# Patient Record
Sex: Female | Born: 1960 | Race: Black or African American | Hispanic: No | Marital: Married | Smoking: Never smoker
Health system: Southern US, Community
[De-identification: ages and names within clinical notes are randomized; demographics above are authoritative.]

## PROBLEM LIST (undated history)

## (undated) DIAGNOSIS — M1990 Unspecified osteoarthritis, unspecified site: Secondary | ICD-10-CM

## (undated) DIAGNOSIS — I2699 Other pulmonary embolism without acute cor pulmonale: Secondary | ICD-10-CM

## (undated) DIAGNOSIS — M25569 Pain in unspecified knee: Secondary | ICD-10-CM

## (undated) DIAGNOSIS — E669 Obesity, unspecified: Secondary | ICD-10-CM

## (undated) DIAGNOSIS — J9691 Respiratory failure, unspecified with hypoxia: Secondary | ICD-10-CM

## (undated) DIAGNOSIS — J45909 Unspecified asthma, uncomplicated: Secondary | ICD-10-CM

## (undated) DIAGNOSIS — I10 Essential (primary) hypertension: Secondary | ICD-10-CM

## (undated) DIAGNOSIS — D649 Anemia, unspecified: Secondary | ICD-10-CM

## (undated) DIAGNOSIS — E662 Morbid (severe) obesity with alveolar hypoventilation: Secondary | ICD-10-CM

## (undated) HISTORY — DX: Pain in unspecified knee: M25.569

## (undated) HISTORY — PX: TONSILLECTOMY: SUR1361

## (undated) HISTORY — DX: Obesity, unspecified: E66.9

---

## 2009-11-20 ENCOUNTER — Emergency Department (HOSPITAL_COMMUNITY): Admission: EM | Admit: 2009-11-20 | Discharge: 2009-11-20 | Payer: Self-pay | Admitting: Emergency Medicine

## 2011-04-05 ENCOUNTER — Emergency Department (HOSPITAL_COMMUNITY)
Admission: EM | Admit: 2011-04-05 | Discharge: 2011-04-05 | Disposition: A | Discharge status: Home / Self Care | Payer: Self-pay | Attending: Emergency Medicine | Admitting: Emergency Medicine

## 2011-04-05 DIAGNOSIS — H109 Unspecified conjunctivitis: Secondary | ICD-10-CM | POA: Insufficient documentation

## 2011-04-05 DIAGNOSIS — I10 Essential (primary) hypertension: Secondary | ICD-10-CM | POA: Insufficient documentation

## 2011-04-05 HISTORY — DX: Essential (primary) hypertension: I10

## 2011-04-05 MED ORDER — GENTAMICIN SULFATE 0.3 % OP SOLN: 2.0000 [drp] | Freq: Four times a day (QID) | OPHTHALMIC | Status: AC

## 2011-04-05 NOTE — ED Provider Notes (Signed)
History   This chart was scribed for Emma Lyons, MD by Clarita Crane. The patient was seen in room APFT21/APFT21 and the patient's care was started at 9:49PM.   CSN: [id number] Arrival date & time: 04/05/2011  9:36 PM   First MD Initiated Contact with Patient 04/05/11 2147      Chief Complaint  Patient presents with  . Eye Pain    (Consider location/radiation/quality/duration/timing/severity/associated sxs/prior treatment) HPI Pt comes in with right eye pain with swelling, itching and blurred vision that started one day ago. Pt has watery eye drainage. Pt wears glasses and has past history of conjunctivitis that was treated with penicillin. Denies HA, fever, chills, n/v/d. Denies h/o diabetes. Reports HTN.  Past Medical History  Diagnosis Date  . Hypertension     History reviewed. No pertinent past surgical history.  No family history on file.  History  Substance Use Topics  . Smoking status: Never Smoker   . Smokeless tobacco: Not on file  . Alcohol Use: No    OB History    Grav Para Term Preterm Abortions TAB SAB Ect Mult Living                  Review of Systems 10 Systems reviewed and are negative for acute change except as noted in the HPI.  Allergies  Review of patient's allergies indicates no known allergies.  Home Medications  No current outpatient prescriptions on file.  BP 181/107  Pulse 67  Temp(Src) 97.6 F (36.4 C) (Oral)  Resp 16  Ht 5\' 2"  (1.575 m)  Wt 231 lb 1.6 oz (104.826 kg)  BMI 42.27 kg/m2  SpO2 100%  LMP 03/05/2011  Physical Exam  Nursing note and vitals reviewed. Constitutional: She is oriented to person, place, and time. She appears well-developed and well-nourished. No distress.  HENT:  Head: Normocephalic and atraumatic.  Eyes: EOM are normal. Pupils are equal, round, and reactive to light. Left eye exhibits discharge.       Right eye conjunctiva injected. Cornea clear. Mild watery drainage noted. Anterior chamber clear.     Neck: Neck supple. No tracheal deviation present.  Cardiovascular: Normal rate.   Pulmonary/Chest: Effort normal. No respiratory distress.  Abdominal: She exhibits no distension.  Musculoskeletal: Normal range of motion. She exhibits no edema.  Neurological: She is alert and oriented to person, place, and time. No sensory deficit.  Skin: Skin is warm and dry.  Psychiatric: She has a normal mood and affect. Her behavior is normal.    ED Course  Procedures (including critical care time)  DIAGNOSTIC STUDIES: Oxygen Saturation is 100% on room air, normal by my interpretation.    COORDINATION OF CARE:    Labs Reviewed - No data to display No results found.   No diagnosis found.    MDM  Looks like mild cellulitis.  Will give drops, return prn.      I personally performed the services described in this documentation, which was scribed in my presence. The recorded information has been reviewed and considered.      Emma Lyons, MD 04/05/11 2200

## 2011-04-05 NOTE — ED Notes (Signed)
Pt presents with right eye pain and minimal swelling. No drainage noted at this time. Pt states "I can hardly see out of it".

## 2011-04-05 NOTE — ED Notes (Signed)
Right eye with blurry vision, pt states feels like something in it, mild swelling noted to right eye, + watery, denies any yellow or green drainage

## 2011-04-05 NOTE — ED Notes (Signed)
Left eye 20/50 and right eye 20/200

## 2011-04-05 NOTE — ED Notes (Signed)
MD at bedside. 

## 2012-05-26 ENCOUNTER — Emergency Department (HOSPITAL_COMMUNITY): Payer: Worker's Compensation

## 2012-05-26 ENCOUNTER — Emergency Department (HOSPITAL_COMMUNITY)
Admission: EM | Admit: 2012-05-26 | Discharge: 2012-05-26 | Disposition: A | Discharge status: Home / Self Care | Payer: Worker's Compensation | Attending: Emergency Medicine | Admitting: Emergency Medicine

## 2012-05-26 ENCOUNTER — Encounter (HOSPITAL_COMMUNITY): Payer: Self-pay | Admitting: *Deleted

## 2012-05-26 DIAGNOSIS — I10 Essential (primary) hypertension: Secondary | ICD-10-CM | POA: Insufficient documentation

## 2012-05-26 DIAGNOSIS — Y9389 Activity, other specified: Secondary | ICD-10-CM | POA: Insufficient documentation

## 2012-05-26 DIAGNOSIS — S6990XA Unspecified injury of unspecified wrist, hand and finger(s), initial encounter: Secondary | ICD-10-CM

## 2012-05-26 DIAGNOSIS — W319XXA Contact with unspecified machinery, initial encounter: Secondary | ICD-10-CM | POA: Insufficient documentation

## 2012-05-26 DIAGNOSIS — Y9289 Other specified places as the place of occurrence of the external cause: Secondary | ICD-10-CM | POA: Insufficient documentation

## 2012-05-26 DIAGNOSIS — Y990 Civilian activity done for income or pay: Secondary | ICD-10-CM | POA: Insufficient documentation

## 2012-05-26 DIAGNOSIS — S6710XA Crushing injury of unspecified finger(s), initial encounter: Secondary | ICD-10-CM | POA: Insufficient documentation

## 2012-05-26 DIAGNOSIS — S61209A Unspecified open wound of unspecified finger without damage to nail, initial encounter: Secondary | ICD-10-CM | POA: Insufficient documentation

## 2012-05-26 MED ORDER — IBUPROFEN 800 MG PO TABS: 800.0000 mg | ORAL_TABLET | Freq: Three times a day (TID) | ORAL | Status: DC

## 2012-05-26 MED ORDER — HYDROCODONE-ACETAMINOPHEN 5-325 MG PO TABS
1.0000 | ORAL_TABLET | Freq: Once | ORAL | Status: AC
  Administered 2012-05-26: 1 via ORAL
  Filled 2012-05-26: qty 1

## 2012-05-26 MED ORDER — IBUPROFEN 800 MG PO TABS
800.0000 mg | ORAL_TABLET | Freq: Once | ORAL | Status: AC
  Administered 2012-05-26: 800 mg via ORAL
  Filled 2012-05-26: qty 1

## 2012-05-26 NOTE — ED Notes (Signed)
Pt reports getting finger caught in machine belt at work approximately 1 hour ago. Laceration noted to ring finger of left hand.  Bleeding controlled at this time.  Pt reports last tetanus shot in last 2 years.

## 2012-05-26 NOTE — ED Provider Notes (Signed)
History     CSN: [id number]  Arrival date & time 05/26/12  0054   First MD Initiated Contact with Patient 05/26/12 0236      Chief Complaint  Patient presents with  . Finger Injury    (Consider location/radiation/quality/duration/timing/severity/associated sxs/prior treatment) HPI LISVET Emma Morris is a 52 y.o. female who presents to the Emergency Department complaining of right ring  finger injury while at work.She caught the finger in a press crushing the end of the right 4th finger. Circumferential shallow laceration to the tip of the finger, suturing  not appropriate.Tetanus up to date 2 years ago. Has taken no medicines.  Past Medical History  Diagnosis Date  . Hypertension     History reviewed. No pertinent past surgical history.  History reviewed. No pertinent family history.  History  Substance Use Topics  . Smoking status: Never Smoker   . Smokeless tobacco: Not on file  . Alcohol Use: No    OB History    Grav Para Term Preterm Abortions TAB SAB Ect Mult Living                  Review of Systems  Constitutional: Negative for fever.       10 Systems reviewed and are negative for acute change except as noted in the HPI.  HENT: Negative for congestion.   Eyes: Negative for discharge and redness.  Respiratory: Negative for cough and shortness of breath.   Cardiovascular: Negative for chest pain.  Gastrointestinal: Negative for vomiting and abdominal pain.  Musculoskeletal: Negative for back pain.       Right 4th finger injury  Skin: Negative for rash.  Neurological: Negative for syncope, numbness and headaches.  Psychiatric/Behavioral:       No behavior change.    Allergies  Review of patient's allergies indicates no known allergies.  Home Medications   Current Outpatient Rx  Name  Route  Sig  Dispense  Refill  . CLONIDINE HCL 0.2 MG PO TABS   Oral   Take 0.2 mg by mouth daily.             BP 183/103  Pulse 86  Temp 98.1 F (36.7 C)  (Oral)  Resp 18  Ht 5\' 2"  (1.575 m)  Wt 189 lb (85.73 kg)  BMI 34.57 kg/m2  SpO2 99%  LMP 05/01/2012  Physical Exam  Nursing note and vitals reviewed. Constitutional: She appears well-developed and well-nourished.       Awake, alert, nontoxic appearance.  HENT:  Head: Atraumatic.  Eyes: Right eye exhibits no discharge. Left eye exhibits no discharge.  Neck: Neck supple.  Cardiovascular: Normal heart sounds.   Pulmonary/Chest: Effort normal and breath sounds normal. She exhibits no tenderness.  Abdominal: Soft. There is no tenderness. There is no rebound.  Musculoskeletal: She exhibits no tenderness.       Hands:      Baseline ROM, no obvious new focal weakness.Right 4th finger with a circular laceration to the tip end of the finger associated with a crush injury.   Neurological:       Mental status and motor strength appears baseline for patient and situation.  Skin: No rash noted.  Psychiatric: She has a normal mood and affect.    ED Course  Procedures (including critical care time)  Dg Finger Ring Right  05/26/2012  *RADIOLOGY REPORT*  Clinical Data: Injury, pain.  RIGHT RING FINGER 2+V  Comparison: None.  Findings: Imaged bones, joints and soft tissues appear normal.  IMPRESSION: Normal study.   Original Report Authenticated By: Holley Dexter, M.D.        MDM  Patient presents with crush inury to right 4th finger with a circular laceration to the tip end of the finger that does not require repair. Bulky wrap applied. [employer] paperwork completed. Pt stable in ED with no significant deterioration in condition.The patient appears reasonably screened and/or stabilized for discharge and I doubt any other medical condition or other [facility] requiring further screening, evaluation, or treatment in the ED at this time prior to discharge.  MDM Reviewed: nursing note and vitals Interpretation: x-ray           Nicoletta Dress. Colon Branch, MD 05/26/12 1610

## 2012-05-26 NOTE — ED Notes (Signed)
Finger cleaned w/ shur clens. Bulky dressing applied as per EDP.

## 2013-05-02 ENCOUNTER — Emergency Department (HOSPITAL_COMMUNITY): Payer: BC Managed Care – PPO

## 2013-05-02 ENCOUNTER — Encounter (HOSPITAL_COMMUNITY): Payer: Self-pay | Admitting: Emergency Medicine

## 2013-05-02 ENCOUNTER — Inpatient Hospital Stay (HOSPITAL_COMMUNITY)
Admission: EM | Admit: 2013-05-02 | Discharge: 2013-05-04 | DRG: 743 | Disposition: A | Discharge status: Home / Self Care | Payer: BC Managed Care – PPO | Attending: Obstetrics & Gynecology | Admitting: Obstetrics & Gynecology

## 2013-05-02 ENCOUNTER — Observation Stay (HOSPITAL_COMMUNITY): Payer: BC Managed Care – PPO

## 2013-05-02 DIAGNOSIS — N946 Dysmenorrhea, unspecified: Secondary | ICD-10-CM | POA: Diagnosis present

## 2013-05-02 DIAGNOSIS — D500 Iron deficiency anemia secondary to blood loss (chronic): Secondary | ICD-10-CM | POA: Diagnosis present

## 2013-05-02 DIAGNOSIS — N939 Abnormal uterine and vaginal bleeding, unspecified: Secondary | ICD-10-CM | POA: Diagnosis present

## 2013-05-02 DIAGNOSIS — N921 Excessive and frequent menstruation with irregular cycle: Secondary | ICD-10-CM | POA: Diagnosis present

## 2013-05-02 DIAGNOSIS — Z9889 Other specified postprocedural states: Secondary | ICD-10-CM

## 2013-05-02 DIAGNOSIS — I10 Essential (primary) hypertension: Secondary | ICD-10-CM | POA: Diagnosis present

## 2013-05-02 DIAGNOSIS — R109 Unspecified abdominal pain: Secondary | ICD-10-CM

## 2013-05-02 DIAGNOSIS — N920 Excessive and frequent menstruation with regular cycle: Principal | ICD-10-CM | POA: Diagnosis present

## 2013-05-02 DIAGNOSIS — K8020 Calculus of gallbladder without cholecystitis without obstruction: Secondary | ICD-10-CM

## 2013-05-02 DIAGNOSIS — D649 Anemia, unspecified: Secondary | ICD-10-CM

## 2013-05-02 LAB — ABO/RH: ABO/RH(D): O POS

## 2013-05-02 LAB — PREPARE RBC (CROSSMATCH)

## 2013-05-02 LAB — COMPREHENSIVE METABOLIC PANEL
Calcium: 8.2 mg/dL — ABNORMAL LOW (ref 8.4–10.5)
Creatinine, Ser: 0.8 mg/dL (ref 0.50–1.10)
GFR calc Af Amer: >90 mL/min (ref >90)
GFR calc non Af Amer: 83 mL/min — ABNORMAL LOW (ref >90)
Glucose, Bld: 97 mg/dL (ref 70–99)
Potassium: 3.2 mEq/L — ABNORMAL LOW (ref 3.5–5.1)
Sodium: 134 mEq/L — ABNORMAL LOW (ref 135–145)
Total Bilirubin: 0.2 mg/dL — ABNORMAL LOW (ref 0.3–1.2)
Total Protein: 6.8 g/dL (ref 6.0–8.3)

## 2013-05-02 LAB — URINALYSIS, ROUTINE W REFLEX MICROSCOPIC
Bilirubin Urine: NEGATIVE
Glucose, UA: NEGATIVE mg/dL
Hgb urine dipstick: NEGATIVE
Leukocytes, UA: NEGATIVE
Protein, ur: NEGATIVE mg/dL
Specific Gravity, Urine: 1.025 (ref 1.005–1.030)
Urobilinogen, UA: 0.2 mg/dL (ref 0.0–1.0)
pH: 6 (ref 5.0–8.0)

## 2013-05-02 LAB — CBC WITH DIFFERENTIAL/PLATELET
Basophils Absolute: 0 10*3/uL (ref 0.0–0.1)
Basophils Relative: 0 % (ref 0–1)
Eosinophils Relative: 4 % (ref 0–5)
HCT: 15.7 % — ABNORMAL LOW (ref 36.0–46.0)
MCH: 23.6 pg — ABNORMAL LOW (ref 26.0–34.0)
MCV: 78.9 fL (ref 78.0–100.0)
Monocytes Relative: 8 % (ref 3–12)
Platelets: 291 10*3/uL (ref 150–400)
RBC: 1.99 MIL/uL — ABNORMAL LOW (ref 3.87–5.11)
RDW: 16.6 % — ABNORMAL HIGH (ref 11.5–15.5)
WBC: 7.4 10*3/uL (ref 4.0–10.5)

## 2013-05-02 LAB — CBC
HCT: 23.3 % — ABNORMAL LOW (ref 36.0–46.0)
Hemoglobin: 7.5 g/dL — ABNORMAL LOW (ref 12.0–15.0)
MCH: 26 pg (ref 26.0–34.0)
MCHC: 32.2 g/dL (ref 30.0–36.0)
RBC: 2.88 MIL/uL — ABNORMAL LOW (ref 3.87–5.11)

## 2013-05-02 LAB — PRO B NATRIURETIC PEPTIDE: Pro B Natriuretic peptide (BNP): 90.2 pg/mL (ref 0–125)

## 2013-05-02 LAB — LIPASE, BLOOD: Lipase: 22 U/L (ref 11–59)

## 2013-05-02 MED ORDER — KCL IN DEXTROSE-NACL 40-5-0.45 MEQ/L-%-% IV SOLN
INTRAVENOUS | Status: DC
  Administered 2013-05-02 – 2013-05-03 (×3): via INTRAVENOUS

## 2013-05-02 MED ORDER — FUROSEMIDE 10 MG/ML IJ SOLN
20.0000 mg | Freq: Once | INTRAMUSCULAR | Status: AC
  Administered 2013-05-02: 20 mg via INTRAVENOUS
  Filled 2013-05-02: qty 2

## 2013-05-02 MED ORDER — ONDANSETRON HCL 4 MG/2ML IJ SOLN
4.0000 mg | Freq: Once | INTRAMUSCULAR | Status: AC
  Administered 2013-05-02: 4 mg via INTRAVENOUS
  Filled 2013-05-02: qty 2

## 2013-05-02 MED ORDER — HYDROMORPHONE HCL PF 1 MG/ML IJ SOLN
0.5000 mg | Freq: Once | INTRAMUSCULAR | Status: AC
  Administered 2013-05-02: 0.5 mg via INTRAVENOUS
  Filled 2013-05-02: qty 1

## 2013-05-02 MED ORDER — ACETAMINOPHEN 325 MG PO TABS
650.0000 mg | ORAL_TABLET | Freq: Once | ORAL | Status: AC
  Administered 2013-05-02: 650 mg via ORAL
  Filled 2013-05-02: qty 2

## 2013-05-02 MED ORDER — IOHEXOL 300 MG/ML  SOLN
100.0000 mL | Freq: Once | INTRAMUSCULAR | Status: AC | PRN
  Administered 2013-05-02: 100 mL via INTRAVENOUS

## 2013-05-02 MED ORDER — IOHEXOL 300 MG/ML  SOLN
50.0000 mL | Freq: Once | INTRAMUSCULAR | Status: AC | PRN
  Administered 2013-05-02: 50 mL via ORAL

## 2013-05-02 MED ORDER — SODIUM CHLORIDE 0.9 % IV SOLN
INTRAVENOUS | Status: AC
  Administered 2013-05-02: 11:00:00 via INTRAVENOUS

## 2013-05-02 MED ORDER — SODIUM CHLORIDE 0.9 % IV SOLN
INTRAVENOUS | Status: DC
  Administered 2013-05-02: 1000 mL via INTRAVENOUS

## 2013-05-02 MED ORDER — MEGESTROL ACETATE 40 MG PO TABS
120.0000 mg | ORAL_TABLET | Freq: Every day | ORAL | Status: DC
  Administered 2013-05-02 – 2013-05-03 (×2): 120 mg via ORAL
  Filled 2013-05-02 (×6): qty 3

## 2013-05-02 NOTE — Progress Notes (Signed)
Pt has been drowsy but arousable this afternoon. Woke pt up about three times while she was receiving blood and asked her orientation questions. Pt was able to answer all questions correctly. Will continue to monitor.

## 2013-05-02 NOTE — ED Provider Notes (Addendum)
CSN: [id number]     Arrival date & time 05/02/13  0207 History   First MD Initiated Contact with Patient 05/02/13 0236     Chief Complaint  Patient presents with  . Back Pain  . Leg Swelling   (Consider location/radiation/quality/duration/timing/severity/associated sxs/prior Treatment) Patient is a 52 y.o. female presenting with back pain. The history is provided by the patient.  Back Pain Associated symptoms: abdominal pain   Associated symptoms: no chest pain, no dysuria, no fever and no headaches    patient presents with a four-week history of left-sided abdominal pain is suprapubic abdominal pain. No nausea no vomiting associated with shortness of breath. Patient states the pain is 8/10 it has been waxing and waning over the past month. Associated with vaginal bleeding for the past 5 weeks. Patient has been seen at [facility] to clinic for this. Patient denies being started on any hormone manipulation treatment for the vaginal bleeding.  Past Medical History  Diagnosis Date  . Hypertension    History reviewed. No pertinent past surgical history. No family history on file. History  Substance Use Topics  . Smoking status: Never Smoker   . Smokeless tobacco: Not on file  . Alcohol Use: No   OB History   Grav Para Term Preterm Abortions TAB SAB Ect Mult Living                 Review of Systems  Constitutional: Negative for fever.  HENT: Negative for congestion.   Eyes: Negative for redness.  Respiratory: Positive for shortness of breath.   Cardiovascular: Positive for leg swelling. Negative for chest pain.  Gastrointestinal: Positive for abdominal pain.  Genitourinary: Positive for vaginal bleeding. Negative for dysuria.  Musculoskeletal: Positive for back pain.  Skin: Negative for rash.  Neurological: Negative for headaches.  Hematological: Does not bruise/bleed easily.  Psychiatric/Behavioral: Negative for confusion.    Allergies  Review of patient's allergies indicates no  known allergies.  Home Medications   Current Outpatient Rx  Name  Route  Sig  Dispense  Refill  . cloNIDine (CATAPRES) 0.2 MG tablet   Oral   Take 0.2 mg by mouth daily.           Marland Kitchen ibuprofen (ADVIL,MOTRIN) 800 MG tablet   Oral   Take 1 tablet (800 mg total) by mouth 3 (three) times daily.   21 tablet   0    BP 138/75  Pulse 99  Temp(Src) 98.3 F (36.8 C) (Oral)  Resp 16  Ht 5\' 2"  (1.575 m)  Wt 251 lb (113.853 kg)  BMI 45.90 kg/m2  SpO2 100%  LMP 04/24/2013 Physical Exam  Nursing note and vitals reviewed. Constitutional: She is oriented to person, place, and time. She appears well-developed and well-nourished.  HENT:  Head: Normocephalic and atraumatic.  Mouth/Throat: Oropharynx is clear and moist.  Eyes: Conjunctivae and EOM are normal. Pupils are equal, round, and reactive to light.  Cardiovascular: Normal rate, regular rhythm and normal heart sounds.   No murmur heard. Pulmonary/Chest: Effort normal and breath sounds normal. No respiratory distress.  Abdominal: Bowel sounds are normal. There is no tenderness.  Genitourinary:  Vaginal bleeding.  Musculoskeletal: Normal range of motion. She exhibits edema.  Neurological: She is alert and oriented to person, place, and time. No cranial nerve deficit. She exhibits normal muscle tone. Coordination normal.  Skin: No rash noted.    ED Course  Procedures (including critical care time) Labs Review Labs Reviewed  COMPREHENSIVE METABOLIC PANEL - Abnormal; Notable  for the following:    Sodium 134 (*)    Potassium 3.2 (*)    Calcium 8.2 (*)    Albumin 3.0 (*)    Total Bilirubin 0.2 (*)    GFR calc non Af Amer 83 (*)    All other components within normal limits  CBC WITH DIFFERENTIAL - Abnormal; Notable for the following:    RBC 1.99 (*)    Hemoglobin 4.7 (*)    HCT 15.7 (*)    MCH 23.6 (*)    MCHC 29.9 (*)    RDW 16.6 (*)    All other components within normal limits  LIPASE, BLOOD  URINALYSIS, ROUTINE W  REFLEX MICROSCOPIC  PRO B NATRIURETIC PEPTIDE  TROPONIN I  TYPE AND SCREEN  PREPARE RBC (CROSSMATCH)  ABO/RH   Results for orders placed during the hospital encounter of 05/02/13  COMPREHENSIVE METABOLIC PANEL      Result Value Range   Sodium 134 (*) 135 - 145 mEq/L   Potassium 3.2 (*) 3.5 - 5.1 mEq/L   Chloride 99  96 - 112 mEq/L   CO2 27  19 - 32 mEq/L   Glucose, Bld 97  70 - 99 mg/dL   BUN 9  6 - 23 mg/dL   Creatinine, Ser 3.08  0.50 - 1.10 mg/dL   Calcium 8.2 (*) 8.4 - 10.5 mg/dL   Total Protein 6.8  6.0 - 8.3 g/dL   Albumin 3.0 (*) 3.5 - 5.2 g/dL   AST 8  0 - 37 U/L   ALT 9  0 - 35 U/L   Alkaline Phosphatase 39  39 - 117 U/L   Total Bilirubin 0.2 (*) 0.3 - 1.2 mg/dL   GFR calc non Af Amer 83 (*) >90 mL/min   GFR calc Af Amer >90  >90 mL/min  LIPASE, BLOOD      Result Value Range   Lipase 22  11 - 59 U/L  CBC WITH DIFFERENTIAL      Result Value Range   WBC 7.4  4.0 - 10.5 K/uL   RBC 1.99 (*) 3.87 - 5.11 MIL/uL   Hemoglobin 4.7 (*) 12.0 - 15.0 g/dL   HCT 65.7 (*) 84.6 - 96.2 %   MCV 78.9  78.0 - 100.0 fL   MCH 23.6 (*) 26.0 - 34.0 pg   MCHC 29.9 (*) 30.0 - 36.0 g/dL   RDW 95.2 (*) 84.1 - 32.4 %   Platelets 291  150 - 400 K/uL   Neutrophils Relative % 65  43 - 77 %   Neutro Abs 4.9  1.7 - 7.7 K/uL   Lymphocytes Relative 23  12 - 46 %   Lymphs Abs 1.7  0.7 - 4.0 K/uL   Monocytes Relative 8  3 - 12 %   Monocytes Absolute 0.6  0.1 - 1.0 K/uL   Eosinophils Relative 4  0 - 5 %   Eosinophils Absolute 0.3  0.0 - 0.7 K/uL   Basophils Relative 0  0 - 1 %   Basophils Absolute 0.0  0.0 - 0.1 K/uL  URINALYSIS, ROUTINE W REFLEX MICROSCOPIC      Result Value Range   Color, Urine YELLOW  YELLOW   APPearance CLEAR  CLEAR   Specific Gravity, Urine 1.025  1.005 - 1.030   pH 6.0  5.0 - 8.0   Glucose, UA NEGATIVE  NEGATIVE mg/dL   Hgb urine dipstick NEGATIVE  NEGATIVE   Bilirubin Urine NEGATIVE  NEGATIVE   Ketones, ur NEGATIVE  NEGATIVE mg/dL   Protein, ur NEGATIVE  NEGATIVE  mg/dL   Urobilinogen, UA 0.2  0.0 - 1.0 mg/dL   Nitrite NEGATIVE  NEGATIVE   Leukocytes, UA NEGATIVE  NEGATIVE  PRO B NATRIURETIC PEPTIDE      Result Value Range   Pro B Natriuretic peptide (BNP) 90.2  0 - 125 pg/mL  TROPONIN I      Result Value Range   Troponin I <0.30  <0.30 ng/mL  TYPE AND SCREEN      Result Value Range   ABO/RH(D) O POS     Antibody Screen NEG     Sample Expiration 05/05/2013     Unit Number [id number]     Blood Component Type RBC LR PHER2     Unit division 00     Status of Unit ALLOCATED     Transfusion Status OK TO TRANSFUSE     Crossmatch Result Compatible     Unit Number [id number]     Blood Component Type RED CELLS,LR     Unit division 00     Status of Unit ALLOCATED     Transfusion Status OK TO TRANSFUSE     Crossmatch Result Compatible    PREPARE RBC (CROSSMATCH)      Result Value Range   Order Confirmation ORDER PROCESSED BY BLOOD BANK    ABO/RH      Result Value Range   ABO/RH(D) O POS      Imaging Review Dg Chest 2 View  05/02/2013   CLINICAL DATA:  Back pain and leg swelling.  EXAM: CHEST  2 VIEW  COMPARISON:  None available for comparison at time of study interpretation.  FINDINGS: The cardiac silhouette appears mildly enlarged, mediastinal silhouette is unremarkable. Low inspiratory examination with crowded vasculature markings, no superimposed pleural effusions or focal consolidations. No pneumothorax. Large body habitus. Osseous structures are nonsuspicious.  IMPRESSION: Mild cardiomegaly, no acute pulmonary process for this low inspiratory examination.   Electronically Signed   By: Awilda Metro   On: 05/02/2013 06:36   Ct Abdomen Pelvis W Contrast  05/02/2013   CLINICAL DATA:  Left abdominal pain with legs  EXAM: CT ABDOMEN AND PELVIS WITH CONTRAST  TECHNIQUE: Multidetector CT imaging of the abdomen and pelvis was performed using the standard protocol following bolus administration of intravenous contrast.  CONTRAST:  50mL  OMNIPAQUE IOHEXOL 300 MG/ML SOLN, OMNIPAQUE IOHEXOL 300 MG/ML SOLN  COMPARISON:  None.  FINDINGS: BODY WALL: Unremarkable.  LOWER CHEST: Probable mild air trapping at the bases were there is mosaic attenuation.  ABDOMEN/PELVIS:  Liver: No focal abnormality.  Biliary: Gallstones without evidence of acute cholecystitis.  Pancreas: Unremarkable.  Spleen: Unremarkable.  Adrenals: Unremarkable.  Kidneys and ureters: No hydronephrosis or stone.  Bladder: Unremarkable.  Reproductive: The right ovary is larger than the left, measuring 3 x 3.8 by 3.8 cm. No discrete cyst or mass seen.  Bowel: The colon is moderately distended at multiple locations with gas and stool. The sigmoid colon is redundant and partially twisted, without current volvulus or obstruction. No pericecal inflammation.  Retroperitoneum: No mass or adenopathy.  Peritoneum: No free fluid or gas.  Vascular: No acute abnormality.  OSSEOUS: No acute abnormalities.  IMPRESSION: 1. No acute intra-abdominal findings. 2. Gas and stool-filled colon which could be a source of left lower quadrant pain. Redundant, tortuous sigmoid colon which places the patient at risk for volvulus. 3. Cholelithiasis without acute cholecystitis. 4. Prominent right ovarian size for age, 3 x 4 x 4  cm. If the patient is postmenopausal, recommend outpatient followup sonography.   Electronically Signed   By: Tiburcio Pea M.D.   On: 05/02/2013 06:50    EKG Interpretation    Date/Time:  Wednesday May 02 2013 03:04:48 EST Ventricular Rate:  90 PR Interval:  148 QRS Duration: 72 QT Interval:  366 QTC Calculation: 447 R Axis:   5 Text Interpretation:  Normal sinus rhythm Normal ECG No previous ECGs available Confirmed by Yanky Vanderburg  MD, Aleister Lady ([id number]) on 05/02/2013 3:37:50 AM           CRITICAL CARE Performed by: Shelda Jakes. Total critical care time: 30 Critical care time was exclusive of separately billable procedures and treating other  patients. Critical care was necessary to treat or prevent imminent or life-threatening deterioration. Critical care was time spent personally by me on the following activities: development of treatment plan with patient and/or surrogate as well as nursing, discussions with consultants, evaluation of patient's response to treatment, examination of patient, obtaining history from patient or surrogate, ordering and performing treatments and interventions, ordering and review of laboratory studies, ordering and review of radiographic studies, pulse oximetry and re-evaluation of patient's condition.  MDM   1. Abdominal pain   2. Anemia   3. Vaginal bleeding   4. Cholelithiasis    Patient with marked anemia. Most likely due to the vaginal bleeding for the past 5 weeks. CT scan done to evaluate intra-abdominal processes and 4 the left abdominal pain.  Patient will require admission for the significant anemia also requires blood transfusion. Transfusion orders begun. Patient may need hormonal manipulation an OB/GYN consultation while in the hospital. If the CT scan is negative would recommend starting Megace at 40 mg. Once a day.    Shelda Jakes, MD 05/02/13 1610  Shelda Jakes, MD 05/02/13 [id number]  Shelda Jakes, MD 05/02/13 [phone]  Addendum: Discuss with Dr. Despina Hidden from OB/GYN. He will admit to MedSurg. We'll plan on probably getting an ultrasound. Admission by hospitalist not required.  Shelda Jakes, MD 05/02/13 [phone]

## 2013-05-02 NOTE — ED Notes (Signed)
Blood ready

## 2013-05-02 NOTE — ED Notes (Signed)
Pt reports left side/lower back pain that has been going on for a month. Pt states her legs are swelling also.

## 2013-05-03 DIAGNOSIS — N921 Excessive and frequent menstruation with irregular cycle: Secondary | ICD-10-CM | POA: Diagnosis present

## 2013-05-03 DIAGNOSIS — N949 Unspecified condition associated with female genital organs and menstrual cycle: Secondary | ICD-10-CM

## 2013-05-03 DIAGNOSIS — D500 Iron deficiency anemia secondary to blood loss (chronic): Secondary | ICD-10-CM

## 2013-05-03 LAB — CBC
HCT: 26.3 % — ABNORMAL LOW (ref 36.0–46.0)
Hemoglobin: 8.5 g/dL — ABNORMAL LOW (ref 12.0–15.0)
MCH: 26.3 pg (ref 26.0–34.0)
MCHC: 32.3 g/dL (ref 30.0–36.0)
MCV: 81.4 fL (ref 78.0–100.0)
Platelets: 247 K/uL (ref 150–400)
RBC: 3.23 MIL/uL — ABNORMAL LOW (ref 3.87–5.11)
RDW: 16.6 % — ABNORMAL HIGH (ref 11.5–15.5)
WBC: 8.8 K/uL (ref 4.0–10.5)

## 2013-05-03 LAB — PREPARE RBC (CROSSMATCH)

## 2013-05-03 MED ORDER — ACETAMINOPHEN 325 MG PO TABS
650.0000 mg | ORAL_TABLET | Freq: Once | ORAL | Status: AC
Start: 2013-05-03 — End: 2013-05-03
  Administered 2013-05-03: 650 mg via ORAL
  Filled 2013-05-03: qty 2

## 2013-05-03 MED ORDER — CEFAZOLIN SODIUM-DEXTROSE 2-3 GM-% IV SOLR
2.0000 g | INTRAVENOUS | Status: AC
  Administered 2013-05-04: 2 g via INTRAVENOUS
  Filled 2013-05-03: qty 50

## 2013-05-03 MED ORDER — CEFAZOLIN (ANCEF) 1 G IV SOLR: 2.0000 g | INTRAVENOUS | Status: DC

## 2013-05-03 MED ORDER — DIPHENHYDRAMINE HCL 25 MG PO CAPS
25.0000 mg | ORAL_CAPSULE | Freq: Once | ORAL | Status: AC
  Administered 2013-05-03: 25 mg via ORAL
  Filled 2013-05-03: qty 1

## 2013-05-03 MED ORDER — KETOROLAC TROMETHAMINE 30 MG/ML IJ SOLN
30.0000 mg | Freq: Once | INTRAMUSCULAR | Status: AC
  Administered 2013-05-04: 30 mg via INTRAVENOUS
  Filled 2013-05-03: qty 1

## 2013-05-03 MED ORDER — FUROSEMIDE 10 MG/ML IJ SOLN
20.0000 mg | Freq: Once | INTRAMUSCULAR | Status: AC
  Administered 2013-05-03: 20 mg via INTRAVENOUS
  Filled 2013-05-03: qty 2

## 2013-05-03 NOTE — Progress Notes (Signed)
Notified MD of hemoglobin.  Will follow MD's orders and give 1 unit of blood and have a CBC completed in the AM.  Will continue to monitor patient.   General Wearing R

## 2013-05-03 NOTE — Progress Notes (Addendum)
Pt has FMLA papers and doctor's note in chart that need to be signed by MD. Dr. Despina Hidden made aware. Her job needs them to be faxed over ASAP. Fax number should be on the forms. Will continue to monitor.

## 2013-05-03 NOTE — Progress Notes (Signed)
Pt. Has been alert and oriented and visiting with family.  Family brought food in for patient and patient ate 100% of her meal.  Pt. States that she has more energy and is feeling better.  Will continue to monitor.  Meria Crilly R

## 2013-05-03 NOTE — Progress Notes (Signed)
UR completed 

## 2013-05-03 NOTE — Progress Notes (Signed)
Subjective: Patient reports feeling better, bleeding lighter.    Objective: I have reviewed patient's vital signs, intake and output, medications and labs.  Results for orders placed during the hospital encounter of 05/02/13 (from the past 24 hour(s))  CBC   Collection Time    05/02/13  8:22 PM      Result Value Range   WBC 8.2  4.0 - 10.5 K/uL   RBC 2.88 (*) 3.87 - 5.11 MIL/uL   Hemoglobin 7.5 (*) 12.0 - 15.0 g/dL   HCT 11.9 (*) 14.7 - 82.9 %   MCV 80.9  78.0 - 100.0 fL   MCH 26.0  26.0 - 34.0 pg   MCHC 32.2  30.0 - 36.0 g/dL   RDW 56.2 (*) 13.0 - 86.5 %   Platelets 244  150 - 400 K/uL  CBC   Collection Time    05/03/13  5:42 AM      Result Value Range   WBC 8.8  4.0 - 10.5 K/uL   RBC 3.23 (*) 3.87 - 5.11 MIL/uL   Hemoglobin 8.5 (*) 12.0 - 15.0 g/dL   HCT 78.4 (*) 69.6 - 29.5 %   MCV 81.4  78.0 - 100.0 fL   MCH 26.3  26.0 - 34.0 pg   MCHC 32.3  30.0 - 36.0 g/dL   RDW 28.4 (*) 13.2 - 44.0 %   Platelets 247  150 - 400 K/uL     General: alert, cooperative and no distress GI: soft, non-tender; bowel sounds normal; no masses,  no organomegaly   Assessment/Plan: Menometrorrhagia and anemia  Hysteroscopy curettage endometrial ablation tomorrow   LOS: 1 day    Emma Morris H 05/03/2013, 11:59 AM

## 2013-05-04 ENCOUNTER — Encounter (HOSPITAL_COMMUNITY): Payer: BC Managed Care – PPO | Admitting: Anesthesiology

## 2013-05-04 ENCOUNTER — Encounter (HOSPITAL_COMMUNITY)
Admission: EM | Disposition: A | Discharge status: Home / Self Care | Payer: Self-pay | Source: Home / Self Care | Attending: Obstetrics & Gynecology

## 2013-05-04 ENCOUNTER — Inpatient Hospital Stay (HOSPITAL_COMMUNITY): Payer: BC Managed Care – PPO | Admitting: Anesthesiology

## 2013-05-04 ENCOUNTER — Encounter (HOSPITAL_COMMUNITY): Payer: Self-pay | Admitting: *Deleted

## 2013-05-04 HISTORY — PX: DILITATION & CURRETTAGE/HYSTROSCOPY WITH THERMACHOICE ABLATION: SHX5569

## 2013-05-04 LAB — SURGICAL PCR SCREEN: MRSA, PCR: NEGATIVE

## 2013-05-04 SURGERY — DILATATION & CURETTAGE/HYSTEROSCOPY WITH THERMACHOICE ABLATION
Anesthesia: General | Site: Uterus

## 2013-05-04 MED ORDER — GLYCOPYRROLATE 0.2 MG/ML IJ SOLN
0.2000 mg | Freq: Once | INTRAMUSCULAR | Status: AC
  Administered 2013-05-04: 0.2 mg via INTRAVENOUS

## 2013-05-04 MED ORDER — FENTANYL CITRATE 0.05 MG/ML IJ SOLN
25.0000 ug | INTRAMUSCULAR | Status: DC | PRN
  Administered 2013-05-04 (×2): 50 ug via INTRAVENOUS
  Administered 2013-05-04: 25 ug via INTRAVENOUS
  Filled 2013-05-04 (×2): qty 2

## 2013-05-04 MED ORDER — PROPOFOL 10 MG/ML IV BOLUS
INTRAVENOUS | Status: AC
  Filled 2013-05-04: qty 20

## 2013-05-04 MED ORDER — ONDANSETRON HCL 4 MG/2ML IJ SOLN
4.0000 mg | Freq: Once | INTRAMUSCULAR | Status: AC
  Administered 2013-05-04: 4 mg via INTRAVENOUS

## 2013-05-04 MED ORDER — FENTANYL CITRATE 0.05 MG/ML IJ SOLN
INTRAMUSCULAR | Status: AC
  Filled 2013-05-04: qty 2

## 2013-05-04 MED ORDER — 0.9 % SODIUM CHLORIDE (POUR BTL) OPTIME
TOPICAL | Status: DC | PRN
  Administered 2013-05-04: 1000 mL

## 2013-05-04 MED ORDER — HYDRALAZINE HCL 20 MG/ML IJ SOLN
5.0000 mg | Freq: Once | INTRAMUSCULAR | Status: AC
  Administered 2013-05-04: 5 mg via INTRAVENOUS

## 2013-05-04 MED ORDER — ONDANSETRON HCL 4 MG/2ML IJ SOLN
INTRAMUSCULAR | Status: AC
  Filled 2013-05-04: qty 2

## 2013-05-04 MED ORDER — ONDANSETRON HCL 8 MG PO TABS: 8.0000 mg | ORAL_TABLET | Freq: Three times a day (TID) | ORAL | Status: DC | PRN

## 2013-05-04 MED ORDER — KETOROLAC TROMETHAMINE 10 MG PO TABS: 10.0000 mg | ORAL_TABLET | Freq: Three times a day (TID) | ORAL | Status: DC | PRN

## 2013-05-04 MED ORDER — NEOSTIGMINE METHYLSULFATE 1 MG/ML IJ SOLN
INTRAMUSCULAR | Status: DC | PRN
  Administered 2013-05-04: 1 mg via INTRAVENOUS

## 2013-05-04 MED ORDER — GLYCOPYRROLATE 0.2 MG/ML IJ SOLN
INTRAMUSCULAR | Status: AC
  Filled 2013-05-04: qty 1

## 2013-05-04 MED ORDER — FENTANYL CITRATE 0.05 MG/ML IJ SOLN
INTRAMUSCULAR | Status: DC | PRN
  Administered 2013-05-04 (×2): 50 ug via INTRAVENOUS

## 2013-05-04 MED ORDER — LIDOCAINE HCL 1 % IJ SOLN
INTRAMUSCULAR | Status: DC | PRN
  Administered 2013-05-04: 30 mg via INTRADERMAL

## 2013-05-04 MED ORDER — ALBUTEROL SULFATE HFA 108 (90 BASE) MCG/ACT IN AERS
INHALATION_SPRAY | RESPIRATORY_TRACT | Status: DC | PRN
  Administered 2013-05-04: 2 via RESPIRATORY_TRACT

## 2013-05-04 MED ORDER — FENTANYL CITRATE 0.05 MG/ML IJ SOLN
25.0000 ug | INTRAMUSCULAR | Status: AC
  Administered 2013-05-04: 25 ug via INTRAVENOUS

## 2013-05-04 MED ORDER — OXYCODONE-ACETAMINOPHEN 5-325 MG PO TABS: 1.0000 | ORAL_TABLET | Freq: Four times a day (QID) | ORAL | Status: DC | PRN

## 2013-05-04 MED ORDER — MIDAZOLAM HCL 2 MG/2ML IJ SOLN
1.0000 mg | INTRAMUSCULAR | Status: DC | PRN
  Administered 2013-05-04: 2 mg via INTRAVENOUS

## 2013-05-04 MED ORDER — LACTATED RINGERS IV SOLN
INTRAVENOUS | Status: DC
  Administered 2013-05-04: 1000 mL via INTRAVENOUS
  Administered 2013-05-04: 15:00:00 via INTRAVENOUS

## 2013-05-04 MED ORDER — PROPOFOL 10 MG/ML IV BOLUS
INTRAVENOUS | Status: DC | PRN
  Administered 2013-05-04: 150 mg via INTRAVENOUS
  Administered 2013-05-04: 50 mg via INTRAVENOUS
  Administered 2013-05-04: 100 mg via INTRAVENOUS

## 2013-05-04 MED ORDER — HYDRALAZINE HCL 20 MG/ML IJ SOLN
INTRAMUSCULAR | Status: AC
  Filled 2013-05-04: qty 1

## 2013-05-04 MED ORDER — GLYCOPYRROLATE 0.2 MG/ML IJ SOLN
INTRAMUSCULAR | Status: DC | PRN
  Administered 2013-05-04: .6 mg via INTRAVENOUS
  Administered 2013-05-04: .2 mg via INTRAVENOUS

## 2013-05-04 MED ORDER — DEXTROSE 5 % IV SOLN
INTRAVENOUS | Status: DC | PRN
  Administered 2013-05-04: 500 mL

## 2013-05-04 MED ORDER — GLYCOPYRROLATE 0.2 MG/ML IJ SOLN
INTRAMUSCULAR | Status: AC
  Filled 2013-05-04: qty 3

## 2013-05-04 MED ORDER — ONDANSETRON HCL 4 MG/2ML IJ SOLN
4.0000 mg | Freq: Once | INTRAMUSCULAR | Status: AC | PRN
  Administered 2013-05-04: 4 mg via INTRAVENOUS
  Filled 2013-05-04: qty 2

## 2013-05-04 MED ORDER — SODIUM CHLORIDE 0.9 % IR SOLN
Status: DC | PRN
  Administered 2013-05-04: 1000 mL

## 2013-05-04 MED ORDER — SUCCINYLCHOLINE CHLORIDE 20 MG/ML IJ SOLN
INTRAMUSCULAR | Status: DC | PRN
  Administered 2013-05-04: 120 mg via INTRAVENOUS

## 2013-05-04 MED ORDER — NEOSTIGMINE METHYLSULFATE 1 MG/ML IJ SOLN
INTRAMUSCULAR | Status: AC
  Filled 2013-05-04: qty 1

## 2013-05-04 MED ORDER — MIDAZOLAM HCL 2 MG/2ML IJ SOLN
INTRAMUSCULAR | Status: AC
  Filled 2013-05-04: qty 2

## 2013-05-04 SURGICAL SUPPLY — 31 items
BAG DECANTER FOR FLEXI CONT (MISCELLANEOUS) ×2 IMPLANT
BAG HAMPER (MISCELLANEOUS) ×2 IMPLANT
CATH THERMACHOICE III (CATHETERS) ×2 IMPLANT
CLOTH BEACON ORANGE TIMEOUT ST (SAFETY) ×2 IMPLANT
COVER LIGHT HANDLE STERIS (MISCELLANEOUS) ×4 IMPLANT
COVER MAYO STAND XLG (DRAPE) ×2 IMPLANT
FORMALIN 10 PREFIL 120ML (MISCELLANEOUS) ×2 IMPLANT
GAUZE SPONGE 4X4 16PLY XRAY LF (GAUZE/BANDAGES/DRESSINGS) ×2 IMPLANT
GLOVE BIOGEL PI IND STRL 7.0 (GLOVE) ×2 IMPLANT
GLOVE BIOGEL PI IND STRL 8 (GLOVE) ×1 IMPLANT
GLOVE BIOGEL PI INDICATOR 7.0 (GLOVE) ×2
GLOVE BIOGEL PI INDICATOR 8 (GLOVE) ×1
GLOVE ECLIPSE 8.0 STRL XLNG CF (GLOVE) ×2 IMPLANT
GLOVE EXAM NITRILE MD LF STRL (GLOVE) ×4 IMPLANT
GLOVE OPTIFIT SS 6.5 STRL BRWN (GLOVE) ×2 IMPLANT
GOWN STRL REIN XL XLG (GOWN DISPOSABLE) ×6 IMPLANT
INST SET HYSTEROSCOPY (KITS) ×2 IMPLANT
IV D5W 500ML (IV SOLUTION) ×2 IMPLANT
IV NS IRRIG 3000ML ARTHROMATIC (IV SOLUTION) ×2 IMPLANT
KIT ROOM TURNOVER AP CYSTO (KITS) ×2 IMPLANT
MANIFOLD NEPTUNE II (INSTRUMENTS) ×2 IMPLANT
MARKER SKIN DUAL TIP RULER LAB (MISCELLANEOUS) ×2 IMPLANT
NS IRRIG 1000ML POUR BTL (IV SOLUTION) ×2 IMPLANT
PACK BASIC III (CUSTOM PROCEDURE TRAY) ×1
PACK SRG BSC III STRL LF ECLPS (CUSTOM PROCEDURE TRAY) ×1 IMPLANT
PAD ARMBOARD 7.5X6 YLW CONV (MISCELLANEOUS) ×2 IMPLANT
PAD TELFA 3X4 1S STER (GAUZE/BANDAGES/DRESSINGS) ×2 IMPLANT
SET BASIN LINEN APH (SET/KITS/TRAYS/PACK) ×2 IMPLANT
SET IRRIG Y TYPE TUR BLADDER L (SET/KITS/TRAYS/PACK) ×2 IMPLANT
SHEET LAVH (DRAPES) ×2 IMPLANT
YANKAUER SUCT BULB TIP 10FT TU (MISCELLANEOUS) ×2 IMPLANT

## 2013-05-04 NOTE — Transfer of Care (Signed)
Immediate Anesthesia Transfer of Care Note  Patient: Emma Morris  Procedure(s) Performed: Procedure(s) with comments: DILATATION & CURETTAGE/HYSTEROSCOPY WITH THERMACHOICE ABLATION (N/A) - temperature:87 degrees; total therapy time:14 minutes and 5 seconds  Patient Location: PACU  Anesthesia Type:General  Level of Consciousness: awake and alert   Airway & Oxygen Therapy: Patient Spontanous Breathing and Patient connected to face mask oxygen  Post-op Assessment: Report given to PACU RN  Post vital signs: Reviewed and stable  Complications: No apparent anesthesia complications

## 2013-05-04 NOTE — Anesthesia Preprocedure Evaluation (Signed)
Anesthesia Evaluation  Patient identified by MRN, date of birth, ID band Patient awake    Reviewed: Allergy & Precautions, H&P , NPO status , Patient's Chart, lab work & pertinent test results  Airway Mallampati: II TM Distance: >3 FB Neck ROM: Full    Dental  (+) Teeth Intact and Poor Dentition   Pulmonary neg pulmonary ROS,  breath sounds clear to auscultation        Cardiovascular hypertension, Pt. on medications Rhythm:Regular Rate:Normal     Neuro/Psych    GI/Hepatic negative GI ROS,   Endo/Other  Morbid obesity  Renal/GU      Musculoskeletal   Abdominal   Peds  Hematology  (+) anemia , Transfused PRBC's for severe blood loss.   Anesthesia Other Findings   Reproductive/Obstetrics                           Anesthesia Physical Anesthesia Plan  ASA: II  Anesthesia Plan: General   Post-op Pain Management:    Induction: Intravenous  Airway Management Planned: LMA  Additional Equipment:   Intra-op Plan:   Post-operative Plan: Extubation in OR  Informed Consent: I have reviewed the patients History and Physical, chart, labs and discussed the procedure including the risks, benefits and alternatives for the proposed anesthesia with the patient or authorized representative who has indicated his/her understanding and acceptance.     Plan Discussed with:   Anesthesia Plan Comments: (Possible GOT if required )        Anesthesia Quick Evaluation

## 2013-05-04 NOTE — Progress Notes (Addendum)
Pt to BR via w/c on way back from PACU.  Pt states she feels pressure like she needs to go have a BM.   DR Jayme Cloud notified of low sat on arrival and High B/P.  Dr Despina Hidden Notified of the pressure in rectum, urge to have a BM.  Instruct pt to use IS and take pain med and laxative as needed.

## 2013-05-04 NOTE — Anesthesia Postprocedure Evaluation (Signed)
  Anesthesia Post-op Note  Patient: Emma Morris  Procedure(s) Performed: Procedure(s) with comments: DILATATION & CURETTAGE/HYSTEROSCOPY WITH THERMACHOICE ABLATION (N/A) - temperature:87 degrees; total therapy time:14 minutes and 5 seconds  Patient Location: PACU  Anesthesia Type:General  Level of Consciousness: awake  Airway and Oxygen Therapy: Patient Spontanous Breathing  Post-op Pain: none  Post-op Assessment: Post-op Vital signs reviewed, Patient's Cardiovascular Status Stable, Respiratory Function Stable, Patent Airway and No signs of Nausea or vomiting  Post-op Vital Signs: Reviewed and stable  Complications: No apparent anesthesia complications

## 2013-05-04 NOTE — Anesthesia Procedure Notes (Addendum)
Procedure Name: LMA Insertion Date/Time: 05/04/2013 2:05 PM Performed by: Glynn Octave E Pre-anesthesia Checklist: Patient identified, Patient being monitored, Emergency Drugs available, Timeout performed and Suction available Patient Re-evaluated:Patient Re-evaluated prior to inductionOxygen Delivery Method: Circle System Utilized Preoxygenation: Pre-oxygenation with 100% oxygen Intubation Type: IV induction Ventilation: Mask ventilation without difficulty LMA: LMA inserted LMA Size: 4.0 Number of attempts: 1 Placement Confirmation: positive ETCO2 and breath sounds checked- equal and bilateral   Procedure Name: Intubation Date/Time: 05/04/2013 2:16 PM Performed by: Glynn Octave E Pre-anesthesia Checklist: Patient identified, Patient being monitored, Timeout performed, Emergency Drugs available and Suction available Patient Re-evaluated:Patient Re-evaluated prior to inductionOxygen Delivery Method: Circle System Utilized Preoxygenation: Pre-oxygenation with 100% oxygen Intubation Type: IV induction Ventilation: Mask ventilation without difficulty Laryngoscope Size: Mac and 3 Grade View: Grade I Tube type: Oral Tube size: 7.0 mm Number of attempts: 1 Airway Equipment and Method: stylet Placement Confirmation: ETT inserted through vocal cords under direct vision,  positive ETCO2 and breath sounds checked- equal and bilateral Secured at: 21 cm Tube secured with: Tape Dental Injury: Teeth and Oropharynx as per pre-operative assessment  Comments: # 4 LMA placed with air leak at 23cm H2O.  Able to ventilate, spontaneous respirations with very small tidal volumes.  Before placing on pressure support patient lightened up, cough ed and desaturated.  Dr.  Jayme Cloud in and patient intubated.

## 2013-05-04 NOTE — Op Note (Signed)
Preoperative diagnosis: Menometrorrhagia                                        Dysmenorrhea                                         Anemia  Postoperative diagnoses: Same as above   Procedure: Hysteroscopy, uterine curettage, endometrial ablation  Surgeon: Despina Hidden MD  Anesthesia: Laryngeal mask airway  Findings: The endometrium was normal. There were no fibroid or other abnormalities.  Description of operation: The patient was taken to the operating room and placed in the supine position. She underwent general anesthesia using the laryngeal mask airway. She was placed in the dorsal lithotomy position and prepped and draped in the usual sterile fashion. A Graves speculum was placed and the anterior cervical lip was grasped with a single-tooth tenaculum. The cervix was dilated serially to allow passage of the hysteroscope. Diagnostic hysteroscopy was performed and was found to be normal. A vigorous uterine curettage was then performed and all tissue sent to pathology for evaluation. The ThermaChoice 3 endometrial ablation balloon was then used were 43 cc of D5W was required to maintain a pressure of 190-200 mm of mercury throughout the procedure. Toatl therapy time was 14:05.  All of the equipment worked well throughout the procedure. All of the fluid was returned at the end of the procedure. The patient was awakened from anesthesia and taken to the recovery room in good stable condition all counts were correct. She received 2 g of Ancef and 30 mg of Toradol preoperatively. She will be discharged from the recovery room and followed up in the office in 1- 2 weeks.  Lamija Besse H 05/04/2013 3:01 PM

## 2013-05-04 NOTE — H&P (Signed)
Patient is a 52 y.o. female presenting with back pain. The history is provided by the patient.  Back Pain Associated symptoms: abdominal pain  Associated symptoms: no chest pain, no dysuria, no fever and no headaches  patient presents with a four-week history of left-sided abdominal pain is suprapubic abdominal pain. No nausea no vomiting associated with shortness of breath. Patient states the pain is 8/10 it has been waxing and waning over the past month. Associated with vaginal bleeding for the past 5 weeks. Patient has been seen at [facility] to clinic for this. Patient denies being started on any hormone manipulation treatment for the vaginal bleeding.  Past Medical History   Diagnosis  Date   .  Hypertension    History reviewed. No pertinent past surgical history.  No family history on file.  History   Substance Use Topics   .  Smoking status:  Never Smoker   .  Smokeless tobacco:  Not on file   .  Alcohol Use:  No    OB History    Grav  Para  Term  Preterm  Abortions  TAB  SAB  Ect  Mult  Living                 Review of Systems  Constitutional: Negative for fever.  HENT: Negative for congestion.  Eyes: Negative for redness.  Respiratory: Positive for shortness of breath.  Cardiovascular: Positive for leg swelling. Negative for chest pain.  Gastrointestinal: Positive for abdominal pain.  Genitourinary: Positive for vaginal bleeding. Negative for dysuria.  Musculoskeletal: Positive for back pain.  Skin: Negative for rash.  Neurological: Negative for headaches.  Hematological: Does not bruise/bleed easily.  Psychiatric/Behavioral: Negative for confusion.  Allergies   Review of patient's allergies indicates no known allergies.  Home Medications    Current Outpatient Rx   Name   Route   Sig   Dispense   Refill   .  cloNIDine (CATAPRES) 0.2 MG tablet   Oral   Take 0.2 mg by mouth daily.       Marland Kitchen  ibuprofen (ADVIL,MOTRIN) 800 MG tablet   Oral   Take 1 tablet (800 mg total) by mouth  3 (three) times daily.   21 tablet   0   BP 138/75  Pulse 99  Temp(Src) 98.3 F (36.8 C) (Oral)  Resp 16  Ht 5\' 2"  (1.575 m)  Wt 251 lb (113.853 kg)  BMI 45.90 kg/m2  SpO2 100%  LMP 04/24/2013  Physical Exam  Nursing note and vitals reviewed.  Constitutional: She is oriented to person, place, and time. She appears well-developed and well-nourished.  HENT:  Head: Normocephalic and atraumatic.  Mouth/Throat: Oropharynx is clear and moist.  Eyes: Conjunctivae and EOM are normal. Pupils are equal, round, and reactive to light.  Cardiovascular: Normal rate, regular rhythm and normal heart sounds.  No murmur heard.  Pulmonary/Chest: Effort normal and breath sounds normal. No respiratory distress.  Abdominal: Bowel sounds are normal. There is no tenderness.  Genitourinary:  Vaginal bleeding.  Musculoskeletal: Normal range of motion. She exhibits edema.  Neurological: She is alert and oriented to person, place, and time. No cranial nerve deficit. She exhibits normal muscle tone. Coordination normal.  Skin: No rash noted.  ED Course   Procedures (including critical care time)  Labs Review  Labs Reviewed   COMPREHENSIVE METABOLIC PANEL - Abnormal; Notable for the following:    Sodium  134 (*)     Potassium  3.2 (*)  Calcium  8.2 (*)     Albumin  3.0 (*)     Total Bilirubin  0.2 (*)     GFR calc non Af Amer  83 (*)     All other components within normal limits   CBC WITH DIFFERENTIAL - Abnormal; Notable for the following:    RBC  1.99 (*)     Hemoglobin  4.7 (*)     HCT  15.7 (*)     MCH  23.6 (*)     MCHC  29.9 (*)     RDW  16.6 (*)     All other components within normal limits   LIPASE, BLOOD   URINALYSIS, ROUTINE W REFLEX MICROSCOPIC   PRO B NATRIURETIC PEPTIDE   TROPONIN I   TYPE AND SCREEN   PREPARE RBC (CROSSMATCH)   ABO/RH    Results for orders placed during the hospital encounter of 05/02/13   COMPREHENSIVE METABOLIC PANEL   Result  Value  Range    Sodium   134 (*)  135 - 145 mEq/L    Potassium  3.2 (*)  3.5 - 5.1 mEq/L    Chloride  99  96 - 112 mEq/L    CO2  27  19 - 32 mEq/L    Glucose, Bld  97  70 - 99 mg/dL    BUN  9  6 - 23 mg/dL    Creatinine, Ser  9.14  0.50 - 1.10 mg/dL    Calcium  8.2 (*)  8.4 - 10.5 mg/dL    Total Protein  6.8  6.0 - 8.3 g/dL    Albumin  3.0 (*)  3.5 - 5.2 g/dL    AST  8  0 - 37 U/L    ALT  9  0 - 35 U/L    Alkaline Phosphatase  39  39 - 117 U/L    Total Bilirubin  0.2 (*)  0.3 - 1.2 mg/dL    GFR calc non Af Amer  83 (*)  >90 mL/min    GFR calc Af Amer  >90  >90 mL/min   LIPASE, BLOOD   Result  Value  Range    Lipase  22  11 - 59 U/L   CBC WITH DIFFERENTIAL   Result  Value  Range    WBC  7.4  4.0 - 10.5 K/uL    RBC  1.99 (*)  3.87 - 5.11 MIL/uL    Hemoglobin  4.7 (*)  12.0 - 15.0 g/dL    HCT  78.2 (*)  95.6 - 46.0 %    MCV  78.9  78.0 - 100.0 fL    MCH  23.6 (*)  26.0 - 34.0 pg    MCHC  29.9 (*)  30.0 - 36.0 g/dL    RDW  21.3 (*)  08.6 - 15.5 %    Platelets  291  150 - 400 K/uL    Neutrophils Relative %  65  43 - 77 %    Neutro Abs  4.9  1.7 - 7.7 K/uL    Lymphocytes Relative  23  12 - 46 %    Lymphs Abs  1.7  0.7 - 4.0 K/uL    Monocytes Relative  8  3 - 12 %    Monocytes Absolute  0.6  0.1 - 1.0 K/uL    Eosinophils Relative  4  0 - 5 %    Eosinophils Absolute  0.3  0.0 - 0.7 K/uL  Basophils Relative  0  0 - 1 %    Basophils Absolute  0.0  0.0 - 0.1 K/uL   URINALYSIS, ROUTINE W REFLEX MICROSCOPIC   Result  Value  Range    Color, Urine  YELLOW  YELLOW    APPearance  CLEAR  CLEAR    Specific Gravity, Urine  1.025  1.005 - 1.030    pH  6.0  5.0 - 8.0    Glucose, UA  NEGATIVE  NEGATIVE mg/dL    Hgb urine dipstick  NEGATIVE  NEGATIVE    Bilirubin Urine  NEGATIVE  NEGATIVE    Ketones, ur  NEGATIVE  NEGATIVE mg/dL    Protein, ur  NEGATIVE  NEGATIVE mg/dL    Urobilinogen, UA  0.2  0.0 - 1.0 mg/dL    Nitrite  NEGATIVE  NEGATIVE    Leukocytes, UA  NEGATIVE  NEGATIVE   PRO B NATRIURETIC PEPTIDE    Result  Value  Range    Pro B Natriuretic peptide (BNP)  90.2  0 - 125 pg/mL   TROPONIN I   Result  Value  Range    Troponin I  <0.30  <0.30 ng/mL   TYPE AND SCREEN   Result  Value  Range    ABO/RH(D)  O POS     Antibody Screen  NEG     Sample Expiration  05/05/2013     Unit Number  [id number]     Blood Component Type  RBC LR PHER2     Unit division  00     Status of Unit  ALLOCATED     Transfusion Status  OK TO TRANSFUSE     Crossmatch Result  Compatible     Unit Number  [id number]     Blood Component Type  RED CELLS,LR     Unit division  00     Status of Unit  ALLOCATED     Transfusion Status  OK TO TRANSFUSE     Crossmatch Result  Compatible    PREPARE RBC (CROSSMATCH)   Result  Value  Range    Order Confirmation  ORDER PROCESSED BY BLOOD BANK    ABO/RH   Result  Value  Range    ABO/RH(D)  O POS    Imaging Review  Dg Chest 2 View  05/02/2013 CLINICAL DATA: Back pain and leg swelling. EXAM: CHEST 2 VIEW COMPARISON: None available for comparison at time of study interpretation. FINDINGS: The cardiac silhouette appears mildly enlarged, mediastinal silhouette is unremarkable. Low inspiratory examination with crowded vasculature markings, no superimposed pleural effusions or focal consolidations. No pneumothorax. Large body habitus. Osseous structures are nonsuspicious. IMPRESSION: Mild cardiomegaly, no acute pulmonary process for this low inspiratory examination. Electronically Signed By: Awilda Metro On: 05/02/2013 06:36  Ct Abdomen Pelvis W Contrast  05/02/2013 CLINICAL DATA: Left abdominal pain with legs EXAM: CT ABDOMEN AND PELVIS WITH CONTRAST TECHNIQUE: Multidetector CT imaging of the abdomen and pelvis was performed using the standard protocol following bolus administration of intravenous contrast. CONTRAST: 50mL OMNIPAQUE IOHEXOL 300 MG/ML SOLN, OMNIPAQUE IOHEXOL 300 MG/ML SOLN COMPARISON: None. FINDINGS: BODY WALL: Unremarkable. LOWER CHEST: Probable mild air  trapping at the bases were there is mosaic attenuation. ABDOMEN/PELVIS: Liver: No focal abnormality. Biliary: Gallstones without evidence of acute cholecystitis. Pancreas: Unremarkable. Spleen: Unremarkable. Adrenals: Unremarkable. Kidneys and ureters: No hydronephrosis or stone. Bladder: Unremarkable. Reproductive: The right ovary is larger than the left, measuring 3 x 3.8 by 3.8 cm. No discrete cyst or mass seen. Bowel:  The colon is moderately distended at multiple locations with gas and stool. The sigmoid colon is redundant and partially twisted, without current volvulus or obstruction. No pericecal inflammation. Retroperitoneum: No mass or adenopathy. Peritoneum: No free fluid or gas. Vascular: No acute abnormality. OSSEOUS: No acute abnormalities. IMPRESSION: 1. No acute intra-abdominal findings. 2. Gas and stool-filled colon which could be a source of left lower quadrant pain. Redundant, tortuous sigmoid colon which places the patient at risk for volvulus. 3. Cholelithiasis without acute cholecystitis. 4. Prominent right ovarian size for age, 3 x 4 x 4 cm. If the patient is postmenopausal, recommend outpatient followup sonography. Electronically Signed By: Tiburcio Pea M.D. On: 05/02/2013 06:50  EKG Interpretation    Date/Time: Wednesday May 02 2013 03:04:48 EST  Ventricular Rate: 90  PR Interval: 148  QRS Duration: 72  QT Interval: 366  QTC Calculation: 447  R Axis: 5  Text Interpretation: Normal sinus rhythm Normal ECG No previous ECGs available Confirmed by ZACKOWSKI MD, SCOTT ([id number]) on 05/02/2013 3:37:50 AM     CRITICAL CARE Performed by: Shelda Jakes.  Total critical care time: 30  Critical care time was exclusive of separately billable procedures and treating other patients.  Critical care was necessary to treat or prevent imminent or life-threatening deterioration.  Critical care was time spent personally by me on the following activities: development of treatment plan with  patient and/or surrogate as well as nursing, discussions with consultants, evaluation of patient's response to treatment, examination of patient, obtaining history from patient or surrogate, ordering and performing treatments and interventions, ordering and review of laboratory studies, ordering and review of radiographic studies, pulse oximetry and re-evaluation of patient's condition.  MDM    1.  Abdominal pain   2.  Anemia   3.  Vaginal bleeding   4.  Cholelithiasis   Patient with marked anemia. Most likely due to the vaginal bleeding for the past 5 weeks. CT scan done to evaluate intra-abdominal processes and 4 the left abdominal pain.  Patient will require admission for the significant anemia also requires blood transfusion. Transfusion orders begun. Patient may need hormonal manipulation an OB/GYN consultation while in the hospital. If the CT scan is negative would recommend starting Megace at 40 mg. Once a day.  EURE,LUTHER H 05/04/2013 10:36 AM

## 2013-05-05 NOTE — Anesthesia Postprocedure Evaluation (Signed)
  Anesthesia Post-op Note  Patient: Emma Morris  Procedure(s) Performed: Procedure(s) with comments: DILATATION & CURETTAGE/HYSTEROSCOPY WITH THERMACHOICE ABLATION (N/A) - temperature:87 degrees; total therapy time:14 minutes and 5 seconds  Patient Location: 322  Anesthesia Type:General  Level of Consciousness: awake, alert , oriented and patient cooperative  Airway and Oxygen Therapy: Patient Spontanous Breathing  Post-op Pain: 2 /10, mild  Post-op Assessment: Post-op Vital signs reviewed, Patient's Cardiovascular Status Stable, Respiratory Function Stable, Patent Airway and Pain level controlled  Post-op Vital Signs: Reviewed and stable  Complications: No apparent anesthesia complications

## 2013-05-06 LAB — TYPE AND SCREEN
ABO/RH(D): O POS
Antibody Screen: NEGATIVE
Unit division: 0
Unit division: 0
Unit division: 0
Unit division: 0
Unit division: 0
Unit division: 0

## 2013-05-08 ENCOUNTER — Encounter (HOSPITAL_COMMUNITY): Payer: Self-pay | Admitting: Obstetrics & Gynecology

## 2013-05-08 LAB — POCT I-STAT 4, (NA,K, GLUC, HGB,HCT)
Glucose, Bld: 79 mg/dL (ref 70–99)
HCT: 36 % (ref 36.0–46.0)
Sodium: 139 mEq/L (ref 135–145)

## 2013-05-21 ENCOUNTER — Ambulatory Visit (INDEPENDENT_AMBULATORY_CARE_PROVIDER_SITE_OTHER): Payer: BC Managed Care – PPO | Admitting: Obstetrics & Gynecology

## 2013-05-21 ENCOUNTER — Encounter: Payer: Self-pay | Admitting: Obstetrics & Gynecology

## 2013-05-21 VITALS — BP 132/90 | Ht 62.0 in | Wt 238.0 lb

## 2013-05-21 DIAGNOSIS — Z9889 Other specified postprocedural states: Secondary | ICD-10-CM

## 2013-05-21 NOTE — Discharge Summary (Signed)
Physician Discharge Summary  Patient ID: Emma Morris MRN: [id number] DOB/AGE: June 12, 1960 53 y.o.  Admit date: 05/02/2013 Discharge date: 05/21/2013  Admission Diagnoses: Menometrorrhagia Severe anemia  Discharge Diagnoses:  Active Problems:   Vaginal bleeding   Anemia due to chronic blood loss   Menometrorrhagia   Discharged Condition: good  Hospital Course: 5 units transfused, hysteroscopy uterine curettage endometrial ablation  Consults: None  Significant Diagnostic Studies: labs: cbc  Treatments: surgery: as above and transfusion  Discharge Exam: Blood pressure 169/99, pulse 88, temperature 98 F (36.7 C), temperature source Oral, resp. rate 20, height 5\' 2"  (1.575 m), weight 252 lb 12.8 oz (114.669 kg), last menstrual period 04/24/2013, SpO2 93.00%. General appearance: alert, cooperative and no distress  Disposition: 01-Home or Self Care  Discharge Orders   Future Orders Complete By Expires   Call MD for:  persistant nausea and vomiting  As directed    Call MD for:  severe uncontrolled pain  As directed    Call MD for:  temperature >100.4  As directed    Diet - low sodium heart healthy  As directed    Discharge instructions  As directed    Comments:     No sex for 3 weeks   Increase activity slowly  As directed        Medication List         ketorolac 10 MG tablet  Commonly known as:  TORADOL  Take 1 tablet (10 mg total) by mouth every 8 (eight) hours as needed.     ondansetron 8 MG tablet  Commonly known as:  ZOFRAN  Take 1 tablet (8 mg total) by mouth every 8 (eight) hours as needed for nausea.     oxyCODONE-acetaminophen 5-325 MG per tablet  Commonly known as:  ROXICET  Take 1-2 tablets by mouth every 6 (six) hours as needed for severe pain.     predniSONE 10 MG tablet  Commonly known as:  DELTASONE  Take 10 mg by mouth daily with breakfast. Take 6 tablets daily, then decrease by 1 each day...patient didn't understand directions on bottle,  so has only been taking one a day after the first day of taking 6.           Follow-up Information   Follow up with Lazaro ArmsEURE,LUTHER H, MD On 05/21/2013.   Specialties:  Obstetrics and Gynecology, Radiology   Contact information:   [address] [place] [address] [phone]       Signed: Lazaro ArmsURE,LUTHER H 05/21/2013, 8:44 PM

## 2013-05-21 NOTE — Progress Notes (Signed)
Patient ID: Emma Morris, female   DOB: Jul 07, 1960, 53 y.o.   MRN: [id number] Pt is post op from hysteroscopy uterine curettage endometrial ablation for heavy vaginal bleeding, transfused 5 units prbc while in hospital Doing well Back at work No complaints except watery discharge  Exam Normal discharge No abnormalities Recommend follow up in 1 year

## 2013-05-24 ENCOUNTER — Telehealth: Payer: Self-pay | Admitting: Obstetrics and Gynecology

## 2013-05-24 NOTE — Telephone Encounter (Signed)
Pt to bring forms to be filled out.

## 2013-05-24 NOTE — Telephone Encounter (Signed)
Pt states needs note faxed to work from were she had surgery in December. Pt states unsure what needs to be on note but gave work number [phone] and told to speak with Lupita LeashDonna concerning note. Attempted to contact Lupita LeashDonna, lmtrc x 1.

## 2013-05-25 DIAGNOSIS — Z029 Encounter for administrative examinations, unspecified: Secondary | ICD-10-CM

## 2013-05-30 ENCOUNTER — Other Ambulatory Visit: Payer: Self-pay | Admitting: *Deleted

## 2013-05-30 MED ORDER — KETOROLAC TROMETHAMINE 10 MG PO TABS: 10.0000 mg | ORAL_TABLET | Freq: Three times a day (TID) | ORAL | Status: DC | PRN

## 2014-01-15 ENCOUNTER — Emergency Department (HOSPITAL_COMMUNITY)
Admission: EM | Admit: 2014-01-15 | Discharge: 2014-01-15 | Disposition: A | Discharge status: Home / Self Care | Payer: BC Managed Care – PPO | Attending: Emergency Medicine | Admitting: Emergency Medicine

## 2014-01-15 ENCOUNTER — Encounter (HOSPITAL_COMMUNITY): Payer: Self-pay | Admitting: Emergency Medicine

## 2014-01-15 DIAGNOSIS — IMO0002 Reserved for concepts with insufficient information to code with codable children: Secondary | ICD-10-CM | POA: Insufficient documentation

## 2014-01-15 DIAGNOSIS — Z79899 Other long term (current) drug therapy: Secondary | ICD-10-CM | POA: Diagnosis not present

## 2014-01-15 DIAGNOSIS — R040 Epistaxis: Secondary | ICD-10-CM | POA: Diagnosis present

## 2014-01-15 DIAGNOSIS — I10 Essential (primary) hypertension: Secondary | ICD-10-CM | POA: Insufficient documentation

## 2014-01-15 MED ORDER — PHENYLEPHRINE HCL 1 % NA SOLN
NASAL | Status: AC
  Administered 2014-01-15: 03:00:00
  Filled 2014-01-15: qty 15

## 2014-01-15 NOTE — ED Notes (Signed)
Nosebleed off and on all day, states bleeding from left nare.  Pt also c/o cough and sore throat.

## 2014-01-15 NOTE — Discharge Instructions (Signed)
Neo-Synephrine: 2 sprays every 8 hours for the next 2 days.  Hold direct pressure by pinching your nose shut for 15 minutes if bleeding recurs. Return to the ER if your bleeding substantially worsens.   Nosebleed Nosebleeds can be caused by many conditions, including trauma, infections, polyps, foreign bodies, dry mucous membranes or climate, medicines, and air conditioning. Most nosebleeds occur in the front of the nose. Because of this location, most nosebleeds can be controlled by pinching the nostrils gently and continuously for at least 10 to 20 minutes. The long, continuous pressure allows enough time for the blood to clot. If pressure is released during that 10 to 20 minute time period, the process may have to be started again. The nosebleed may stop by itself or quit with pressure, or it may need concentrated heating (cautery) or pressure from packing. HOME CARE INSTRUCTIONS   If your nose was packed, try to maintain the pack inside until your health care provider removes it. If a gauze pack was used and it starts to fall out, gently replace it or cut the end off. Do not cut if a balloon catheter was used to pack the nose. Otherwise, do not remove unless instructed.  Avoid blowing your nose for 12 hours after treatment. This could dislodge the pack or clot and start the bleeding again.  If the bleeding starts again, sit up and bend forward, gently pinching the front half of your nose continuously for 20 minutes.  If bleeding was caused by dry mucous membranes, use over-the-counter saline nasal spray or gel. This will keep the mucous membranes moist and allow them to heal. If you must use a lubricant, choose the water-soluble variety. Use it only sparingly and not within several hours of lying down.  Do not use petroleum jelly or mineral oil, as these may drip into the lungs and cause serious problems.  Maintain humidity in your home by using less air conditioning or by using a  humidifier.  Do not use aspirin or medicines which make bleeding more likely. Your health care provider can give you recommendations on this.  Resume normal activities as you are able, but try to avoid straining, lifting, or bending at the waist for several days.  If the nosebleeds become recurrent and the cause is unknown, your health care provider may suggest laboratory tests. SEEK MEDICAL CARE IF: You have a fever. SEEK IMMEDIATE MEDICAL CARE IF:   Bleeding recurs and cannot be controlled.  There is unusual bleeding from or bruising on other parts of the body.  Nosebleeds continue.  There is any worsening of the condition which originally brought you in.  You become light-headed, feel faint, become sweaty, or vomit blood. MAKE SURE YOU:   Understand these instructions.  Will watch your condition.  Will get help right away if you are not doing well or get worse. Document Released: 02/10/2005 Document Revised: 09/17/2013 Document Reviewed: 04/03/2009 [facility] Patient Information 2015 [place], [place]. This information is not intended to replace advice given to you by your health care provider. Make sure you discuss any questions you have with your health care provider.

## 2014-01-15 NOTE — ED Provider Notes (Signed)
CSN: [id number]     Arrival date & time 01/15/14  0206 History   First MD Initiated Contact with Patient 01/15/14 0232     Chief Complaint  Patient presents with  . Epistaxis     (Consider location/radiation/quality/duration/timing/severity/associated sxs/prior Treatment) HPI Comments: Patient is a 53 year old female with history of hypertension. She woke from sleep with a nosebleed. She denies any injury or trauma. She denies any recent illness.  Patient is a 53 y.o. female presenting with nosebleeds. The history is provided by the patient.  Epistaxis Location:  L nare Severity:  Moderate Duration:  30 minutes Timing:  Constant Progression:  Resolved Chronicity:  New Context: not anticoagulants and not trauma   Relieved by:  Nothing Worsened by:  Nothing tried Ineffective treatments:  None tried   Past Medical History  Diagnosis Date  . Hypertension    Past Surgical History  Procedure Laterality Date  . Dilitation & currettage/hystroscopy with thermachoice ablation N/A 05/04/2013    Procedure: DILATATION & CURETTAGE/HYSTEROSCOPY WITH THERMACHOICE ABLATION;  Surgeon: Lazaro Arms, MD;  Location: [facility];  Service: Gynecology;  Laterality: N/A;  temperature:87 degrees; total therapy time:14 minutes and 5 seconds   Family History  Problem Relation Age of Onset  . Cancer Mother   . Kidney disease Father   . Diabetes Father    History  Substance Use Topics  . Smoking status: Never Smoker   . Smokeless tobacco: Not on file  . Alcohol Use: No   OB History   Grav Para Term Preterm Abortions TAB SAB Ect Mult Living                 Review of Systems  HENT: Positive for nosebleeds.   All other systems reviewed and are negative.     Allergies  Review of patient's allergies indicates no known allergies.  Home Medications   Prior to Admission medications   Medication Sig Start Date End Date Taking? Authorizing Provider  hydrochlorothiazide (HYDRODIURIL) 25 MG  tablet Take 25 mg by mouth daily.   Yes Historical Provider, MD  lisinopril (PRINIVIL,ZESTRIL) 10 MG tablet Take 10 mg by mouth daily.   Yes Historical Provider, MD  ketorolac (TORADOL) 10 MG tablet Take 1 tablet (10 mg total) by mouth every 8 (eight) hours as needed. 05/30/13   Lazaro Arms, MD  ondansetron (ZOFRAN) 8 MG tablet Take 1 tablet (8 mg total) by mouth every 8 (eight) hours as needed for nausea. 05/04/13   Lazaro Arms, MD  oxyCODONE-acetaminophen (ROXICET) 5-325 MG per tablet Take 1-2 tablets by mouth every 6 (six) hours as needed for severe pain. 05/04/13   Lazaro Arms, MD  predniSONE (DELTASONE) 10 MG tablet Take 10 mg by mouth daily with breakfast. Take 6 tablets daily, then decrease by 1 each day...patient didn't understand directions on bottle, so has only been taking one a day after the first day of taking 6.    Historical Provider, MD   BP 190/98  Pulse 108  Temp(Src) 98.2 F (36.8 C) (Oral)  Resp 16  Wt 238 lb (107.956 kg)  SpO2 99% Physical Exam  Nursing note and vitals reviewed. Constitutional: She is oriented to person, place, and time. She appears well-developed and well-nourished. No distress.  HENT:  Head: Normocephalic and atraumatic.  Mouth/Throat: Oropharynx is clear and moist.  The left nares has dried blood but no active bleeding. The right nares is clear.  Neck: Normal range of motion. Neck supple.  Musculoskeletal: Normal range  of motion.  Lymphadenopathy:    She has no cervical adenopathy.  Neurological: She is alert and oriented to person, place, and time.  Skin: Skin is warm and dry. She is not diaphoretic.    ED Course  Procedures (including critical care time) Labs Review Labs Reviewed - No data to display  Imaging Review No results found.   EKG Interpretation None      MDM   Final diagnoses:  None    Patient presents with nosebleed that has stopped prior to arrival. She is given intranasal Neo-Synephrine and has had no further  bleeding. She appears appropriate for discharge. She is to use the Neo-Synephrine for the next 2 days and return to the ER if her symptoms substantially worsen or change.    Geoffery Lyons, MD 01/15/14 [phone]

## 2014-05-05 ENCOUNTER — Emergency Department (HOSPITAL_COMMUNITY)
Admission: EM | Admit: 2014-05-05 | Discharge: 2014-05-05 | Disposition: A | Discharge status: Home / Self Care | Payer: BC Managed Care – PPO | Attending: Emergency Medicine | Admitting: Emergency Medicine

## 2014-05-05 ENCOUNTER — Encounter (HOSPITAL_COMMUNITY): Payer: Self-pay | Admitting: Emergency Medicine

## 2014-05-05 DIAGNOSIS — Z79899 Other long term (current) drug therapy: Secondary | ICD-10-CM | POA: Insufficient documentation

## 2014-05-05 DIAGNOSIS — Z7952 Long term (current) use of systemic steroids: Secondary | ICD-10-CM | POA: Diagnosis not present

## 2014-05-05 DIAGNOSIS — R062 Wheezing: Secondary | ICD-10-CM | POA: Insufficient documentation

## 2014-05-05 DIAGNOSIS — Z7951 Long term (current) use of inhaled steroids: Secondary | ICD-10-CM | POA: Diagnosis not present

## 2014-05-05 DIAGNOSIS — R21 Rash and other nonspecific skin eruption: Secondary | ICD-10-CM | POA: Diagnosis present

## 2014-05-05 DIAGNOSIS — I10 Essential (primary) hypertension: Secondary | ICD-10-CM | POA: Diagnosis not present

## 2014-05-05 MED ORDER — HYDROXYZINE HCL 25 MG PO TABS: 25.0000 mg | ORAL_TABLET | Freq: Four times a day (QID) | ORAL | Status: DC

## 2014-05-05 MED ORDER — LORATADINE 10 MG PO TABS
10.0000 mg | ORAL_TABLET | Freq: Once | ORAL | Status: AC
  Administered 2014-05-05: 10 mg via ORAL
  Filled 2014-05-05: qty 1

## 2014-05-05 MED ORDER — FAMOTIDINE 20 MG PO TABS
20.0000 mg | ORAL_TABLET | Freq: Once | ORAL | Status: AC
  Administered 2014-05-05: 20 mg via ORAL
  Filled 2014-05-05: qty 1

## 2014-05-05 MED ORDER — PREDNISONE (PAK) 10 MG PO TABS: ORAL_TABLET | Freq: Every day | ORAL | Status: DC

## 2014-05-05 MED ORDER — PERMETHRIN 5 % EX CREA: TOPICAL_CREAM | CUTANEOUS | Status: DC

## 2014-05-05 MED ORDER — IPRATROPIUM-ALBUTEROL 0.5-2.5 (3) MG/3ML IN SOLN
3.0000 mL | Freq: Once | RESPIRATORY_TRACT | Status: AC
  Administered 2014-05-05: 3 mL via RESPIRATORY_TRACT
  Filled 2014-05-05: qty 3

## 2014-05-05 MED ORDER — FAMOTIDINE 20 MG PO TABS: 20.0000 mg | ORAL_TABLET | Freq: Two times a day (BID) | ORAL | Status: DC

## 2014-05-05 MED ORDER — PREDNISONE 20 MG PO TABS
40.0000 mg | ORAL_TABLET | Freq: Once | ORAL | Status: AC
  Administered 2014-05-05: 40 mg via ORAL
  Filled 2014-05-05: qty 2

## 2014-05-05 MED ORDER — HYDROXYZINE HCL 25 MG PO TABS
25.0000 mg | ORAL_TABLET | Freq: Once | ORAL | Status: AC
  Administered 2014-05-05: 25 mg via ORAL
  Filled 2014-05-05: qty 1

## 2014-05-05 NOTE — ED Notes (Signed)
Patient c/o rash to arms and legs x1 week with itching. Per patient taking benadryl with no improvement.

## 2014-05-05 NOTE — ED Notes (Signed)
Patient's blood pressure 208/98-patient denies taking blood pressure medication today.

## 2014-05-05 NOTE — ED Provider Notes (Signed)
CSN: [id number]     Arrival date & time 05/05/14  1741 History  This chart was scribed for non-physician practitioner working with No att. providers found by Elveria Risingimelie Horne, ED Scribe. This patient was seen in room APOTF/OTF and the patient's care was started at 6:25 PM.   Chief Complaint  Patient presents with  . Rash    The history is provided by the patient. No language interpreter was used.   HPI Comments: Emma Morris is a 53 y.o. female with Hypertension who presents to the Emergency Department complaining of worsening, erythematous, pruritic, rash present for greater than one week now. Patient reports rash located to her arms bilaterally and her left leg and resolving rash to her abdomen. Patient reports that the itching is so severe she has difficulty sleeping. Patient reports change in her laundry detergent one month ago: she switched from Gain to Tide powder detergent.  Patient reports treatment with Benadryl but denies relief. Patient denies contacts with similar rash at home; patient lives with her children. Patient does not have any pets in the home. Patient reports wheezing, subjective fever, and chills since presentation of her rash. Patient denies history of similar rash or recation. Patient denies history of asthma but reports that sometimes she can "hear herself wheezing."  She complains that the itching wakes her sometimes at night.   Past Medical History  Diagnosis Date  . Hypertension    Past Surgical History  Procedure Laterality Date  . Dilitation & currettage/hystroscopy with thermachoice ablation N/A 05/04/2013    Procedure: DILATATION & CURETTAGE/HYSTEROSCOPY WITH THERMACHOICE ABLATION;  Surgeon: Lazaro ArmsLuther H Eure, MD;  Location: [facility];  Service: Gynecology;  Laterality: N/A;  temperature:87 degrees; total therapy time:14 minutes and 5 seconds   Family History  Problem Relation Age of Onset  . Cancer Mother   . Kidney disease Father   . Diabetes Father     History  Substance Use Topics  . Smoking status: Never Smoker   . Smokeless tobacco: Never Used  . Alcohol Use: No   OB History    Gravida Para Term Preterm AB TAB SAB Ectopic Multiple Living   4 4 3 1      4      Review of Systems  Constitutional: Negative for fever and chills.  HENT: Negative for trouble swallowing.   Respiratory: Negative for shortness of breath. Wheezing: occasional.   Gastrointestinal: Negative for nausea, vomiting and diarrhea.  Skin: Positive for rash.  All other systems negative  Allergies  Review of patient's allergies indicates no known allergies.  Home Medications   Prior to Admission medications   Medication Sig Start Date End Date Taking? Authorizing Provider  hydrochlorothiazide (HYDRODIURIL) 25 MG tablet Take 25 mg by mouth daily.   Yes Historical Provider, MD  lisinopril (PRINIVIL,ZESTRIL) 10 MG tablet Take 10 mg by mouth daily.   Yes Historical Provider, MD  mometasone (NASONEX) 50 MCG/ACT nasal spray Place 2 sprays into the nose daily as needed (Allergies).   Yes Historical Provider, MD  famotidine (PEPCID) 20 MG tablet Take 1 tablet (20 mg total) by mouth 2 (two) times daily. 05/05/14   Kioni Stahl Orlene OchM Tranell Wojtkiewicz, NP  hydrOXYzine (ATARAX/VISTARIL) 25 MG tablet Take 1 tablet (25 mg total) by mouth every 6 (six) hours. 05/05/14   Paxton Binns Orlene OchM Caitriona Sundquist, NP  permethrin (ELIMITE) 5 % cream Apply to affected area once 05/05/14   [facility] Zareya Tuckett, NP  predniSONE (STERAPRED UNI-PAK) 10 MG tablet Take by mouth daily. Starting tomorrow  take 5 tablets PO then 4, 3, 2, 1 05/05/14   Zohra Clavel Orlene OchM Meyli Boice, NP   Triage Vitals: BP 205/98 mmHg  Pulse 83  Temp(Src) 97.9 F (36.6 C) (Oral)  Resp 20  Ht 5\' 2"  (1.575 m)  Wt 247 lb (112.038 kg)  BMI 45.17 kg/m2  SpO2 100% Physical Exam  Constitutional: She is oriented to person, place, and time. She appears well-developed and well-nourished.  HENT:  Head: Normocephalic and atraumatic.  Nose: Nose normal.  Mouth/Throat: Oropharynx is  clear and moist and mucous membranes are normal. No oropharyngeal exudate.  Eyes: EOM are normal.  Neck: Normal range of motion. Neck supple.  Cardiovascular: Normal rate, regular rhythm and normal heart sounds.   Pulmonary/Chest: Effort normal. She has wheezes (occasional).  Abdominal: Soft. There is no tenderness.  Musculoskeletal: Normal range of motion.  See skin exam  Neurological: She is alert and oriented to person, place, and time. No cranial nerve deficit.  Skin: Skin is warm and dry. Rash noted. There is erythema.  Red raised, erythematous papular lesion on arms bilaterally. Few scattered patches to abdomen and left thigh. Rash with the appearance of insect bites with local reaction.   Psychiatric: She has a normal mood and affect. Her behavior is normal.  Nursing note and vitals reviewed.  Assessment: pruritic rash that may be scabies vs bed bug bites vs allergic contact dermitis. Will treat symptoms.    ED Course  Procedures (including critical care time) Dr. Elesa MassedWard in to see the patient and agrees with assessment and plan.  COORDINATION OF CARE: 6:30 PM- Plans to administer albuterol treatment for her wheezing and medication to relieve her itching. Discussed treatment plan with patient at bedside and patient agreed to plan.    MDM  53 y.o. female with rash and itching x 1 week. Stable for discharge without respiratory distress, no difficulty swallowing. Symptoms have improved with prednisone 40 mg. Po, Pepcid 20 mg Duoneb and Atarax 25 mg. Discussed with the patient clinical findings and plan of care and all questioned fully answered. She will return  if any problems arise.    Medication List    STOP taking these medications        diphenhydrAMINE 25 MG tablet  Commonly known as:  BENADRYL     ketorolac 10 MG tablet  Commonly known as:  TORADOL     ondansetron 8 MG tablet  Commonly known as:  ZOFRAN     oxyCODONE-acetaminophen 5-325 MG per tablet  Commonly known as:   ROXICET      TAKE these medications        famotidine 20 MG tablet  Commonly known as:  PEPCID  Take 1 tablet (20 mg total) by mouth 2 (two) times daily.     hydrOXYzine 25 MG tablet  Commonly known as:  ATARAX/VISTARIL  Take 1 tablet (25 mg total) by mouth every 6 (six) hours.     permethrin 5 % cream  Commonly known as:  ELIMITE  Apply to affected area once     predniSONE 10 MG tablet  Commonly known as:  STERAPRED UNI-PAK  Take by mouth daily. Starting tomorrow take 5 tablets PO then 4, 3, 2, 1      ASK your doctor about these medications        hydrochlorothiazide 25 MG tablet  Commonly known as:  HYDRODIURIL  Take 25 mg by mouth daily.     lisinopril 10 MG tablet  Commonly known as:  PRINIVIL,ZESTRIL  Take 10 mg by mouth daily.     mometasone 50 MCG/ACT nasal spray  Commonly known as:  NASONEX  Place 2 sprays into the nose daily as needed (Allergies).        Final diagnoses:  Rash and nonspecific skin eruption   I personally performed the services described in this documentation, which was scribed in my presence. The recorded information has been reviewed and is accurate.    Janne Napoleon, [place] 05/05/14 2235

## 2014-05-05 NOTE — ED Provider Notes (Signed)
Medical screening examination/treatment/procedure(s) were conducted as a shared visit with non-physician practitioner(s) and myself.  I personally evaluated the patient during the encounter.   EKG Interpretation None      Pt here with diffuse pruritic macular papular rash.  No blisters, vesicles, fever, systemic sx.  Mild wheezing on exam.  No rash on MM or palms and soles.  New detergent in last month.  No one else in household with sx.  ? Contact dermatitis versus bed bugs.  No interdigital lesions.  Will treat with steroids but also permethrin.  No purpura or petechia.  No sign infection.    Emma MawKristen N Kellianne Ek, DO 05/06/14 [phone]

## 2014-05-05 NOTE — Discharge Instructions (Signed)
Your rash may be due to the change in your detergent but it may be due to scabies. Stop the Benadryl and take the medications we give you. Follow up with your doctor. Return here as needed. Be sure to take your blood pressure medication as directed.

## 2015-06-27 ENCOUNTER — Other Ambulatory Visit (HOSPITAL_COMMUNITY): Payer: Self-pay | Admitting: Nurse Practitioner

## 2015-06-27 DIAGNOSIS — Z1231 Encounter for screening mammogram for malignant neoplasm of breast: Secondary | ICD-10-CM

## 2015-07-14 ENCOUNTER — Ambulatory Visit (HOSPITAL_COMMUNITY): Payer: Self-pay

## 2016-01-06 ENCOUNTER — Other Ambulatory Visit (HOSPITAL_COMMUNITY): Payer: Self-pay | Admitting: Nurse Practitioner

## 2016-01-06 DIAGNOSIS — R011 Cardiac murmur, unspecified: Secondary | ICD-10-CM

## 2016-01-16 ENCOUNTER — Ambulatory Visit (HOSPITAL_COMMUNITY)
Admission: RE | Admit: 2016-01-16 | Discharge: 2016-01-16 | Disposition: A | Discharge status: Home / Self Care | Payer: Self-pay | Source: Ambulatory Visit

## 2016-01-16 ENCOUNTER — Encounter (HOSPITAL_COMMUNITY): Payer: Self-pay

## 2016-03-21 ENCOUNTER — Emergency Department (HOSPITAL_COMMUNITY)
Admission: EM | Admit: 2016-03-21 | Discharge: 2016-03-21 | Disposition: A | Discharge status: Home / Self Care | Payer: BLUE CROSS/BLUE SHIELD | Attending: Emergency Medicine | Admitting: Emergency Medicine

## 2016-03-21 ENCOUNTER — Emergency Department (HOSPITAL_COMMUNITY): Payer: BLUE CROSS/BLUE SHIELD

## 2016-03-21 ENCOUNTER — Encounter (HOSPITAL_COMMUNITY): Payer: Self-pay | Admitting: Emergency Medicine

## 2016-03-21 DIAGNOSIS — R05 Cough: Secondary | ICD-10-CM | POA: Diagnosis present

## 2016-03-21 DIAGNOSIS — I10 Essential (primary) hypertension: Secondary | ICD-10-CM | POA: Diagnosis not present

## 2016-03-21 DIAGNOSIS — J189 Pneumonia, unspecified organism: Secondary | ICD-10-CM | POA: Diagnosis not present

## 2016-03-21 DIAGNOSIS — Z79899 Other long term (current) drug therapy: Secondary | ICD-10-CM | POA: Diagnosis not present

## 2016-03-21 MED ORDER — ALBUTEROL SULFATE HFA 108 (90 BASE) MCG/ACT IN AERS: 1.0000 | INHALATION_SPRAY | Freq: Four times a day (QID) | RESPIRATORY_TRACT | 0 refills | Status: DC | PRN

## 2016-03-21 MED ORDER — LEVOFLOXACIN 500 MG PO TABS: 500.0000 mg | ORAL_TABLET | Freq: Every day | ORAL | 0 refills | Status: DC

## 2016-03-21 MED ORDER — LEVOFLOXACIN 500 MG PO TABS
500.0000 mg | ORAL_TABLET | Freq: Once | ORAL | Status: AC
  Administered 2016-03-21: 500 mg via ORAL
  Filled 2016-03-21: qty 1

## 2016-03-21 NOTE — ED Provider Notes (Signed)
[facility] Provider Note   CSN: [id number] Arrival date & time: 03/21/16  1521  By signing my name below, I, Christy SartoriusAnastasia Kolousek, attest that this documentation has been prepared under the direction and in the presence of  Saraia Platner, PA-C. Electronically Signed: Christy SartoriusAnastasia Kolousek, ED Scribe. 03/21/16. 4:07 PM.  History   Chief Complaint Chief Complaint  Patient presents with  . Cough    The history is provided by the patient and medical records. No language interpreter was used.     HPI Comments:  Emma Morris is a 55 y.o. female with history of asthma who presents to the Emergency Department complaining of cough and nasal congestion for almost 3 weeks.  Her cough is occasionally productive of yellow sputum.  She also notes sore throat and soreness in her ribs with persistent coughing.  She has been taking OTC medications like TheraFlu without relief.  She denies fever, SOB , chest pain and leg swelling.    Past Medical History:  Diagnosis Date  . Hypertension     Patient Active Problem List   Diagnosis Date Noted  . Menometrorrhagia 05/03/2013  . Vaginal bleeding 05/02/2013  . Anemia due to chronic blood loss 05/02/2013    Past Surgical History:  Procedure Laterality Date  . DILITATION & CURRETTAGE/HYSTROSCOPY WITH THERMACHOICE ABLATION N/A 05/04/2013   Procedure: DILATATION & CURETTAGE/HYSTEROSCOPY WITH THERMACHOICE ABLATION;  Surgeon: Lazaro ArmsLuther H Eure, MD;  Location: [facility];  Service: Gynecology;  Laterality: N/A;  temperature:87 degrees; total therapy time:14 minutes and 5 seconds    OB History    Gravida Para Term Preterm AB Living   4 4 3 1   4    SAB TAB Ectopic Multiple Live Births                   Home Medications    Prior to Admission medications   Medication Sig Start Date End Date Taking? Authorizing Provider  famotidine (PEPCID) 20 MG tablet Take 1 tablet (20 mg total) by mouth 2 (two) times daily. 05/05/14   Hope Orlene OchM Neese, NP    hydrochlorothiazide (HYDRODIURIL) 25 MG tablet Take 25 mg by mouth daily.    Historical Provider, MD  hydrOXYzine (ATARAX/VISTARIL) 25 MG tablet Take 1 tablet (25 mg total) by mouth every 6 (six) hours. 05/05/14   Hope Orlene OchM Neese, NP  lisinopril (PRINIVIL,ZESTRIL) 10 MG tablet Take 10 mg by mouth daily.    Historical Provider, MD  mometasone (NASONEX) 50 MCG/ACT nasal spray Place 2 sprays into the nose daily as needed (Allergies).    Historical Provider, MD  permethrin (ELIMITE) 5 % cream Apply to affected area once 05/05/14   [facility], NP  predniSONE (STERAPRED UNI-PAK) 10 MG tablet Take by mouth daily. Starting tomorrow take 5 tablets PO then 4, 3, 2, 1 05/05/14   Janne NapoleonHope M Neese, NP    Family History Family History  Problem Relation Age of Onset  . Cancer Mother   . Kidney disease Father   . Diabetes Father     Social History Social History  Substance Use Topics  . Smoking status: Never Smoker  . Smokeless tobacco: Never Used  . Alcohol use No     Allergies   Patient has no known allergies.   Review of Systems Review of Systems  Constitutional: Negative for activity change, appetite change, chills and fever.  HENT: Positive for congestion, rhinorrhea and sore throat. Negative for facial swelling and trouble swallowing.   Eyes: Negative for visual  disturbance.  Respiratory: Positive for cough. Negative for shortness of breath, wheezing and stridor.   Cardiovascular: Negative for leg swelling.  Gastrointestinal: Negative for nausea and vomiting.  Musculoskeletal: Negative for neck pain and neck stiffness.  Skin: Negative.   Neurological: Negative for dizziness, weakness, numbness and headaches.  Hematological: Negative for adenopathy.  Psychiatric/Behavioral: Negative for confusion.  All other systems reviewed and are negative.    Physical Exam Updated Vital Signs BP 137/78 (BP Location: Left Arm)   Pulse 88   Temp 98.4 F (36.9 C) (Oral)   Resp 16   Ht 5\' 2"   (1.575 m)   Wt 260 lb (117.9 kg)   SpO2 99%   BMI 47.55 kg/m   Physical Exam  Constitutional: She is oriented to person, place, and time. She appears well-developed and well-nourished. No distress.  HENT:  Head: Normocephalic and atraumatic.  Mouth/Throat: Uvula is midline and mucous membranes are normal. No uvula swelling. Posterior oropharyngeal erythema present. No oropharyngeal exudate, posterior oropharyngeal edema or tonsillar abscesses.  Eyes: Conjunctivae are normal.  Neck: Normal range of motion. Neck supple.  Cardiovascular: Normal rate and regular rhythm.   Pulmonary/Chest: Effort normal. No respiratory distress. She has no wheezes. She has no rales.  coarse lung sounds bilaterally. No wheezing no rales.  Musculoskeletal: Normal range of motion.  Lymphadenopathy:    She has no cervical adenopathy.  Neurological: She is alert and oriented to person, place, and time.  Skin: Skin is warm and dry.  Psychiatric: She has a normal mood and affect.  Nursing note and vitals reviewed.    ED Treatments / Results   DIAGNOSTIC STUDIES:  Oxygen Saturation is 99% on RA, NML by my interpretation.    COORDINATION OF CARE:  4:05 PM Will asses pt's imaging.  Discussed treatment plan with pt at bedside and pt agreed to plan.  Labs (all labs ordered are listed, but only abnormal results are displayed) Labs Reviewed - No data to display  EKG  EKG Interpretation None       Radiology Dg Chest 2 View  Result Date: 03/21/2016 CLINICAL DATA:  Cough for 3 weeks, history hypertension EXAM: CHEST  2 VIEW COMPARISON:  05/02/2013 FINDINGS: Enlargement of cardiac silhouette. Mediastinal contours and pulmonary vascularity normal. Minimal peribronchial thickening. Suspected subtle infiltrates RIGHT perihilar and in LEFT lower lobe question pneumonia. Remaining lungs clear. No pleural effusion or pneumothorax. Bones demineralized. IMPRESSION: Enlargement of cardiac silhouette. Suspected  subtle RIGHT perihilar and LEFT lower lobe infiltrates question pneumonia. Electronically Signed   By: Ulyses SouthwardMark  Boles M.D.   On: 03/21/2016 16:00    Procedures Procedures (including critical care time)  Medications Ordered in ED Medications - No data to display   Initial Impression / Assessment and Plan / ED Course  I have reviewed the triage vital signs and the nursing notes.  Pertinent labs & imaging results that were available during my care of the patient were reviewed by me and considered in my medical decision making (see chart for details).  Clinical Course     Patient is well-appearing. Nontoxic Vital signs are stable. No tachycardia, no tachypnea or hypoxia. Chest x-ray results reviewed and discussed with patient. Patient appears stable for discharge, by mouth Levaquin given here and prescription written along with albuterol inhaler she agrees to close PMD follow-up in strict return precautions were given.  Final Clinical Impressions(s) / ED Diagnoses   Final diagnoses:  Community acquired pneumonia, unspecified laterality    New Prescriptions Discharge Medication List  as of 03/21/2016  4:36 PM    START taking these medications   Details  albuterol (PROVENTIL HFA;VENTOLIN HFA) 108 (90 Base) MCG/ACT inhaler Inhale 1-2 puffs into the lungs every 6 (six) hours as needed for wheezing or shortness of breath., Starting Sun 03/21/2016, Print    levofloxacin (LEVAQUIN) 500 MG tablet Take 1 tablet (500 mg total) by mouth daily., Starting Sun 03/21/2016, Print       I personally performed the services described in this documentation, which was scribed in my presence. The recorded information has been reviewed and is accurate.    Pauline Aus, PA-C 03/21/16 2336    Geoffery Lyons, MD 03/22/16 [phone]

## 2016-03-21 NOTE — ED Notes (Signed)
Patient transported to X-ray 

## 2016-03-21 NOTE — ED Triage Notes (Signed)
Pt c/o cough and congestion X 2 weeks. Yellow sputum at times, denies fever. States sore throat and taking Theraflu at home without relief.

## 2016-03-21 NOTE — Discharge Instructions (Signed)
Continue using your albuterol inhaler 1-2 puffs every 4-6 hrs as needed.  Start the Levaquin prescription tomorrow.  Tylenol or ibuprofen every 4-6 hrs for fever.  It's important that you follow-up with your primary doctor later this week for recheck.  Return here for any worsening symptoms such as fever greater than 101.5, or shortness of breath

## 2016-04-06 NOTE — ED Notes (Signed)
Pt came needing a copy of her work note. Copy provided. Pt showed her license up front to registration

## 2016-11-12 ENCOUNTER — Other Ambulatory Visit (HOSPITAL_COMMUNITY): Payer: Self-pay | Admitting: Nurse Practitioner

## 2016-11-12 DIAGNOSIS — Z1231 Encounter for screening mammogram for malignant neoplasm of breast: Secondary | ICD-10-CM

## 2016-11-18 ENCOUNTER — Ambulatory Visit (HOSPITAL_COMMUNITY): Payer: Self-pay

## 2017-04-22 ENCOUNTER — Encounter (HOSPITAL_COMMUNITY): Payer: Self-pay | Admitting: Emergency Medicine

## 2017-04-22 ENCOUNTER — Emergency Department (HOSPITAL_COMMUNITY)
Admission: EM | Admit: 2017-04-22 | Discharge: 2017-04-22 | Disposition: A | Discharge status: Home / Self Care | Payer: BLUE CROSS/BLUE SHIELD | Attending: Emergency Medicine | Admitting: Emergency Medicine

## 2017-04-22 ENCOUNTER — Other Ambulatory Visit: Payer: Self-pay

## 2017-04-22 DIAGNOSIS — I10 Essential (primary) hypertension: Secondary | ICD-10-CM | POA: Diagnosis not present

## 2017-04-22 DIAGNOSIS — R21 Rash and other nonspecific skin eruption: Secondary | ICD-10-CM | POA: Diagnosis present

## 2017-04-22 DIAGNOSIS — B86 Scabies: Secondary | ICD-10-CM | POA: Insufficient documentation

## 2017-04-22 MED ORDER — HYDROXYZINE HCL 25 MG PO TABS
25.0000 mg | ORAL_TABLET | Freq: Once | ORAL | Status: AC
  Administered 2017-04-22: 25 mg via ORAL
  Filled 2017-04-22: qty 1

## 2017-04-22 MED ORDER — PERMETHRIN 5 % EX CREA: TOPICAL_CREAM | CUTANEOUS | 0 refills | Status: DC

## 2017-04-22 MED ORDER — HYDROXYZINE HCL 25 MG PO TABS: 25.0000 mg | ORAL_TABLET | Freq: Four times a day (QID) | ORAL | 0 refills | Status: DC

## 2017-04-22 NOTE — Discharge Instructions (Signed)
Use the cream as directed.  Apply before bedtime and then wash off in the morning.  Wash all bed linens, towels and clothing in hot water.  You may re-treat yourself with the cream in 7 days if needed.  Return if not improving

## 2017-04-22 NOTE — ED Triage Notes (Signed)
Pt reports generalized rash for last several weeks. Pt reports itching is more intense at night. Pt denies any new self care products.

## 2017-04-24 NOTE — ED Provider Notes (Signed)
[facility] EMERGENCY DEPARTMENT Provider Note   CSN: [id number] Arrival date & time: 04/22/17  1802     History   Chief Complaint Chief Complaint  Patient presents with  . Rash    HPI Emma Morris is a 56 y.o. female.  HPI   Emma ReamsJoyce A Canner is a 56 y.o. female who presents to the Emergency Department complaining of generalized rash for nearly 1 month.  She describes a severely pruritic "bumps" that cover most of her body.  States that the itching is more intense at night.  She states she first noticed it on her arms and it has now spread to her chest back and upper legs.  She has not tried any therapies or over-the-counter medications.  She denies pain, fever, chills.  States other family members do not have similar symptoms.  Past Medical History:  Diagnosis Date  . Hypertension     Patient Active Problem List   Diagnosis Date Noted  . Menometrorrhagia 05/03/2013  . Vaginal bleeding 05/02/2013  . Anemia due to chronic blood loss 05/02/2013    Past Surgical History:  Procedure Laterality Date  . DILITATION & CURRETTAGE/HYSTROSCOPY WITH THERMACHOICE ABLATION N/A 05/04/2013   Procedure: DILATATION & CURETTAGE/HYSTEROSCOPY WITH THERMACHOICE ABLATION;  Surgeon: Lazaro ArmsLuther H Eure, MD;  Location: [facility];  Service: Gynecology;  Laterality: N/A;  temperature:87 degrees; total therapy time:14 minutes and 5 seconds    OB History    Gravida Para Term Preterm AB Living   4 4 3 1   4    SAB TAB Ectopic Multiple Live Births                   Home Medications    Prior to Admission medications   Medication Sig Start Date End Date Taking? Authorizing Provider  albuterol (PROVENTIL HFA;VENTOLIN HFA) 108 (90 Base) MCG/ACT inhaler Inhale 1-2 puffs into the lungs every 6 (six) hours as needed for wheezing or shortness of breath. 03/21/16   Namiyah Grantham, PA-C  albuterol (PROVENTIL HFA;VENTOLIN HFA) 108 (90 Base) MCG/ACT inhaler Inhale 2 puffs into the lungs every 6 (six)  hours as needed for wheezing or shortness of breath.    [provider]  famotidine (PEPCID) 20 MG tablet Take 1 tablet (20 mg total) by mouth 2 (two) times daily. 05/05/14   Janne NapoleonNeese, Hope M, NP  hydrOXYzine (ATARAX/VISTARIL) 25 MG tablet Take 1 tablet (25 mg total) by mouth every 6 (six) hours. As needed for itching 04/22/17   Yasin Ducat, PA-C  levofloxacin (LEVAQUIN) 500 MG tablet Take 1 tablet (500 mg total) by mouth daily. 03/21/16   Letishia Elliott, PA-C  lisinopril (PRINIVIL,ZESTRIL) 10 MG tablet Take 10 mg by mouth daily.    [provider]  permethrin (ELIMITE) 5 % cream Apply to most of the body, leave on for 8-10 hrs then wash off.  May re-apply in 7 days if needed 04/22/17   Pauline Ausriplett, Rayshawn Visconti, PA-C    Family History Family History  Problem Relation Age of Onset  . Cancer Mother   . Kidney disease Father   . Diabetes Father     Social History Social History   Tobacco Use  . Smoking status: Never Smoker  . Smokeless tobacco: Never Used  Substance Use Topics  . Alcohol use: No  . Drug use: No     Allergies   Patient has no known allergies.   Review of Systems Review of Systems  Constitutional: Negative for activity change, appetite change, chills and  fever.  HENT: Negative for facial swelling, sore throat and trouble swallowing.   Respiratory: Negative for chest tightness, shortness of breath and wheezing.   Cardiovascular: Negative for chest pain.  Musculoskeletal: Negative for neck pain and neck stiffness.  Skin: Positive for rash. Negative for wound.  Neurological: Negative for dizziness, weakness, numbness and headaches.  All other systems reviewed and are negative.    Physical Exam Updated Vital Signs BP (!) 190/103 (BP Location: Right Arm)   Pulse 87   Temp 97.9 F (36.6 C) (Oral)   Resp 19   Ht 5\' 2"  (1.575 m)   Wt 117.9 kg (260 lb)   SpO2 100%   BMI 47.55 kg/m   Physical Exam  Constitutional: She is oriented to person, place,  and time. She appears well-developed and well-nourished. No distress.  HENT:  Head: Normocephalic and atraumatic.  Mouth/Throat: Oropharynx is clear and moist.  Eyes: Conjunctivae are normal.  Neck: Normal range of motion. Neck supple.  Cardiovascular: Normal rate, regular rhythm and intact distal pulses.  No murmur heard. Pulmonary/Chest: Effort normal and breath sounds normal. No respiratory distress.  Musculoskeletal: She exhibits no edema or tenderness.  Lymphadenopathy:    She has no cervical adenopathy.  Neurological: She is alert and oriented to person, place, and time. No sensory deficit. She exhibits normal muscle tone. Coordination normal.  Skin: Skin is warm. Capillary refill takes less than 2 seconds. Rash noted. There is erythema.  Multiple small erythematous papules to the trunk and bilateral upper and lower extremities.  Multiple excoriations to both arms.  No edema.  No drainage.  Feet and palms are spared.  Nursing note and vitals reviewed.    ED Treatments / Results  Labs (all labs ordered are listed, but only abnormal results are displayed) Labs Reviewed - No data to display  EKG  EKG Interpretation None       Radiology No results found.  Procedures Procedures (including critical care time)  Medications Ordered in ED Medications  hydrOXYzine (ATARAX/VISTARIL) tablet 25 mg (25 mg Oral Given 04/22/17 1850)     Initial Impression / Assessment and Plan / ED Course  I have reviewed the triage vital signs and the nursing notes.  Pertinent labs & imaging results that were available during my care of the patient were reviewed by me and considered in my medical decision making (see chart for details).     Patient is well-appearing.  Rash appears consistent with scabies.  Patient agrees to treatment plan with permethrin cream and Vistaril for itching.  Return precautions discussed  Final Clinical Impressions(s) / ED Diagnoses   Final diagnoses:  Scabies      ED Discharge Orders        Ordered    permethrin (ELIMITE) 5 % cream     04/22/17 1843    hydrOXYzine (ATARAX/VISTARIL) 25 MG tablet  Every 6 hours     04/22/17 1843       Pauline Ausriplett, Matha Masse, PA-C 04/24/17 1320    Raeford RazorKohut, Stephen, MD 04/24/17 1609

## 2019-05-14 ENCOUNTER — Other Ambulatory Visit (HOSPITAL_COMMUNITY): Payer: Self-pay | Admitting: Family

## 2019-05-14 DIAGNOSIS — Z1231 Encounter for screening mammogram for malignant neoplasm of breast: Secondary | ICD-10-CM

## 2019-05-21 ENCOUNTER — Encounter: Payer: Self-pay | Admitting: Gastroenterology

## 2019-06-19 ENCOUNTER — Telehealth: Payer: Self-pay | Admitting: Gastroenterology

## 2019-06-19 ENCOUNTER — Encounter: Payer: Self-pay | Admitting: Gastroenterology

## 2019-06-19 ENCOUNTER — Ambulatory Visit: Payer: BLUE CROSS/BLUE SHIELD | Admitting: Gastroenterology

## 2019-06-19 NOTE — Telephone Encounter (Signed)
Patient was a no show and letter sent  °

## 2019-07-14 ENCOUNTER — Emergency Department (HOSPITAL_COMMUNITY): Payer: Self-pay

## 2019-07-14 ENCOUNTER — Other Ambulatory Visit: Payer: Self-pay

## 2019-07-14 ENCOUNTER — Emergency Department (HOSPITAL_COMMUNITY)
Admission: EM | Admit: 2019-07-14 | Discharge: 2019-07-14 | Disposition: A | Discharge status: Home / Self Care | Payer: Self-pay | Attending: Emergency Medicine | Admitting: Emergency Medicine

## 2019-07-14 ENCOUNTER — Encounter (HOSPITAL_COMMUNITY): Payer: Self-pay | Admitting: Emergency Medicine

## 2019-07-14 DIAGNOSIS — R531 Weakness: Secondary | ICD-10-CM | POA: Insufficient documentation

## 2019-07-14 DIAGNOSIS — R609 Edema, unspecified: Secondary | ICD-10-CM

## 2019-07-14 DIAGNOSIS — R600 Localized edema: Secondary | ICD-10-CM | POA: Insufficient documentation

## 2019-07-14 DIAGNOSIS — R05 Cough: Secondary | ICD-10-CM | POA: Insufficient documentation

## 2019-07-14 DIAGNOSIS — I10 Essential (primary) hypertension: Secondary | ICD-10-CM | POA: Insufficient documentation

## 2019-07-14 DIAGNOSIS — Z79899 Other long term (current) drug therapy: Secondary | ICD-10-CM | POA: Insufficient documentation

## 2019-07-14 DIAGNOSIS — R5383 Other fatigue: Secondary | ICD-10-CM | POA: Insufficient documentation

## 2019-07-14 DIAGNOSIS — Z20822 Contact with and (suspected) exposure to covid-19: Secondary | ICD-10-CM | POA: Insufficient documentation

## 2019-07-14 DIAGNOSIS — M791 Myalgia, unspecified site: Secondary | ICD-10-CM | POA: Insufficient documentation

## 2019-07-14 LAB — CBC WITH DIFFERENTIAL/PLATELET
Abs Immature Granulocytes: 0.02 10*3/uL (ref 0.00–0.07)
Basophils Absolute: 0 10*3/uL (ref 0.0–0.1)
Basophils Relative: 0 %
Eosinophils Absolute: 0.2 10*3/uL (ref 0.0–0.5)
Eosinophils Relative: 4 %
HCT: 31.7 % — ABNORMAL LOW (ref 36.0–46.0)
Hemoglobin: 9.4 g/dL — ABNORMAL LOW (ref 12.0–15.0)
Immature Granulocytes: 0 %
Lymphocytes Relative: 20 %
Lymphs Abs: 1.1 10*3/uL (ref 0.7–4.0)
MCH: 24 pg — ABNORMAL LOW (ref 26.0–34.0)
MCHC: 29.7 g/dL — ABNORMAL LOW (ref 30.0–36.0)
MCV: 81.1 fL (ref 80.0–100.0)
Monocytes Absolute: 0.3 10*3/uL (ref 0.1–1.0)
Monocytes Relative: 6 %
Neutro Abs: 3.9 10*3/uL (ref 1.7–7.7)
Neutrophils Relative %: 70 %
Platelets: 314 10*3/uL (ref 150–400)
RBC: 3.91 MIL/uL (ref 3.87–5.11)
RDW: 17.7 % — ABNORMAL HIGH (ref 11.5–15.5)
WBC: 5.6 10*3/uL (ref 4.0–10.5)
nRBC: 0 % (ref 0.0–0.2)

## 2019-07-14 LAB — COMPREHENSIVE METABOLIC PANEL
ALT: 17 U/L (ref 0–44)
AST: 23 U/L (ref 15–41)
Albumin: 3.3 g/dL — ABNORMAL LOW (ref 3.5–5.0)
Alkaline Phosphatase: 52 U/L (ref 38–126)
Anion gap: 7 (ref 5–15)
BUN: 12 mg/dL (ref 6–20)
CO2: 27 mmol/L (ref 22–32)
Calcium: 8.8 mg/dL — ABNORMAL LOW (ref 8.9–10.3)
Chloride: 102 mmol/L (ref 98–111)
Creatinine, Ser: 0.58 mg/dL (ref 0.44–1.00)
GFR calc Af Amer: >60 mL/min (ref >60)
GFR calc non Af Amer: >60 mL/min (ref >60)
Glucose, Bld: 87 mg/dL (ref 70–99)
Potassium: 3.5 mmol/L (ref 3.5–5.1)
Sodium: 136 mmol/L (ref 135–145)
Total Bilirubin: 0.4 mg/dL (ref 0.3–1.2)
Total Protein: 9.4 g/dL — ABNORMAL HIGH (ref 6.5–8.1)

## 2019-07-14 LAB — URINALYSIS, ROUTINE W REFLEX MICROSCOPIC
Bilirubin Urine: NEGATIVE
Glucose, UA: NEGATIVE mg/dL
Hgb urine dipstick: NEGATIVE
Ketones, ur: NEGATIVE mg/dL
Leukocytes,Ua: NEGATIVE
Nitrite: NEGATIVE
Protein, ur: NEGATIVE mg/dL
Specific Gravity, Urine: 1.015 (ref 1.005–1.030)
pH: 7 (ref 5.0–8.0)

## 2019-07-14 LAB — BRAIN NATRIURETIC PEPTIDE: B Natriuretic Peptide: 48 pg/mL (ref 0.0–100.0)

## 2019-07-14 LAB — POC SARS CORONAVIRUS 2 AG -  ED: SARS Coronavirus 2 Ag: NEGATIVE

## 2019-07-14 MED ORDER — FUROSEMIDE 20 MG PO TABS: 20.0000 mg | ORAL_TABLET | Freq: Every day | ORAL | 0 refills | Status: DC

## 2019-07-14 MED ORDER — MELOXICAM 7.5 MG PO TABS: 7.5000 mg | ORAL_TABLET | Freq: Two times a day (BID) | ORAL | 0 refills | Status: AC | PRN

## 2019-07-14 NOTE — Discharge Instructions (Addendum)
Please see the CT scan report that I have attached below.  Please share this with your family doctor this week.  You are to take the following medications when you go home to help with both body aches and with some of the swelling in your legs  Mobic 7.5 mg every 12 hours as needed for body aches, do not combine this with other anti-inflammatory such as ibuprofen Advil Aleve or aspirin Lasix, 20 mg once a day for the next 7 days, this will help to get some of the fluid off  Please seek medical exam for any severe or worsening symptoms but make sure that you follow-up with your doctor within 48 hours for recheck and to finish the ongoing work-up for your body aches which may be related to any number of different conditions.  We have not been able to identify the cause today but it does not appear to be a severe or life-threatening cause.  If things do get worse please come back to the hospital immediately  CT Chest Wo Contrast (Final result) Result time 07/14/19 16:50:05 Final result by Jeronimo Greaves, MD (07/14/19 16:50:05)           Narrative:   CLINICAL DATA:  Weakness. Fatigue. Lower extremity swelling.  Left-sided pain. Hypertension.   EXAM:  CT CHEST WITHOUT CONTRAST   TECHNIQUE:  Multidetector CT imaging of the chest was performed following the  standard protocol without IV contrast.   COMPARISON:  Chest radiograph of earlier today.   FINDINGS:  Cardiovascular: Moderate degradation secondary to patient body  habitus and mild motion.   Aortic atherosclerosis. Tortuous thoracic aorta. Moderate  cardiomegaly, without pericardial effusion. Pulmonary artery  enlargement, outflow tract 3.4 cm.   Mediastinum/Nodes: Prominent bilateral axillary nodes with  maintenance of fatty hila. Example 1.4 cm left-sided node on 32/2.  Likely reactive.   No mediastinal or definite hilar adenopathy, given limitations of  unenhanced CT. Tiny hiatal hernia.   Lungs/Pleura: No pleural fluid.  No pleural fluid. No typical  findings of pulmonary edema. Areas of mild peribronchovascular  ground-glass are identified in both upper lobes. Foci of  interstitial thickening are basilar predominant, likely related to  is hypoventilation and subsegmental atelectasis.   Upper Abdomen: Normal imaged portions of the liver, spleen,  pancreas, adrenal glands, kidneys. Gallstones of up to 1.7 cm.   Musculoskeletal: Lower thoracic spondylosis.   IMPRESSION:  1. Moderately degraded exam, secondary to patient body habitus and  less so motion.  2. Multifocal pulmonary opacities. Many of these are favored to  represent subsegmental atelectasis in the setting of  hypoventilation. Suggestion of ground-glass in the  peribronchovascular upper lobes, for which mild infection cannot be  excluded. Pulmonary edema is felt unlikely, especially given absence  of pleural fluid .  3. Cholelithiasis  4.  Aortic Atherosclerosis (ICD10-I70.0).  5. Pulmonary artery enlargement suggests pulmonary arterial  hypertension.  6. Cardiomegaly.  7.  Tiny hiatal hernia.

## 2019-07-14 NOTE — ED Notes (Signed)
No signature obtained at discharge, instructions reviewed with pt at bedside.

## 2019-07-14 NOTE — ED Triage Notes (Signed)
Pt states she hurts all over and is unable to raise her hands at all. Has been worsening over the past month with more difficulty walking due to pain.

## 2019-07-14 NOTE — ED Provider Notes (Signed)
[facility] EMERGENCY DEPARTMENT Provider Note   CSN: [id number] Arrival date & time: 07/14/19  1233     History Chief Complaint  Patient presents with  . Muscle Pain    Emma Morris is a 59 y.o. female.  Pt complains of leg swelling left worse than right.  Pt reports she feels weak and tired.  Pt denies fever, no cough.  Pt feels short of breath with exertion.  Pt reports her arms feel sore and sometimes feel weak.  Pt reports she has seen her MD for the same.   The history is provided by the patient. No language interpreter was used.  Leg Pain Location:  Leg Leg location:  L upper leg and R upper leg Pain details:    Quality:  Aching   Radiates to:  Does not radiate   Onset quality:  Gradual Relieved by:  Nothing Worsened by:  Nothing Ineffective treatments:  None tried Risk factors: no concern for non-accidental trauma and no recent illness    Pt reports she has been feeling bad for a while     Past Medical History:  Diagnosis Date  . Hypertension     Patient Active Problem List   Diagnosis Date Noted  . Menometrorrhagia 05/03/2013  . Vaginal bleeding 05/02/2013  . Anemia due to chronic blood loss 05/02/2013    Past Surgical History:  Procedure Laterality Date  . DILITATION & CURRETTAGE/HYSTROSCOPY WITH THERMACHOICE ABLATION N/A 05/04/2013   Procedure: DILATATION & CURETTAGE/HYSTEROSCOPY WITH THERMACHOICE ABLATION;  Surgeon: Lazaro Arms, MD;  Location: [facility];  Service: Gynecology;  Laterality: N/A;  temperature:87 degrees; total therapy time:14 minutes and 5 seconds     OB History    Gravida  4   Para  4   Term  3   Preterm  1   AB      Living  4     SAB      TAB      Ectopic      Multiple      Live Births              Family History  Problem Relation Age of Onset  . Cancer Mother   . Kidney disease Father   . Diabetes Father     Social History   Tobacco Use  . Smoking status: Never Smoker  . Smokeless tobacco:  Never Used  Substance Use Topics  . Alcohol use: No  . Drug use: No    Home Medications Prior to Admission medications   Medication Sig Start Date End Date Taking? Authorizing Provider  amLODipine (NORVASC) 10 MG tablet Take 10 mg by mouth daily. 05/17/19  Yes [provider]  hydrochlorothiazide (HYDRODIURIL) 25 MG tablet Take 25 mg by mouth daily. 05/17/19  Yes [provider]  albuterol (PROVENTIL HFA;VENTOLIN HFA) 108 (90 Base) MCG/ACT inhaler Inhale 1-2 puffs into the lungs every 6 (six) hours as needed for wheezing or shortness of breath. Patient not taking: Reported on 07/14/2019 03/21/16   Triplett, Tammy, PA-C  albuterol (PROVENTIL HFA;VENTOLIN HFA) 108 (90 Base) MCG/ACT inhaler Inhale 2 puffs into the lungs every 6 (six) hours as needed for wheezing or shortness of breath.    [provider]  famotidine (PEPCID) 20 MG tablet Take 1 tablet (20 mg total) by mouth 2 (two) times daily. Patient not taking: Reported on 07/14/2019 05/05/14   Janne Napoleon, NP  hydrOXYzine (ATARAX/VISTARIL) 25 MG tablet Take 1 tablet (25 mg total) by  mouth every 6 (six) hours. As needed for itching Patient not taking: Reported on 07/14/2019 04/22/17   Triplett, Tammy, PA-C  levofloxacin (LEVAQUIN) 500 MG tablet Take 1 tablet (500 mg total) by mouth daily. Patient not taking: Reported on 07/14/2019 03/21/16   Kem Parkinson, PA-C  permethrin (ELIMITE) 5 % cream Apply to most of the body, leave on for 8-10 hrs then wash off.  May re-apply in 7 days if needed Patient not taking: Reported on 07/14/2019 04/22/17   Kem Parkinson, PA-C    Allergies    Patient has no known allergies.  Review of Systems   Review of Systems  All other systems reviewed and are negative.   Physical Exam Updated Vital Signs BP (!) 153/101   Pulse 90   Temp 98 F (36.7 C) (Oral)   Resp (!) 22   Ht 5\' 2"  (1.575 m)   Wt 121.6 kg   SpO2 98%   BMI 49.02 kg/m   Physical Exam Vitals and nursing note  reviewed.  Constitutional:      Appearance: She is well-developed.  HENT:     Head: Normocephalic.     Mouth/Throat:     Mouth: Mucous membranes are moist.  Cardiovascular:     Rate and Rhythm: Normal rate.     Pulses: Normal pulses.  Pulmonary:     Effort: Pulmonary effort is normal.  Abdominal:     General: Abdomen is flat. There is no distension.  Musculoskeletal:        General: Normal range of motion.     Cervical back: Normal range of motion.     Right lower leg: Edema present.     Left lower leg: Edema present.  Skin:    General: Skin is warm.  Neurological:     General: No focal deficit present.     Mental Status: She is alert and oriented to person, place, and time.     ED Results / Procedures / Treatments   Labs (all labs ordered are listed, but only abnormal results are displayed) Labs Reviewed  CBC WITH DIFFERENTIAL/PLATELET - Abnormal; Notable for the following components:      Result Value   Hemoglobin 9.4 (*)    HCT 31.7 (*)    MCH 24.0 (*)    MCHC 29.7 (*)    RDW 17.7 (*)    All other components within normal limits  COMPREHENSIVE METABOLIC PANEL - Abnormal; Notable for the following components:   Calcium 8.8 (*)    Total Protein 9.4 (*)    Albumin 3.3 (*)    All other components within normal limits  URINALYSIS, ROUTINE W REFLEX MICROSCOPIC  BRAIN NATRIURETIC PEPTIDE  POC SARS CORONAVIRUS 2 AG -  ED    EKG None  Radiology DG Chest Port 1 View  Result Date: 07/14/2019 CLINICAL DATA:  Weakness EXAM: PORTABLE CHEST 1 VIEW COMPARISON:  03/21/2016 FINDINGS: Cardiomegaly. Prominent pulmonary vasculature and heterogeneous and interstitial airspace opacity throughout. The visualized skeletal structures are unremarkable. IMPRESSION: Cardiomegaly with prominent pulmonary vascular trigger and heterogeneous and interstitial airspace opacity throughout. Findings likely reflect pulmonary edema, although multifocal infection is a differential consideration.  Electronically Signed   By: Eddie Candle M.D.   On: 07/14/2019 14:47    Procedures Procedures (including critical care time)  Medications Ordered in ED Medications - No data to display  ED Course  I have reviewed the triage vital signs and the nursing notes.  Pertinent labs & imaging results that were available during  my care of the patient were reviewed by me and considered in my medical decision making (see chart for details).    MDM Rules/Calculators/A&P                      MDM  BNP is 48.  Covid negative  Dr. Hyacinth Meeker in to see and examine.   Pt's care turned over to Dr. Hyacinth Meeker  Ct chest pending Final Clinical Impression(s) / ED Diagnoses Final diagnoses:  Myalgia    Rx / DC Orders ED Discharge Orders    None       Osie Cheeks 07/14/19 1621    Eber Hong, MD 07/14/19 [phone]

## 2019-07-14 NOTE — ED Notes (Addendum)
Pt out of bed ambulating heel to toe to bathroom

## 2019-07-24 ENCOUNTER — Encounter: Payer: Self-pay | Admitting: Gastroenterology

## 2019-07-25 ENCOUNTER — Ambulatory Visit (HOSPITAL_COMMUNITY)
Admission: RE | Admit: 2019-07-25 | Discharge: 2019-07-25 | Disposition: A | Discharge status: Home / Self Care | Payer: Self-pay | Source: Ambulatory Visit | Attending: Family | Admitting: Family

## 2019-07-25 ENCOUNTER — Other Ambulatory Visit: Payer: Self-pay

## 2019-07-25 DIAGNOSIS — Z1231 Encounter for screening mammogram for malignant neoplasm of breast: Secondary | ICD-10-CM | POA: Insufficient documentation

## 2019-08-01 ENCOUNTER — Inpatient Hospital Stay
Admission: RE | Admit: 2019-08-01 | Discharge: 2019-08-01 | Disposition: A | Discharge status: Home / Self Care | Payer: Self-pay | Source: Ambulatory Visit | Attending: Family | Admitting: Family

## 2019-08-01 ENCOUNTER — Other Ambulatory Visit (HOSPITAL_COMMUNITY): Payer: Self-pay | Admitting: Family

## 2019-08-01 DIAGNOSIS — Z1231 Encounter for screening mammogram for malignant neoplasm of breast: Secondary | ICD-10-CM

## 2019-08-02 ENCOUNTER — Other Ambulatory Visit (HOSPITAL_COMMUNITY): Payer: Self-pay | Admitting: Family

## 2019-08-03 ENCOUNTER — Other Ambulatory Visit (HOSPITAL_COMMUNITY): Payer: Self-pay | Admitting: Family

## 2019-08-03 DIAGNOSIS — R928 Other abnormal and inconclusive findings on diagnostic imaging of breast: Secondary | ICD-10-CM

## 2019-08-08 ENCOUNTER — Encounter: Payer: Self-pay | Admitting: Gastroenterology

## 2019-08-08 ENCOUNTER — Telehealth: Payer: Self-pay | Admitting: Gastroenterology

## 2019-08-08 ENCOUNTER — Ambulatory Visit: Payer: Self-pay | Admitting: Gastroenterology

## 2019-08-08 NOTE — Telephone Encounter (Signed)
PATIENT WAS A NO SHOW AND LETTER SENT  °

## 2019-08-21 ENCOUNTER — Other Ambulatory Visit: Payer: Self-pay

## 2019-08-21 ENCOUNTER — Ambulatory Visit (HOSPITAL_COMMUNITY)
Admission: RE | Admit: 2019-08-21 | Discharge: 2019-08-21 | Disposition: A | Discharge status: Home / Self Care | Payer: Self-pay | Source: Ambulatory Visit | Attending: Family | Admitting: Family

## 2019-08-21 DIAGNOSIS — R928 Other abnormal and inconclusive findings on diagnostic imaging of breast: Secondary | ICD-10-CM

## 2019-09-06 ENCOUNTER — Ambulatory Visit: Payer: Self-pay | Admitting: Gastroenterology

## 2019-09-24 ENCOUNTER — Telehealth: Payer: Self-pay | Admitting: *Deleted

## 2019-09-24 NOTE — Telephone Encounter (Signed)
Pt consented to a virtual visit. 

## 2019-09-24 NOTE — Telephone Encounter (Signed)
Emma Morris, you are scheduled for a virtual visit with your provider today.  Just as we do with appointments in the office, we must obtain your consent to participate.  Your consent will be active for this visit and any virtual visit you may have with one of our providers in the next 365 days.  If you have a MyChart account, I can also send a copy of this consent to you electronically.  All virtual visits are billed to your insurance company just like a traditional visit in the office.  As this is a virtual visit, video technology does not allow for your provider to perform a traditional examination.  This may limit your provider's ability to fully assess your condition.  If your provider identifies any concerns that need to be evaluated in person or the need to arrange testing such as labs, EKG, etc, we will make arrangements to do so.  Although advances in technology are sophisticated, we cannot ensure that it will always work on either your end or our end.  If the connection with a video visit is poor, we may have to switch to a telephone visit.  With either a video or telephone visit, we are not always able to ensure that we have a secure connection.   I need to obtain your verbal consent now.   Are you willing to proceed with your visit today?

## 2019-09-27 ENCOUNTER — Encounter: Payer: Self-pay | Admitting: Gastroenterology

## 2019-09-27 ENCOUNTER — Telehealth (INDEPENDENT_AMBULATORY_CARE_PROVIDER_SITE_OTHER): Payer: Self-pay | Admitting: Gastroenterology

## 2019-09-27 ENCOUNTER — Other Ambulatory Visit: Payer: Self-pay

## 2019-09-27 DIAGNOSIS — Z1211 Encounter for screening for malignant neoplasm of colon: Secondary | ICD-10-CM | POA: Insufficient documentation

## 2019-09-27 NOTE — Progress Notes (Signed)
Unable to complete MyChart Video Visit due to technical issues. As she is a new patient, will need to have visual capabilities. She will present to the office for an in-person visit.

## 2019-10-24 ENCOUNTER — Ambulatory Visit: Payer: Self-pay | Admitting: Gastroenterology

## 2019-10-24 ENCOUNTER — Telehealth: Payer: Self-pay | Admitting: Internal Medicine

## 2019-10-24 NOTE — Telephone Encounter (Signed)
PATIENT WAS A NO SHOW 3 TIMES FOR NEW PATIENT APPOINTMENT

## 2019-10-27 NOTE — Telephone Encounter (Signed)
Reviewed

## 2020-01-29 ENCOUNTER — Other Ambulatory Visit: Payer: Self-pay

## 2020-01-29 ENCOUNTER — Emergency Department (HOSPITAL_COMMUNITY)
Admission: EM | Admit: 2020-01-29 | Discharge: 2020-01-29 | Disposition: A | Discharge status: Home / Self Care | Payer: Self-pay | Attending: Emergency Medicine | Admitting: Emergency Medicine

## 2020-01-29 ENCOUNTER — Emergency Department (HOSPITAL_COMMUNITY): Payer: Self-pay

## 2020-01-29 ENCOUNTER — Encounter (HOSPITAL_COMMUNITY): Payer: Self-pay

## 2020-01-29 DIAGNOSIS — Z20822 Contact with and (suspected) exposure to covid-19: Secondary | ICD-10-CM | POA: Insufficient documentation

## 2020-01-29 DIAGNOSIS — Z79899 Other long term (current) drug therapy: Secondary | ICD-10-CM | POA: Insufficient documentation

## 2020-01-29 DIAGNOSIS — M2550 Pain in unspecified joint: Secondary | ICD-10-CM | POA: Insufficient documentation

## 2020-01-29 DIAGNOSIS — J189 Pneumonia, unspecified organism: Secondary | ICD-10-CM | POA: Insufficient documentation

## 2020-01-29 DIAGNOSIS — I10 Essential (primary) hypertension: Secondary | ICD-10-CM | POA: Insufficient documentation

## 2020-01-29 LAB — CBC WITH DIFFERENTIAL/PLATELET
Abs Immature Granulocytes: 0.02 10*3/uL (ref 0.00–0.07)
Basophils Absolute: 0 10*3/uL (ref 0.0–0.1)
Basophils Relative: 0 %
Eosinophils Absolute: 0.3 10*3/uL (ref 0.0–0.5)
Eosinophils Relative: 5 %
HCT: 34.8 % — ABNORMAL LOW (ref 36.0–46.0)
Hemoglobin: 10.3 g/dL — ABNORMAL LOW (ref 12.0–15.0)
Immature Granulocytes: 0 %
Lymphocytes Relative: 18 %
Lymphs Abs: 1 10*3/uL (ref 0.7–4.0)
MCH: 23.9 pg — ABNORMAL LOW (ref 26.0–34.0)
MCHC: 29.6 g/dL — ABNORMAL LOW (ref 30.0–36.0)
MCV: 80.7 fL (ref 80.0–100.0)
Monocytes Absolute: 0.4 10*3/uL (ref 0.1–1.0)
Monocytes Relative: 7 %
Neutro Abs: 3.8 10*3/uL (ref 1.7–7.7)
Neutrophils Relative %: 70 %
Platelets: 344 10*3/uL (ref 150–400)
RBC: 4.31 MIL/uL (ref 3.87–5.11)
RDW: 18.6 % — ABNORMAL HIGH (ref 11.5–15.5)
WBC: 5.4 10*3/uL (ref 4.0–10.5)
nRBC: 0 % (ref 0.0–0.2)

## 2020-01-29 LAB — COMPREHENSIVE METABOLIC PANEL
ALT: 12 U/L (ref 0–44)
AST: 12 U/L — ABNORMAL LOW (ref 15–41)
Albumin: 3.2 g/dL — ABNORMAL LOW (ref 3.5–5.0)
Alkaline Phosphatase: 42 U/L (ref 38–126)
Anion gap: 7 (ref 5–15)
BUN: 10 mg/dL (ref 6–20)
CO2: 29 mmol/L (ref 22–32)
Calcium: 8.8 mg/dL — ABNORMAL LOW (ref 8.9–10.3)
Chloride: 101 mmol/L (ref 98–111)
Creatinine, Ser: 0.66 mg/dL (ref 0.44–1.00)
GFR calc Af Amer: >60 mL/min (ref >60)
GFR calc non Af Amer: >60 mL/min (ref >60)
Glucose, Bld: 85 mg/dL (ref 70–99)
Potassium: 3.4 mmol/L — ABNORMAL LOW (ref 3.5–5.1)
Sodium: 137 mmol/L (ref 135–145)
Total Bilirubin: 0.5 mg/dL (ref 0.3–1.2)
Total Protein: 9 g/dL — ABNORMAL HIGH (ref 6.5–8.1)

## 2020-01-29 LAB — SARS CORONAVIRUS 2 BY RT PCR (HOSPITAL ORDER, PERFORMED IN ~~LOC~~ HOSPITAL LAB): SARS Coronavirus 2: NEGATIVE

## 2020-01-29 MED ORDER — AZITHROMYCIN 250 MG PO TABS: ORAL_TABLET | ORAL | 0 refills | Status: AC

## 2020-01-29 MED ORDER — CEFTRIAXONE SODIUM 1 G IJ SOLR
1.0000 g | Freq: Once | INTRAMUSCULAR | Status: AC
  Administered 2020-01-29: 1 g via INTRAMUSCULAR
  Filled 2020-01-29: qty 10

## 2020-01-29 MED ORDER — MELOXICAM 15 MG PO TABS: 15.0000 mg | ORAL_TABLET | Freq: Every day | ORAL | 2 refills | Status: DC

## 2020-01-29 MED ORDER — AZITHROMYCIN 250 MG PO TABS
500.0000 mg | ORAL_TABLET | Freq: Once | ORAL | Status: AC
  Administered 2020-01-29: 500 mg via ORAL
  Filled 2020-01-29: qty 2

## 2020-01-29 MED ORDER — LIDOCAINE HCL (PF) 1 % IJ SOLN
INTRAMUSCULAR | Status: AC
  Administered 2020-01-29: 2 mL
  Filled 2020-01-29: qty 5

## 2020-01-29 NOTE — ED Triage Notes (Signed)
Pt reports r arm and r knee pain x 3 months, denies injury.  Says are hurts to raisd it.   Reports cough that started yesterday.  Denies fever.

## 2020-01-29 NOTE — Discharge Instructions (Signed)
Return if any problems.  See your Physician for recheck in 2-3 days °

## 2020-01-29 NOTE — ED Provider Notes (Signed)
[facility] EMERGENCY DEPARTMENT Provider Note   CSN: [id number] Arrival date & time: 01/29/20  4098     History Chief Complaint  Patient presents with  . Cough    Emma Morris is a 59 y.o. female.  The history is provided by the patient. No language interpreter was used.  Cough Cough characteristics:  Non-productive Sputum characteristics:  Nondescript Severity:  Moderate Onset quality:  Gradual Duration:  12 weeks Timing:  Constant Progression:  Worsening Chronicity:  New Smoker: no   Relieved by:  Nothing Worsened by:  Nothing Ineffective treatments:  None tried Associated symptoms: no rhinorrhea and no shortness of breath        Past Medical History:  Diagnosis Date  . Hypertension     Patient Active Problem List   Diagnosis Date Noted  . Encounter for screening colonoscopy 09/27/2019  . Menometrorrhagia 05/03/2013  . Vaginal bleeding 05/02/2013  . Anemia due to chronic blood loss 05/02/2013    Past Surgical History:  Procedure Laterality Date  . DILITATION & CURRETTAGE/HYSTROSCOPY WITH THERMACHOICE ABLATION N/A 05/04/2013   Procedure: DILATATION & CURETTAGE/HYSTEROSCOPY WITH THERMACHOICE ABLATION;  Surgeon: Lazaro Arms, MD;  Location: [facility];  Service: Gynecology;  Laterality: N/A;  temperature:87 degrees; total therapy time:14 minutes and 5 seconds     OB History    Gravida  4   Para  4   Term  3   Preterm  1   AB      Living  4     SAB      TAB      Ectopic      Multiple      Live Births              Family History  Problem Relation Age of Onset  . Cancer Mother   . Kidney disease Father   . Diabetes Father     Social History   Tobacco Use  . Smoking status: Never Smoker  . Smokeless tobacco: Never Used  Substance Use Topics  . Alcohol use: No  . Drug use: No    Home Medications Prior to Admission medications   Medication Sig Start Date End Date Taking? Authorizing Provider  acetaminophen (TYLENOL)  500 MG tablet Take 500 mg by mouth as needed.    [provider]  albuterol (PROVENTIL HFA;VENTOLIN HFA) 108 (90 Base) MCG/ACT inhaler Inhale 1-2 puffs into the lungs every 6 (six) hours as needed for wheezing or shortness of breath. Patient not taking: Reported on 07/14/2019 03/21/16   Triplett, Tammy, PA-C  albuterol (PROVENTIL HFA;VENTOLIN HFA) 108 (90 Base) MCG/ACT inhaler Inhale 2 puffs into the lungs every 6 (six) hours as needed for wheezing or shortness of breath.    [provider]  amLODipine (NORVASC) 10 MG tablet Take 10 mg by mouth daily. 05/17/19   [provider]  azithromycin (ZITHROMAX Z-PAK) 250 MG tablet Take 2 tablets (500 mg) on  Day 1,  followed by 1 tablet (250 mg) once daily on Days 2 through 5. 01/29/20 02/03/20  Elson Areas, PA-C  furosemide (LASIX) 20 MG tablet Take 1 tablet (20 mg total) by mouth daily for 7 days. Patient not taking: Reported on 09/27/2019 07/14/19 07/21/19  Eber Hong, MD  hydrochlorothiazide (HYDRODIURIL) 25 MG tablet Take 25 mg by mouth daily. 05/17/19   [provider]  meloxicam (MOBIC) 15 MG tablet Take 1 tablet (15 mg total) by mouth daily. 01/29/20 01/28/21  Elson Areas, PA-C  famotidine (PEPCID) 20 MG tablet Take 1 tablet (20 mg total) by mouth 2 (two) times daily. Patient not taking: Reported on 07/14/2019 05/05/14 07/14/19  Janne Napoleon, NP    Allergies    Patient has no known allergies.  Review of Systems   Review of Systems  HENT: Negative for rhinorrhea.   Respiratory: Positive for cough. Negative for shortness of breath.   All other systems reviewed and are negative.   Physical Exam Updated Vital Signs BP (!) 144/80   Pulse (!) 103   Temp 98.1 F (36.7 C)   Resp 18   Ht 5\' 1"  (1.549 m)   Wt 122.5 kg   SpO2 96%   BMI 51.02 kg/m   Physical Exam Vitals and nursing note reviewed.  Constitutional:      General: She is not in acute distress.    Appearance: She is well-developed.  HENT:      Head: Normocephalic and atraumatic.     Mouth/Throat:     Mouth: Mucous membranes are moist.  Eyes:     Conjunctiva/sclera: Conjunctivae normal.  Cardiovascular:     Rate and Rhythm: Normal rate and regular rhythm.     Heart sounds: No murmur heard.   Pulmonary:     Effort: Pulmonary effort is normal. No respiratory distress.     Breath sounds: Normal breath sounds.  Abdominal:     General: Abdomen is flat.     Palpations: Abdomen is soft.     Tenderness: There is no abdominal tenderness.  Musculoskeletal:        General: Normal range of motion.     Cervical back: Neck supple.  Skin:    General: Skin is warm and dry.  Neurological:     General: No focal deficit present.     Mental Status: She is alert.  Psychiatric:        Mood and Affect: Mood normal.     ED Results / Procedures / Treatments   Labs (all labs ordered are listed, but only abnormal results are displayed) Labs Reviewed  CBC WITH DIFFERENTIAL/PLATELET - Abnormal; Notable for the following components:      Result Value   Hemoglobin 10.3 (*)    HCT 34.8 (*)    MCH 23.9 (*)    MCHC 29.6 (*)    RDW 18.6 (*)    All other components within normal limits  COMPREHENSIVE METABOLIC PANEL - Abnormal; Notable for the following components:   Potassium 3.4 (*)    Calcium 8.8 (*)    Total Protein 9.0 (*)    Albumin 3.2 (*)    AST 12 (*)    All other components within normal limits  SARS CORONAVIRUS 2 BY RT PCR (HOSPITAL ORDER, PERFORMED IN [facility] LAB)    EKG None  Radiology DG Chest Portable 1 View  Result Date: 01/29/2020 CLINICAL DATA:  Cough EXAM: PORTABLE CHEST 1 VIEW COMPARISON:  07/14/2019 FINDINGS: Lingular atelectasis or infiltrate. Right lung clear. Low lung volumes. Heart is normal size. No effusions or acute bony abnormality. IMPRESSION: Lingular atelectasis or infiltrate. Electronically Signed   By: 07/16/2019 M.D.   On: 01/29/2020 10:07    Procedures Procedures (including  critical care time)  Medications Ordered in ED Medications  cefTRIAXone (ROCEPHIN) injection 1 g (1 g Intramuscular Given 01/29/20 1158)  azithromycin (ZITHROMAX) tablet 500 mg (500 mg Oral Given 01/29/20 1158)  lidocaine (PF) (XYLOCAINE) 1 % injection (2 mLs  Given 01/29/20 1158)  ED Course  I have reviewed the triage vital signs and the nursing notes.  Pertinent labs & imaging results that were available during my care of the patient were reviewed by me and considered in my medical decision making (see chart for details).    MDM Rules/Calculators/A&P                          MDM: Covid negativechest xray shows atelatasis vs infiltrate.  Pt given rx for zithromax.  Pt given rocephin and zithromax here  Final Clinical Impression(s) / ED Diagnoses Final diagnoses:  Community acquired pneumonia, unspecified laterality  Arthralgia, unspecified joint   An After Visit Summary was printed and given to the patient.  Rx / DC Orders ED Discharge Orders         Ordered    meloxicam (MOBIC) 15 MG tablet  Daily        01/29/20 1137    azithromycin (ZITHROMAX Z-PAK) 250 MG tablet        01/29/20 1137        An After Visit Summary was printed and given to the patient.    Elson Areas, PA-C 01/29/20 1500    Bethann Berkshire, MD 01/30/20 1253

## 2020-03-28 ENCOUNTER — Other Ambulatory Visit: Payer: Self-pay | Admitting: Family

## 2020-03-28 ENCOUNTER — Other Ambulatory Visit (HOSPITAL_COMMUNITY): Payer: Self-pay | Admitting: Family

## 2020-03-28 DIAGNOSIS — E8809 Other disorders of plasma-protein metabolism, not elsewhere classified: Secondary | ICD-10-CM

## 2020-04-13 ENCOUNTER — Emergency Department (HOSPITAL_COMMUNITY): Payer: BLUE CROSS/BLUE SHIELD

## 2020-04-13 ENCOUNTER — Encounter (HOSPITAL_COMMUNITY): Payer: Self-pay

## 2020-04-13 ENCOUNTER — Emergency Department (HOSPITAL_COMMUNITY)
Admission: EM | Admit: 2020-04-13 | Discharge: 2020-04-13 | Disposition: A | Discharge status: Home / Self Care | Payer: BLUE CROSS/BLUE SHIELD | Attending: Emergency Medicine | Admitting: Emergency Medicine

## 2020-04-13 ENCOUNTER — Other Ambulatory Visit: Payer: Self-pay

## 2020-04-13 DIAGNOSIS — Z7901 Long term (current) use of anticoagulants: Secondary | ICD-10-CM | POA: Diagnosis not present

## 2020-04-13 DIAGNOSIS — Z79899 Other long term (current) drug therapy: Secondary | ICD-10-CM | POA: Insufficient documentation

## 2020-04-13 DIAGNOSIS — R531 Weakness: Secondary | ICD-10-CM | POA: Diagnosis present

## 2020-04-13 DIAGNOSIS — D649 Anemia, unspecified: Secondary | ICD-10-CM | POA: Insufficient documentation

## 2020-04-13 DIAGNOSIS — I10 Essential (primary) hypertension: Secondary | ICD-10-CM | POA: Diagnosis not present

## 2020-04-13 DIAGNOSIS — M1711 Unilateral primary osteoarthritis, right knee: Secondary | ICD-10-CM | POA: Diagnosis not present

## 2020-04-13 DIAGNOSIS — E876 Hypokalemia: Secondary | ICD-10-CM | POA: Insufficient documentation

## 2020-04-13 LAB — CBC
HCT: 32.3 % — ABNORMAL LOW (ref 36.0–46.0)
Hemoglobin: 9.6 g/dL — ABNORMAL LOW (ref 12.0–15.0)
MCH: 23.7 pg — ABNORMAL LOW (ref 26.0–34.0)
MCHC: 29.7 g/dL — ABNORMAL LOW (ref 30.0–36.0)
MCV: 79.8 fL — ABNORMAL LOW (ref 80.0–100.0)
Platelets: 316 10*3/uL (ref 150–400)
RBC: 4.05 MIL/uL (ref 3.87–5.11)
RDW: 18.8 % — ABNORMAL HIGH (ref 11.5–15.5)
WBC: 5.1 10*3/uL (ref 4.0–10.5)
nRBC: 0 % (ref 0.0–0.2)

## 2020-04-13 LAB — BASIC METABOLIC PANEL
Anion gap: 8 (ref 5–15)
BUN: 16 mg/dL (ref 6–20)
CO2: 27 mmol/L (ref 22–32)
Calcium: 9.1 mg/dL (ref 8.9–10.3)
Chloride: 99 mmol/L (ref 98–111)
Creatinine, Ser: 0.83 mg/dL (ref 0.44–1.00)
GFR, Estimated: >60 mL/min (ref >60)
Glucose, Bld: 81 mg/dL (ref 70–99)
Potassium: 2.8 mmol/L — ABNORMAL LOW (ref 3.5–5.1)
Sodium: 134 mmol/L — ABNORMAL LOW (ref 135–145)

## 2020-04-13 MED ORDER — POTASSIUM CHLORIDE CRYS ER 20 MEQ PO TBCR: 20.0000 meq | EXTENDED_RELEASE_TABLET | Freq: Every day | ORAL | 0 refills | Status: DC

## 2020-04-13 MED ORDER — MELOXICAM 7.5 MG PO TABS: 7.5000 mg | ORAL_TABLET | Freq: Every day | ORAL | 0 refills | Status: AC

## 2020-04-13 MED ORDER — POTASSIUM CHLORIDE CRYS ER 20 MEQ PO TBCR
40.0000 meq | EXTENDED_RELEASE_TABLET | Freq: Once | ORAL | Status: AC
  Administered 2020-04-13: 40 meq via ORAL
  Filled 2020-04-13: qty 2

## 2020-04-13 NOTE — ED Triage Notes (Signed)
Pt to er via ems, per ems pt had some leg weakness and couldn't get up off the toilet.  Pt states that she has been having some weakness for the past 6 months, and leg pain.

## 2020-04-13 NOTE — ED Notes (Signed)
Brought immediately back by triage   Circled the NS, and placed in hall   Pt complaint of leg pain and weakeness for the last 6 months   Today her leg hurt and she could not get off of the toilet and called EMS for transport for eval

## 2020-04-13 NOTE — ED Notes (Signed)
To Rad 

## 2020-04-13 NOTE — ED Notes (Signed)
Dr Miller in to assess 

## 2020-04-13 NOTE — ED Notes (Signed)
Pt daughter when DC instructions given asks to speak w EDP  Dr Hyacinth Meeker in to discuss findings and answer all questions   Pt then wheeled out to car for discharge

## 2020-04-13 NOTE — ED Provider Notes (Signed)
[facility] EMERGENCY DEPARTMENT Provider Note   CSN: [id number] Arrival date & time: 04/13/20  1631     History Chief Complaint  Patient presents with  . Weakness    Emma Morris is a 59 y.o. female.  HPI   This patient is a 59 year old female, history of hypertension taking daily medications including famotidine, meloxicam, hydrochlorothiazide, Lasix, Norvasc and albuterol.  She is morbidly obese, she states that she has had pain in her leg for about 6 months, this pain seems to be constant throughout the day, gradually worsening over time and had noted that she was having some difficulty getting up off of the commode today.  She denies any focal injuries to the leg, states that the pain seems to be deep, it hurts more in the knee and the hip, she feels like her legs are swollen chronically.  She does endorse taking her medications like she should, she has not had x-rays of her hips or knees in the past.  She has been given meloxicam for presumed arthritis and she has never had a blood clot.  She denies any chest pain shortness of breath dysuria diarrhea nausea or vomiting.  To be clear when asked about weakness the patient states that she had trouble getting off the toilet because of the pain in her knee not because she was feeling weak.  She also endorses having a recent diagnosis of some anemia, she is not sure where the blood is going, she states it is not gastrointestinal, hemoglobin was around 9, want to recheck  Past Medical History:  Diagnosis Date  . Hypertension     Patient Active Problem List   Diagnosis Date Noted  . Encounter for screening colonoscopy 09/27/2019  . Menometrorrhagia 05/03/2013  . Vaginal bleeding 05/02/2013  . Anemia due to chronic blood loss 05/02/2013    Past Surgical History:  Procedure Laterality Date  . DILITATION & CURRETTAGE/HYSTROSCOPY WITH THERMACHOICE ABLATION N/A 05/04/2013   Procedure: DILATATION & CURETTAGE/HYSTEROSCOPY WITH  THERMACHOICE ABLATION;  Surgeon: Lazaro Arms, MD;  Location: [facility];  Service: Gynecology;  Laterality: N/A;  temperature:87 degrees; total therapy time:14 minutes and 5 seconds     OB History    Gravida  4   Para  4   Term  3   Preterm  1   AB      Living  4     SAB      TAB      Ectopic      Multiple      Live Births              Family History  Problem Relation Age of Onset  . Cancer Mother   . Kidney disease Father   . Diabetes Father     Social History   Tobacco Use  . Smoking status: Never Smoker  . Smokeless tobacco: Never Used  Substance Use Topics  . Alcohol use: No  . Drug use: No    Home Medications Prior to Admission medications   Medication Sig Start Date End Date Taking? Authorizing Provider  acetaminophen (TYLENOL) 500 MG tablet Take 500 mg by mouth as needed.   Yes [provider]  albuterol (PROVENTIL HFA;VENTOLIN HFA) 108 (90 Base) MCG/ACT inhaler Inhale 2 puffs into the lungs every 6 (six) hours as needed for wheezing or shortness of breath.   Yes [provider]  amLODipine (NORVASC) 10 MG tablet Take 10 mg by mouth daily. 05/17/19  Yes [provider]  hydrochlorothiazide (HYDRODIURIL) 25 MG tablet Take 25 mg by mouth daily. 05/17/19  Yes [provider]  albuterol (PROVENTIL HFA;VENTOLIN HFA) 108 (90 Base) MCG/ACT inhaler Inhale 1-2 puffs into the lungs every 6 (six) hours as needed for wheezing or shortness of breath. Patient not taking: Reported on 07/14/2019 03/21/16   Triplett, Tammy, PA-C  furosemide (LASIX) 20 MG tablet Take 1 tablet (20 mg total) by mouth daily for 7 days. Patient not taking: Reported on 09/27/2019 07/14/19 07/21/19  Eber Hong, MD  meloxicam (MOBIC) 7.5 MG tablet Take 1 tablet (7.5 mg total) by mouth daily. 04/13/20 05/13/20  Eber Hong, MD  potassium chloride SA (KLOR-CON) 20 MEQ tablet Take 1 tablet (20 mEq total) by mouth daily for 7 days. 04/13/20 04/20/20  Eber Hong, MD  famotidine (PEPCID) 20 MG tablet Take 1 tablet (20 mg total) by mouth 2 (two) times daily. Patient not taking: Reported on 07/14/2019 05/05/14 07/14/19  Janne Napoleon, NP    Allergies    Patient has no known allergies.  Review of Systems   Review of Systems  All other systems reviewed and are negative.   Physical Exam Updated Vital Signs BP 135/80 (BP Location: Right Arm)   Pulse 91   Temp 98 F (36.7 C) (Oral)   Resp 18   Ht 1.575 m (5\' 2" )   Wt 119.7 kg   SpO2 100%   BMI 48.29 kg/m   Physical Exam Vitals and nursing note reviewed.  Constitutional:      General: She is not in acute distress.    Appearance: She is well-developed.  HENT:     Head: Normocephalic and atraumatic.     Mouth/Throat:     Pharynx: No oropharyngeal exudate.  Eyes:     General: No scleral icterus.       Right eye: No discharge.        Left eye: No discharge.     Conjunctiva/sclera: Conjunctivae normal.     Pupils: Pupils are equal, round, and reactive to light.  Neck:     Thyroid: No thyromegaly.     Vascular: No JVD.  Cardiovascular:     Rate and Rhythm: Normal rate and regular rhythm.     Heart sounds: Normal heart sounds. No murmur heard.  No friction rub. No gallop.   Pulmonary:     Effort: Pulmonary effort is normal. No respiratory distress.     Breath sounds: Normal breath sounds. No wheezing or rales.  Abdominal:     General: Bowel sounds are normal. There is no distension.     Palpations: Abdomen is soft. There is no mass.     Tenderness: There is no abdominal tenderness.  Musculoskeletal:        General: Swelling and tenderness present.     Cervical back: Normal range of motion and neck supple.     Right lower leg: Edema present.     Left lower leg: Edema present.     Comments: Minimal bilateral scant pitting edema at the ankles, the patient is morbidly obese but able to straight leg raise bilaterally.  She has pain in the right hip and the right knee when trying  straight leg raise but there is no deformities, there is no change in length of the legs, she can passively range both her hips and her knees with minimal pain.  There is no redness overlying the legs, they appear symmetrical and the compartments are very soft without any tenderness  to palpation  Lymphadenopathy:     Cervical: No cervical adenopathy.  Skin:    General: Skin is warm and dry.     Findings: No erythema or rash.  Neurological:     Mental Status: She is alert.     Coordination: Coordination normal.     Comments: Awake alert and follows commands  Psychiatric:        Behavior: Behavior normal.     ED Results / Procedures / Treatments   Labs (all labs ordered are listed, but only abnormal results are displayed) Labs Reviewed  BASIC METABOLIC PANEL - Abnormal; Notable for the following components:      Result Value   Sodium 134 (*)    Potassium 2.8 (*)    All other components within normal limits  CBC - Abnormal; Notable for the following components:   Hemoglobin 9.6 (*)    HCT 32.3 (*)    MCV 79.8 (*)    MCH 23.7 (*)    MCHC 29.7 (*)    RDW 18.8 (*)    All other components within normal limits    EKG EKG Interpretation  Date/Time:  Sunday April 13 2020 17:55:01 EST Ventricular Rate:  82 PR Interval:    QRS Duration: 90 QT Interval:  373 QTC Calculation: 436 R Axis:   14 Text Interpretation: Sinus rhythm since 2/21, no changes seen Confirmed by Eber Hong ([id number]) on 04/13/2020 6:08:32 PM   Radiology DG Knee Complete 4 Views Right  Result Date: 04/13/2020 CLINICAL DATA:  Right knee pain and weakness for 6 months EXAM: RIGHT KNEE - COMPLETE 4+ VIEW COMPARISON:  None. FINDINGS: No acute fracture. No dislocation. Tricompartmental osteoarthritis of the right knee which is severe in the medial compartment as manifested by complete joint space loss, subchondral sclerosis, and marginal osteophyte formation. No focal soft tissue abnormality. IMPRESSION: Severe  medial compartment osteoarthritis of the right knee. No acute findings. Electronically Signed   By: Duanne Guess D.O.   On: 04/13/2020 18:57   DG Hip Unilat W or Wo Pelvis 2-3 Views Right  Result Date: 04/13/2020 CLINICAL DATA:  Right hip pain and weakness for 6 months EXAM: DG HIP (WITH OR WITHOUT PELVIS) 2-3V RIGHT COMPARISON:  None. FINDINGS: There is no evidence of hip fracture or dislocation. There is no evidence of significant hip joint arthropathy or other focal bone abnormality. Facet arthrosis within the visualized lower lumbar spine. IMPRESSION: Negative. Electronically Signed   By: Duanne Guess D.O.   On: 04/13/2020 18:56    Procedures Procedures (including critical care time)  Medications Ordered in ED Medications  potassium chloride SA (KLOR-CON) CR tablet 40 mEq (40 mEq Oral Given 04/13/20 1933)    ED Course  I have reviewed the triage vital signs and the nursing notes.  Pertinent labs & imaging results that were available during my care of the patient were reviewed by me and considered in my medical decision making (see chart for details).  Clinical Course as of Apr 13 1954  Wynelle Link Apr 13, 2020  1952 X-rays of the hip and the knee shows severe tricompartmental arthritis of the knee as well as a rather unremarkable hip x-ray.  This explains why the patient is having most of her pain towards the knee.She is also mildly hypokalemic, she was given supplemental potassium and this will need to continue for the next week with a recheck.  Otherwise the patient appears well   [BM]    Clinical Course User Index [BM] Hyacinth Meeker,  Arlys JohnBrian, MD   MDM Rules/Calculators/A&P                          This patient has what appears to be worsening arthritis, I does not look like this is anything acute.  Her last CBC measured in the system in September on the 14th of 2021 showed a hemoglobin of 10.3.  She does have a slight pallor to her conjunctive a, will check a repeat CBC.  CBC shows  hemoglobin is 9.6, this is stable compared to what patient is reporting, potassium was repleted, imaging reviewed with the patient, stable for discharge  Final Clinical Impression(s) / ED Diagnoses Final diagnoses:  Hypokalemia  Arthritis of right knee    Rx / DC Orders ED Discharge Orders         Ordered    potassium chloride SA (KLOR-CON) 20 MEQ tablet  Daily        04/13/20 1953    meloxicam (MOBIC) 7.5 MG tablet  Daily        04/13/20 1955           Eber HongMiller, [place], MD 04/13/20 1955

## 2020-04-13 NOTE — Discharge Instructions (Signed)
Please see the local orthopedist, Dr. Romeo Apple to evaluate your knee for your severe arthritis.  You may benefit from an injection or consideration for knee replacement.  Unfortunately you do have low potassium as well, this can be treated with oral supplemental potassium, take once a day for the next 7 days and have your family doctor recheck your potassium level in 1 week.  If you should be worsened with weakness or pain return to the ER.  You may continue to take the meloxicam daily, I have sent a refill to your pharmacy

## 2020-04-13 NOTE — ED Notes (Signed)
Pt reports she is followed in [place]  Saw her PCP on Tuesday   "they're going to do all kinds of test"  Pt is morbidly obese   Doe not use an assist device to walk or assist w movement

## 2020-04-16 ENCOUNTER — Encounter: Payer: Self-pay | Admitting: Nurse Practitioner

## 2020-04-18 ENCOUNTER — Encounter: Payer: Self-pay | Admitting: Orthopedic Surgery

## 2020-04-18 ENCOUNTER — Ambulatory Visit (INDEPENDENT_AMBULATORY_CARE_PROVIDER_SITE_OTHER): Payer: BLUE CROSS/BLUE SHIELD | Admitting: Orthopedic Surgery

## 2020-04-18 ENCOUNTER — Other Ambulatory Visit: Payer: Self-pay

## 2020-04-18 VITALS — BP 160/102 | HR 102 | Ht 62.0 in | Wt 260.0 lb

## 2020-04-18 DIAGNOSIS — M170 Bilateral primary osteoarthritis of knee: Secondary | ICD-10-CM | POA: Diagnosis not present

## 2020-04-18 DIAGNOSIS — Z6841 Body Mass Index (BMI) 40.0 and over, adult: Secondary | ICD-10-CM | POA: Diagnosis not present

## 2020-04-18 DIAGNOSIS — M1711 Unilateral primary osteoarthritis, right knee: Secondary | ICD-10-CM

## 2020-04-18 NOTE — Patient Instructions (Signed)
In clinic today, you received an injection in one of your joints (sometimes more than one).  Occasionally, you can have some pain at the injection site, this is normal.  You can place ice at the injection site, or take over-the-counter medications such as Tylenol (acetaminophen) or Advil (ibuprofen).  Please follow all directions listed on the bottle.  If your joint (knee or shoulder) becomes swollen, red or very painful, please contact the clinic for additional assistance.   Two medications were injected, including lidocaine and a steroid (often referred to as cortisone).  Lidocaine is effective almost immediately but wears off quickly.  However, the steroid can take a few days to improve your symptoms.  In some cases, it can make your pain worse for a couple of days.  Do not be concerned if this happens as it is common.  You can apply ice or take some over-the-counter medications as needed.

## 2020-04-18 NOTE — Progress Notes (Addendum)
New Patient Visit  Assessment: Emma Morris is a 59 y.o. female with the following: Bilateral knee arthritis  Plan: Emma Morris has arthritis in bilateral knees.  We reviewed the right knee radiographs in clinic today, and I outlined the natural progression.  We had an extensive discussion regarding all potential treatment options, including continuing with the current treatment. NSAIDs are the most appropriate medications, and these are available OTC or via prescription.  I have urged them to remain active, and they can continue with activities on their own, or we can refer them to physical therapy.  We can also consider a brace, or compression sleeve. If the pain is severe enough, we can consider a steroid injection.  If their knee pain is affecting their everyday activities, including sleep, knee replacement is a consideration, but we would have to refer them to see my partner Dr. Romeo Apple.  However, her BMI is currently 47+, and she is not a good candidate for surgery.  We discussed the importance of weight loss and the significant risks associated with TKA in obese patients.   After discussing all of these options, the patient has elected to proceed with bilateral steroid injections.   Procedure note injection Right knee joint   Verbal consent was obtained to inject the right knee joint  Timeout was completed to confirm the site of injection.  The skin was prepped with alcohol and ethyl chloride was sprayed at the injection site.  A 21-gauge needle was used to inject 40 mg of Depo-Medrol and 1% lidocaine (3 cc) into the right knee using an anterolateral approach.  There were no complications. A sterile bandage was applied.   Procedure note injection Left knee joint   Verbal consent was obtained to inject the left knee joint  Timeout was completed to confirm the site of injection.  The skin was prepped with alcohol and ethyl chloride was sprayed at the injection site.  A  21-gauge needle was used to inject 40 mg of Depo-Medrol and 1% lidocaine (3 cc) into the left knee using an anterolateral approach.  There were no complications. A sterile bandage was applied.    The patient meets the AMA guidelines for Morbid obesity with BMI > 40.  The patient has been counseled on weight loss.     Follow-up: Return if symptoms worsen or fail to improve.  Subjective:  Chief Complaint  Patient presents with  . Knee Pain    bilateral knee pain, worse in last few week no injury or fall.     History of Present Illness: Emma Morris is a 59 y.o. female who presents for evaluation of bilateral knee pain.  Her knees have been painful for years.  It is progressively worsening, especially her right knee in recent weeks.  She presented to the ED within the last week.  Oral medications are not improving her pain.  Pain is primarily medial, but she has anterior knee pain also.  No injections.  She has not tried PT.   Review of Systems: No fevers or chills No numbness or tingling No chest pain No shortness of breath No bowel or bladder dysfunction No GI distress No headaches   Medical History:  Past Medical History:  Diagnosis Date  . Hypertension     Past Surgical History:  Procedure Laterality Date  . DILITATION & CURRETTAGE/HYSTROSCOPY WITH THERMACHOICE ABLATION N/A 05/04/2013   Procedure: DILATATION & CURETTAGE/HYSTEROSCOPY WITH THERMACHOICE ABLATION;  Surgeon: Lazaro Arms, MD;  Location: [facility];  Service: Gynecology;  Laterality: N/A;  temperature:87 degrees; total therapy time:14 minutes and 5 seconds    Family History  Problem Relation Age of Onset  . Cancer Mother   . Kidney disease Father   . Diabetes Father    Social History   Tobacco Use  . Smoking status: Never Smoker  . Smokeless tobacco: Never Used  Substance Use Topics  . Alcohol use: No  . Drug use: No    No Known Allergies  Current Meds  Medication Sig  . acetaminophen  (TYLENOL) 500 MG tablet Take 500 mg by mouth as needed.  Marland Kitchen albuterol (PROVENTIL HFA;VENTOLIN HFA) 108 (90 Base) MCG/ACT inhaler Inhale 2 puffs into the lungs every 6 (six) hours as needed for wheezing or shortness of breath.  Marland Kitchen amLODipine (NORVASC) 10 MG tablet Take 10 mg by mouth daily.  . furosemide (LASIX) 20 MG tablet Take 1 tablet (20 mg total) by mouth daily for 7 days.  . hydrochlorothiazide (HYDRODIURIL) 25 MG tablet Take 25 mg by mouth daily.  . meloxicam (MOBIC) 7.5 MG tablet Take 1 tablet (7.5 mg total) by mouth daily.  . potassium chloride SA (KLOR-CON) 20 MEQ tablet Take 1 tablet (20 mEq total) by mouth daily for 7 days.  . [DISCONTINUED] famotidine (PEPCID) 20 MG tablet Take 1 tablet (20 mg total) by mouth 2 (two) times daily.    Objective: BP (!) 160/102   Pulse (!) 102   Ht 5\' 2"  (1.575 m)   Wt 260 lb (117.9 kg)   BMI 47.55 kg/m   Physical Exam:  General:   Obese female.  Alert and oriented, no acute distress.  Gait:  Waddling gait, no assistive device.   Varus alignment bilateral.  Bilateral knees without effusion.  Bony landmarks difficult to localize.  Negative Lachman.  TTP medial joint line.  ROM from 5-110.  No laxity to varus to valgus stress.      IMAGING: I personally reviewed images previously obtained from the ED   XR of the right knee demonstrates varus alignment.  Complete loss of joint space with bone on bone contact within the medial compartment.  Subchondral sclerosis and osteophytes.      New Medications:  No orders of the defined types were placed in this encounter.     , MD  04/18/2020 10:42 PM

## 2020-04-23 ENCOUNTER — Ambulatory Visit (HOSPITAL_COMMUNITY): Payer: BLUE CROSS/BLUE SHIELD

## 2020-04-23 ENCOUNTER — Encounter (HOSPITAL_COMMUNITY): Payer: Self-pay

## 2020-05-01 ENCOUNTER — Ambulatory Visit (HOSPITAL_COMMUNITY)
Admission: RE | Admit: 2020-05-01 | Discharge: 2020-05-01 | Disposition: A | Discharge status: Home / Self Care | Payer: BLUE CROSS/BLUE SHIELD | Source: Ambulatory Visit | Attending: Family | Admitting: Family

## 2020-05-01 ENCOUNTER — Other Ambulatory Visit: Payer: Self-pay

## 2020-05-01 DIAGNOSIS — E8809 Other disorders of plasma-protein metabolism, not elsewhere classified: Secondary | ICD-10-CM | POA: Insufficient documentation

## 2020-05-29 ENCOUNTER — Other Ambulatory Visit: Payer: Self-pay

## 2020-05-29 ENCOUNTER — Encounter: Payer: Self-pay | Admitting: *Deleted

## 2020-05-29 ENCOUNTER — Encounter: Payer: Self-pay | Admitting: Nurse Practitioner

## 2020-05-29 ENCOUNTER — Ambulatory Visit (INDEPENDENT_AMBULATORY_CARE_PROVIDER_SITE_OTHER): Payer: BLUE CROSS/BLUE SHIELD | Admitting: Nurse Practitioner

## 2020-05-29 VITALS — BP 155/100 | HR 116 | Temp 97.5°F | Ht 62.0 in | Wt 263.2 lb

## 2020-05-29 DIAGNOSIS — D500 Iron deficiency anemia secondary to blood loss (chronic): Secondary | ICD-10-CM

## 2020-05-29 NOTE — Progress Notes (Signed)
Primary Care Physician:  Abran Richard, MD Primary Gastroenterologist:  Dr. Abbey Chatters  Chief Complaint  Patient presents with  . Anemia    HPI:   Emma Morris is a 60 y.o. female who presents on referral from primary care for anemia.  Reviewed information provided with referral including office visit dated 04/08/2020.  Per her visit, noted history of microcytic anemia.  Requesting referral to GI for endoscopic evaluation.  It appears iron, TIBC, ferritin were ordered.  However, we did not receive the lab results.  Also noted patient has never had a colonoscopy.  Most recent CBC in our system dated 04/13/2020 which found hemoglobin 9.6.  Over the past year Baseline appears to be 9.4-10.3.  Noted microcytic and hypochromic.  No recent iron studies found.  No history of colonoscopy or endoscopy in our system.  Today she states she is doing okay overall. Denies any previous colonoscopy or EGD. Denies hematochezia. Stools are dark. Occasionally takes Pepto, but cannot remember if dark stools are only with pepto. Dark stools are intermittent. Denies GERD symptoms. Denies abdominal pain, N/V, fever, chills, unintentional weight loss. Energy level "isn't that good." occasional dyspnea, improves with rest. Denies chest pain or syncope. Denies URI or flu-like symptoms. Denies loss of sense of taste or smell. The patient has received COVID-19 vaccination(s). Denies any other upper or lower GI symptoms.  She notes she did have a blood transfusion about 4 years ago. Her BP is elevated but forgot to take her BP medication today.  Past Medical History:  Diagnosis Date  . Hypertension   . Knee pain   . Obesity     Past Surgical History:  Procedure Laterality Date  . CESAREAN SECTION    . DILITATION & CURRETTAGE/HYSTROSCOPY WITH THERMACHOICE ABLATION N/A 05/04/2013   Procedure: DILATATION & CURETTAGE/HYSTEROSCOPY WITH THERMACHOICE ABLATION;  Surgeon: Florian Buff, MD;  Location: [facility];   Service: Gynecology;  Laterality: N/A;  temperature:87 degrees; total therapy time:14 minutes and 5 seconds    Current Outpatient Medications  Medication Sig Dispense Refill  . acetaminophen (TYLENOL) 500 MG tablet Take 500 mg by mouth as needed.    Marland Kitchen albuterol (PROVENTIL HFA;VENTOLIN HFA) 108 (90 Base) MCG/ACT inhaler Inhale 2 puffs into the lungs every 6 (six) hours as needed for wheezing or shortness of breath.    Marland Kitchen amLODipine (NORVASC) 10 MG tablet Take 10 mg by mouth daily.    . furosemide (LASIX) 20 MG tablet Take 1 tablet (20 mg total) by mouth daily for 7 days. 7 tablet 0  . potassium chloride SA (KLOR-CON) 20 MEQ tablet Take 1 tablet (20 mEq total) by mouth daily for 7 days. (Patient not taking: Reported on 05/29/2020) 7 tablet 0   No current facility-administered medications for this visit.    Allergies as of 05/29/2020  . (No Known Allergies)    Family History  Problem Relation Age of Onset  . Cancer Mother   . Kidney disease Father   . Diabetes Father   . Colon cancer Neg Hx     Social History   Socioeconomic History  . Marital status: Married    Spouse name: Not on file  . Number of children: Not on file  . Years of education: Not on file  . Highest education level: Not on file  Occupational History  . Not on file  Tobacco Use  . Smoking status: Never Smoker  . Smokeless tobacco: Never Used  Substance and Sexual Activity  . Alcohol  use: No  . Drug use: No  . Sexual activity: Yes  Other Topics Concern  . Not on file  Social History Narrative  . Not on file   Social Determinants of Health   Financial Resource Strain: Not on file  Food Insecurity: Not on file  Transportation Needs: Not on file  Physical Activity: Not on file  Stress: Not on file  Social Connections: Not on file  Intimate Partner Violence: Not on file    Subjective: Review of Systems  Constitutional: Positive for malaise/fatigue. Negative for chills, fever and weight loss.  HENT:  Negative for congestion and sore throat.   Respiratory: Positive for shortness of breath (with exertion, improves at rest). Negative for cough.   Cardiovascular: Negative for chest pain and palpitations.  Gastrointestinal: Negative for abdominal pain, blood in stool, constipation, diarrhea, heartburn, melena, nausea and vomiting.  Musculoskeletal: Negative for joint pain and myalgias.  Skin: Negative for rash.  Neurological: Negative for dizziness and weakness.  Endo/Heme/Allergies: Does not bruise/bleed easily.  Psychiatric/Behavioral: Negative for depression. The patient is not nervous/anxious.   All other systems reviewed and are negative.      Objective: BP (!) 155/100   Pulse (!) 116   Temp (!) 97.5 F (36.4 C) (Temporal)   Ht _0  (1.575 m)   Wt 263 lb 3.2 oz (119.4 kg)   BMI 48.14 kg/m  Physical Exam Vitals and nursing note reviewed.  Constitutional:      General: She is not in acute distress.    Appearance: Normal appearance. She is well-developed. She is obese. She is not ill-appearing, toxic-appearing or diaphoretic.  HENT:     Head: Normocephalic and atraumatic.     Nose: No congestion or rhinorrhea.  Eyes:     General: No scleral icterus. Cardiovascular:     Rate and Rhythm: Normal rate and regular rhythm.     Heart sounds: Normal heart sounds.  Pulmonary:     Effort: Pulmonary effort is normal. No respiratory distress.     Breath sounds: Normal breath sounds.  Abdominal:     General: Bowel sounds are normal.     Palpations: Abdomen is soft. There is no hepatomegaly, splenomegaly or mass.     Tenderness: There is no abdominal tenderness. There is no guarding or rebound.     Hernia: No hernia is present.  Skin:    General: Skin is warm and dry.     Coloration: Skin is not jaundiced.     Findings: No rash.  Neurological:     General: No focal deficit present.     Mental Status: She is alert and oriented to person, place, and time.  Psychiatric:         Attention and Perception: Attention normal.        Mood and Affect: Mood normal.        Speech: Speech normal.        Behavior: Behavior normal.        Thought Content: Thought content normal.        Cognition and Memory: Cognition and memory normal.      Assessment:  Very pleasant 60 year old female presents on referral from primary care for microcytic anemia.  Apparently iron studies were checked by primary care and we have requested these results.  Hemoglobin levels in the 9 range as per HPI.  No obvious hematochezia.  She does have intermittent dark stools, but sometimes takes Pepto and is unable to decide if stools were dark  just when taking Pepto or if other times as well.  Query possible melena.  Denies any overt upper or lower GI symptoms.  She has never had a colonoscopy before.  At this point we will schedule her for a colonoscopy and upper endoscopy to evaluate for source of bleeding.  She may eventually need to Givens capsule pending the results of her EGD and colonoscopy.  Further recommendations will follow her procedure.  I am not going to check iron studies today anticipating that we will receive recent results from her primary care.  I also will not check a CBC as her most recent labs have been relatively stable over the past 10 months with most recent CBC approximately 1 month ago.   Proceed with TCS and EGD on propofol/MAC with Dr. Abbey Chatters on propofol/MAC in near future: the risks, benefits, and alternatives have been discussed with the patient in detail. The patient states understanding and desires to proceed.  ASA III   Plan: 1. Request iron studies from primary care 2. Colonoscopy and upper endoscopy as per above 3. Return for follow-up in 3 months    Thank you for allowing [place] to participate in the care of [place], DNP, AGNP-C Adult & Gerontological Nurse Practitioner [facility]   05/29/2020 3:45  PM   Disclaimer: This note was dictated with voice recognition software. Similar sounding words can inadvertently be transcribed and may not be corrected upon review.

## 2020-05-29 NOTE — Patient Instructions (Signed)
Your health issues we discussed today were:   Anemia (blood loss): 1. As we discussed, we will request your previous labs from your primary care doctor 2. We will schedule you for colonoscopy and upper endoscopy to evaluate for any sources of bleeding in your GI tract 3. Further recommendations will follow your procedures 4. Call us if you have any obvious bleeding or black stools  Overall I recommend:  1. Continue your other current medications 2. Return for follow-up in 3 months 3. Call [facility] questions or concerns.   ---------------------------------------------------------------  I am glad you have gotten your COVID-19 vaccination!  Even though you are fully vaccinated you should continue to follow CDC and state/local guidelines.  ---------------------------------------------------------------   At [facility] we value your feedback. You may receive a survey about your visit today. Please share your experience as we strive to create trusting relationships with our patients to provide genuine, compassionate, quality care.  We appreciate your understanding and patience as we review any laboratory studies, imaging, and other diagnostic tests that are ordered as we care for you. Our office policy is 5 business days for review of these results, and any emergent or urgent results are addressed in a timely manner for your best interest. If you do not hear from our office in 1 week, please contact us.   We also encourage the use of MyChart, which contains your medical information for your review as well. If you are not enrolled in this feature, an access code is on this after visit summary for your convenience. Thank you for allowing [place] to be involved in your care.  It was great to see you today!  I hope you have a great winter, stay safe and warm this weekend!!

## 2020-05-30 ENCOUNTER — Encounter: Payer: Self-pay | Admitting: *Deleted

## 2020-06-03 ENCOUNTER — Ambulatory Visit: Payer: BLUE CROSS/BLUE SHIELD | Admitting: General Surgery

## 2020-06-10 ENCOUNTER — Ambulatory Visit: Payer: BLUE CROSS/BLUE SHIELD | Admitting: General Surgery

## 2020-06-19 ENCOUNTER — Other Ambulatory Visit: Payer: Self-pay

## 2020-06-19 ENCOUNTER — Ambulatory Visit: Payer: BLUE CROSS/BLUE SHIELD | Admitting: General Surgery

## 2020-06-19 ENCOUNTER — Encounter: Payer: Self-pay | Admitting: General Surgery

## 2020-06-19 VITALS — BP 128/83 | HR 99 | Temp 98.3°F | Resp 16 | Ht 62.0 in | Wt 264.0 lb

## 2020-06-19 DIAGNOSIS — K8020 Calculus of gallbladder without cholecystitis without obstruction: Secondary | ICD-10-CM

## 2020-06-19 NOTE — Patient Instructions (Signed)
Cholelithiasis  Cholelithiasis happens when gallstones form in the gallbladder. The gallbladder stores bile. Bile is a fluid that helps digest fats. Bile can harden and form into gallstones. If they cause a blockage, they can cause pain (gallbladder attack). What are the causes? This condition may be caused by:  Some blood diseases, such as sickle cell anemia.  Too much of a fat-like substance (cholesterol) in your bile.  Not enough bile salts in your bile. These salts help the body absorb and digest fats.  The gallbladder not emptying fully or often enough. This is common in pregnant women. What increases the risk? The following factors may make you more likely to develop this condition:  Being female.  Being pregnant many times.  Eating a lot of fried foods, fat, and refined carbs (refined carbohydrates).  Being very overweight (obese).  Being older than age 40.  Using medicines with female hormones in them for a long time.  Losing weight fast.  Having gallstones in your family.  Having some health problems, such as diabetes, Crohn's disease, or liver disease. What are the signs or symptoms? Often, there may be gallstones but no symptoms. These gallstones are called silent gallstones. If a gallstone causes a blockage, you may get sudden pain. The pain:  Can be in the upper right part of your belly (abdomen).  Normally comes at night or after you eat.  Can last an hour or more.  Can spread to your right shoulder, back, or chest.  Can feel like discomfort, burning, or fullness in the upper part of your belly (indigestion). If the blockage lasts more than a few hours, you can get an infection or swelling. You may:  Feel like you may vomit.  Vomit.  Feel bloated.  Have belly pain for 5 hours or more.  Feel tender in your belly, often in the upper right part and under your ribs.  Have fever or chills.  Have skin or the white parts of your eyes turn yellow  (jaundice).  Have dark pee (urine) or pale poop (stool). How is this treated? Treatment for this condition depends on how bad you feel. If you have symptoms, you may need:  Home care, if symptoms are not very bad. ? Do not eat for 12-24 hours. Drink only water and clear liquids. ? Start to eat simple or clear foods after 1 or 2 days. Try broths and crackers. ? You may need medicines for pain or stomach upset or both. ? If you have an infection, you will need antibiotics.  A hospital stay, if you have very bad pain or a very bad infection.  Surgery to remove your gallbladder. You may need this if: ? Gallstones keep coming back. ? You have very bad symptoms.  Medicines to break up gallstones. Medicines: ? Are best for small gallstones. ? May be used for up to 6-12 months.  A procedure to find and take out gallstones or to break up gallstones. Follow these instructions at home: Medicines  Take over-the-counter and prescription medicines only as told by your doctor.  If you were prescribed an antibiotic medicine, take it as told by your doctor. Do not stop taking the antibiotic even if you start to feel better.  Ask your doctor if the medicine prescribed to you requires you to avoid driving or using machinery. Eating and drinking  Drink enough fluid to keep your urine pale yellow. Drink water or clear fluids. This is important when you have pain.  Eat   healthy foods. Choose: ? Fewer fatty foods, such as fried foods. ? Fewer refined carbs. Avoid breads and grains that are highly processed, such as white bread and white rice. Choose whole grains, such as whole-wheat bread and brown rice. ? More fiber. Almonds, fresh fruit, and beans are healthy sources. General instructions  Keep a healthy weight.  Keep all follow-up visits as told by your doctor. This is important. Where to find more information  National Institute of Diabetes and Digestive and Kidney Diseases:  www.niddk.nih.gov Contact a doctor if:  You have sudden pain in the upper right part of your belly. Pain might spread to your right shoulder, back, or chest.  You have been diagnosed with gallstones that have no symptoms and you get: ? Belly pain. ? Discomfort, burning, or fullness in the upper part of your abdomen.  You have dark urine or pale stools. Get help right away if:  You have sudden pain in the upper right part of your abdomen, and the pain lasts more than 2 hours.  You have pain in your abdomen, and: ? It lasts more than 5 hours. ? It keeps getting worse.  You have a fever or chills.  You keep feeling like you may vomit.  You keep vomiting.  Your skin or the white parts of your eyes turn yellow. Summary  Cholelithiasis happens when gallstones form in the gallbladder.  This condition may be caused by a blood disease, too much of a fat-like substance in the bile, or not enough bile salts in bile.  Treatment for this condition depends on how bad you feel.  If you have symptoms, do not eat or drink. You may need medicines. You may need a hospital stay for very bad pain or a very bad infection.  You may need surgery if gallstones keep coming back or if you have very bad symptoms. This information is not intended to replace advice given to you by your health care provider. Make sure you discuss any questions you have with your health care provider. Document Revised: 06/22/2019 Document Reviewed: 03/26/2019 Elsevier Patient Education  2021 [employer]  

## 2020-06-19 NOTE — Progress Notes (Signed)
Emma Morris; [id number]; 13-Apr-1961   HPI Patient is a 60 year old black female who was referred to my care by Dr. Durene Cal for evaluation and treatment of cholelithiasis.  This was found on ultrasound of the right upper quadrant in December 2021.  Patient states she was having some right flank pain.  She states the right flank, back pain is made worse with movement.  She denies any nausea, vomiting, fatty food intolerance, fever, chills, jaundice.  She has been seen by gastroenterology and is to have a colonoscopy later this month.  This is being done for anemia.  She denies any right upper quadrant abdominal pain or bloating.   Past Medical History:  Diagnosis Date  . Hypertension   . Knee pain   . Obesity     Past Surgical History:  Procedure Laterality Date  . CESAREAN SECTION    . DILITATION & CURRETTAGE/HYSTROSCOPY WITH THERMACHOICE ABLATION N/A 05/04/2013   Procedure: DILATATION & CURETTAGE/HYSTEROSCOPY WITH THERMACHOICE ABLATION;  Surgeon: Lazaro Arms, MD;  Location: [facility];  Service: Gynecology;  Laterality: N/A;  temperature:87 degrees; total therapy time:14 minutes and 5 seconds    Family History  Problem Relation Age of Onset  . Cancer Mother   . Kidney disease Father   . Diabetes Father   . Colon cancer Neg Hx     Current Outpatient Medications on File Prior to Visit  Medication Sig Dispense Refill  . acetaminophen (TYLENOL) 500 MG tablet Take 500 mg by mouth as needed.    Marland Kitchen albuterol (PROVENTIL HFA;VENTOLIN HFA) 108 (90 Base) MCG/ACT inhaler Inhale 2 puffs into the lungs every 6 (six) hours as needed for wheezing or shortness of breath.    Marland Kitchen amLODipine (NORVASC) 10 MG tablet Take 10 mg by mouth daily.    . hydrochlorothiazide (HYDRODIURIL) 25 MG tablet 1 tablet    . [DISCONTINUED] famotidine (PEPCID) 20 MG tablet Take 1 tablet (20 mg total) by mouth 2 (two) times daily. 30 tablet 0   No current facility-administered medications on file prior to visit.    No  Known Allergies  Social History   Substance and Sexual Activity  Alcohol Use No    Social History   Tobacco Use  Smoking Status Never Smoker  Smokeless Tobacco Never Used    Review of Systems  Constitutional: Negative.   HENT: Negative.   Eyes: Negative.   Respiratory: Negative.   Cardiovascular: Negative.   Gastrointestinal: Negative.   Genitourinary: Negative.   Musculoskeletal: Negative.   Skin: Negative.   Neurological: Negative.   Endo/Heme/Allergies: Negative.   Psychiatric/Behavioral: Negative.     Objective   Vitals:   06/19/20 1153  BP: 128/83  Pulse: 99  Resp: 16  Temp: 98.3 F (36.8 C)  SpO2: 95%    Physical Exam Vitals reviewed.  Constitutional:      Appearance: Normal appearance. She is obese. She is not ill-appearing.  HENT:     Head: Normocephalic and atraumatic.  Eyes:     General: No scleral icterus. Cardiovascular:     Rate and Rhythm: Normal rate and regular rhythm.     Heart sounds: Normal heart sounds. No murmur heard. No friction rub. No gallop.   Abdominal:     General: There is no distension.     Palpations: Abdomen is soft. There is no mass.     Tenderness: There is no abdominal tenderness. There is no guarding or rebound.     Hernia: No hernia is present.  Skin:  General: Skin is warm and dry.  Neurological:     Mental Status: She is alert and oriented to person, place, and time.     Gait: Gait abnormal.     Comments: Patient states she has difficulty walking due to knee issues.   Previous primary care notes reviewed.  Ultrasound report reviewed.  Assessment  Cholelithiasis, currently asymptomatic.  I suspect her right flank/back pain may be secondary to her difficulties walking secondary to her knees and her body habitus.  She denies any right upper quadrant abdominal pain. Plan   At this point, I do not feel laparoscopic cholecystectomy is indicated.  She understands and agrees.  Literature was given concerning  symptoms of biliary colic.  I strongly encouraged her to keep her appointment to have a colonoscopy.  Follow-up as needed.

## 2020-07-02 NOTE — Patient Instructions (Addendum)
Your procedure is scheduled on: 07/08/2020  Report to [facility] at     12:45 PM.  Call this number if you have problems the morning of surgery: [phone]   Remember:              Follow Directions on the letter you received from Your Physician's office regarding the Bowel Prep              No Smoking the day of Procedure :   Take these medicines the morning of surgery with A SIP OF WATER: Amlodipine   Do not wear jewelry, make-up or nail polish.    Do not bring valuables to the hospital.  Contacts, dentures or bridgework may not be worn into surgery.  .   Patients discharged the day of surgery will not be allowed to drive home.     Colonoscopy, Adult, Care After This sheet gives you information about how to care for yourself after your procedure. Your health care provider may also give you more specific instructions. If you have problems or questions, contact your health care provider. What can I expect after the procedure? After the procedure, it is common to have:  A small amount of blood in your stool for 24 hours after the procedure.  Some gas.  Mild abdominal cramping or bloating.  Follow these instructions at home: General instructions   For the first 24 hours after the procedure: ? Do not drive or use machinery. ? Do not sign important documents. ? Do not drink alcohol. ? Do your regular daily activities at a slower pace than normal. ? Eat soft, easy-to-digest foods. ? Rest often.  Take over-the-counter or prescription medicines only as told by your health care provider.  It is up to you to get the results of your procedure. Ask your health care provider, or the department performing the procedure, when your results will be ready. Relieving cramping and bloating  Try walking around when you have cramps or feel bloated.  Apply heat to your abdomen as told by your health care provider. Use a heat source that your health care provider recommends, such as a  moist heat pack or a heating pad. ? Place a towel between your skin and the heat source. ? Leave the heat on for 20-30 minutes. ? Remove the heat if your skin turns bright red. This is especially important if you are unable to feel pain, heat, or cold. You may have a greater risk of getting burned. Eating and drinking  Drink enough fluid to keep your urine clear or pale yellow.  Resume your normal diet as instructed by your health care provider. Avoid heavy or fried foods that are hard to digest.  Avoid drinking alcohol for as long as instructed by your health care provider. Contact a health care provider if:  You have blood in your stool 2-3 days after the procedure. Get help right away if:  You have more than a small spotting of blood in your stool.  You pass large blood clots in your stool.  Your abdomen is swollen.  You have nausea or vomiting.  You have a fever.  You have increasing abdominal pain that is not relieved with medicine. This information is not intended to replace advice given to you by your health care provider. Make sure you discuss any questions you have with your health care provider. Document Released: 12/16/2003 Document Revised: 01/26/2016 Document Reviewed: 07/15/2015 Elsevier Interactive Patient Education  [employer].  Upper Endoscopy, Adult, Care After This sheet gives you information about how to care for yourself after your procedure. Your health care provider may also give you more specific instructions. If you have problems or questions, contact your health care provider. What can I expect after the procedure? After the procedure, it is common to have:  A sore throat.  Mild stomach pain or discomfort.  Bloating.  Nausea. Follow these instructions at home:  Follow instructions from your health care provider about what to eat or drink after your procedure.  Return to your normal activities as told by your health care provider. Ask  your health care provider what activities are safe for you.  Take over-the-counter and prescription medicines only as told by your health care provider.  If you were given a sedative during the procedure, it can affect you for several hours. Do not drive or operate machinery until your health care provider says that it is safe.  Keep all follow-up visits as told by your health care provider. This is important.   Contact a health care provider if you have:  A sore throat that lasts longer than one day.  Trouble swallowing. Get help right away if:  You vomit blood or your vomit looks like coffee grounds.  You have: ? A fever. ? Bloody, black, or tarry stools. ? A severe sore throat or you cannot swallow. ? Difficulty breathing. ? Severe pain in your chest or abdomen. Summary  After the procedure, it is common to have a sore throat, mild stomach discomfort, bloating, and nausea.  If you were given a sedative during the procedure, it can affect you for several hours. Do not drive or operate machinery until your health care provider says that it is safe.  Follow instructions from your health care provider about what to eat or drink after your procedure.  Return to your normal activities as told by your health care provider. This information is not intended to replace advice given to you by your health care provider. Make sure you discuss any questions you have with your health care provider. Document Revised: 05/01/2019 Document Reviewed: 10/03/2017 Elsevier Patient Education  2021 [employer].

## 2020-07-04 ENCOUNTER — Encounter (HOSPITAL_COMMUNITY): Payer: Self-pay

## 2020-07-04 ENCOUNTER — Other Ambulatory Visit: Payer: Self-pay

## 2020-07-04 ENCOUNTER — Other Ambulatory Visit (HOSPITAL_COMMUNITY)
Admission: RE | Admit: 2020-07-04 | Discharge: 2020-07-04 | Disposition: A | Discharge status: Home / Self Care | Payer: BLUE CROSS/BLUE SHIELD | Source: Ambulatory Visit | Attending: Internal Medicine | Admitting: Internal Medicine

## 2020-07-04 ENCOUNTER — Encounter (HOSPITAL_COMMUNITY)
Admission: RE | Admit: 2020-07-04 | Discharge: 2020-07-04 | Disposition: A | Discharge status: Home / Self Care | Payer: BLUE CROSS/BLUE SHIELD | Source: Ambulatory Visit | Attending: Internal Medicine | Admitting: Internal Medicine

## 2020-07-04 DIAGNOSIS — Z01812 Encounter for preprocedural laboratory examination: Secondary | ICD-10-CM | POA: Insufficient documentation

## 2020-07-04 DIAGNOSIS — U071 COVID-19: Secondary | ICD-10-CM | POA: Insufficient documentation

## 2020-07-04 HISTORY — DX: Unspecified osteoarthritis, unspecified site: M19.90

## 2020-07-04 HISTORY — DX: Anemia, unspecified: D64.9

## 2020-07-04 LAB — BASIC METABOLIC PANEL
Anion gap: 7 (ref 5–15)
BUN: 14 mg/dL (ref 6–20)
CO2: 28 mmol/L (ref 22–32)
Calcium: 9 mg/dL (ref 8.9–10.3)
Chloride: 101 mmol/L (ref 98–111)
Creatinine, Ser: 0.71 mg/dL (ref 0.44–1.00)
GFR, Estimated: >60 mL/min (ref >60)
Glucose, Bld: 90 mg/dL (ref 70–99)
Potassium: 3 mmol/L — ABNORMAL LOW (ref 3.5–5.1)
Sodium: 136 mmol/L (ref 135–145)

## 2020-07-04 LAB — SARS CORONAVIRUS 2 (TAT 6-24 HRS): SARS Coronavirus 2: POSITIVE — AB

## 2020-07-05 ENCOUNTER — Telehealth: Payer: Self-pay | Admitting: Physician Assistant

## 2020-07-05 NOTE — Telephone Encounter (Signed)
Called to discuss with patient about Covid symptoms and the use of a monoclonal antibody infusion for those with mild to moderate Covid symptoms and at a high risk of hospitalization.   Pt is not qualified for the monoclonal antibody infusion as she is having asymptomatic infection. Preventative practices reviewed. Patient verbalized understanding.  [place], [place]  07/05/2020 12:29 PM

## 2020-07-07 ENCOUNTER — Telehealth: Payer: Self-pay | Admitting: *Deleted

## 2020-07-07 ENCOUNTER — Encounter: Payer: Self-pay | Admitting: *Deleted

## 2020-07-07 NOTE — Telephone Encounter (Signed)
Patient returned call. She is aware covid + test. She has been rescheduled to 3/29 am appt. Aware will not be retested for covid.

## 2020-07-07 NOTE — Telephone Encounter (Signed)
Received call from endo patient was covid + and need to r/s patient procedure  Called pt, no answer and VM is full

## 2020-08-01 ENCOUNTER — Encounter: Payer: Self-pay | Admitting: Orthopedic Surgery

## 2020-08-01 ENCOUNTER — Ambulatory Visit: Payer: 59

## 2020-08-01 ENCOUNTER — Ambulatory Visit (INDEPENDENT_AMBULATORY_CARE_PROVIDER_SITE_OTHER): Payer: 59 | Admitting: Orthopedic Surgery

## 2020-08-01 ENCOUNTER — Other Ambulatory Visit: Payer: Self-pay

## 2020-08-01 VITALS — BP 166/103 | HR 121 | Ht 62.0 in | Wt 267.4 lb

## 2020-08-01 DIAGNOSIS — Z6841 Body Mass Index (BMI) 40.0 and over, adult: Secondary | ICD-10-CM

## 2020-08-01 DIAGNOSIS — M25562 Pain in left knee: Secondary | ICD-10-CM

## 2020-08-01 DIAGNOSIS — M1711 Unilateral primary osteoarthritis, right knee: Secondary | ICD-10-CM

## 2020-08-01 DIAGNOSIS — M1712 Unilateral primary osteoarthritis, left knee: Secondary | ICD-10-CM | POA: Diagnosis not present

## 2020-08-01 DIAGNOSIS — G8929 Other chronic pain: Secondary | ICD-10-CM

## 2020-08-01 NOTE — Patient Instructions (Signed)

## 2020-08-01 NOTE — Progress Notes (Signed)
Orthopaedic Clinic Return  Assessment: Emma Morris is a 60 y.o. female with the following: Bilateral knee arthritis  Plan: Patient had good relief of her symptoms following previous injections.  She is interested in bilateral knee injections.  We briefly discussed total knee replacement, but at her current BMI, she is at an increased risk of complications.  In order to be considered for knee replacement, she would need to lose ~50 lbs to get to a BMI of 40.  This was discussed in clinic, and she stated her understanding.  Follow up as needed.   Procedure note injection - Bilateral  Knee joint   Verbal consent was obtained to inject the Bilateral  Knee joint  Timeout was completed to confirm the site of injection.  The skin was prepped with alcohol and ethyl chloride was sprayed at the injection site.  A 21-gauge needle was used to inject 6 mg of Betamethasone and 1% lidocaine (3 cc) into the Bilateral  Knee using an Anterolateral approach.  There were no complications. A sterile bandage was applied.   Body mass index is 48.9 kg/m.   The patient meets the AMA guidelines for Morbid obesity with BMI > 40.  The patient has been counseled on weight loss.    Follow-up: Return if symptoms worsen or fail to improve.   Subjective:  Chief Complaint  Patient presents with  . Knee Pain    L/ both hurt for a month or more. Wants injections    History of Present Illness: Emma Morris is a 60 y.o. female who presents for repeat evaluation of her bilateral knees.  She was last seen in clinic about 3 months ago and both knees were injected.  She had improvement in her symptoms for approximately 2 months.  Over the past month or so, her pain has returned and is now severe.  She uses a walker to assist with ambulation.  She takes ibuprofen and tramadol for her pain.    Review of Systems: No fevers or chills No numbness or tingling No chest pain No shortness of breath No bowel or  bladder dysfunction No GI distress No headaches  Objective: BP (!) 166/103   Pulse (!) 121   Ht 5\' 2"  (1.575 m)   Wt 267 lb 6 oz (121.3 kg)   BMI 48.90 kg/m   Physical Exam:  Obese female.  No acute distress.  Alert and oriented.   Bilateral knees, diffuse anterior pain.  Tenderness along medial and lateral joint line.  Crepitus with ROM.  Varus alignment.  No increased laxity to varus or valgus stress.   IMAGING: I personally ordered and reviewed the following images:  XR of the left knee demonstrates varus alignment overall.  Complete loss of joint space medially with subchondral sclerosis and osteophytes.  Osteophytes within patellofemoral compartment.   Impression: severe left knee arthritis.   , MD 08/01/2020 11:37 AM

## 2020-08-07 ENCOUNTER — Encounter (HOSPITAL_COMMUNITY)
Admission: RE | Admit: 2020-08-07 | Discharge: 2020-08-07 | Disposition: A | Discharge status: Home / Self Care | Payer: 59 | Source: Ambulatory Visit | Attending: Internal Medicine | Admitting: Internal Medicine

## 2020-08-07 ENCOUNTER — Other Ambulatory Visit: Payer: Self-pay

## 2020-08-08 ENCOUNTER — Telehealth: Payer: Self-pay | Admitting: *Deleted

## 2020-08-08 NOTE — Telephone Encounter (Signed)
Called pt, phone goes straight to VM, VM full

## 2020-08-08 NOTE — Telephone Encounter (Signed)
-----   Message from Jethro Bolus, RN sent at 08/08/2020  8:42 AM EDT ----- Good Morning!  We have attempted several times to get in touch with Ms Eddleman about her Colonoscopy/EGD.  .  We have been unable to do so.

## 2020-08-11 ENCOUNTER — Encounter: Payer: Self-pay | Admitting: *Deleted

## 2020-08-11 ENCOUNTER — Encounter (HOSPITAL_COMMUNITY): Payer: Self-pay | Admitting: Anesthesiology

## 2020-08-11 MED ORDER — PEG 3350-KCL-NA BICARB-NACL 420 G PO SOLR: ORAL | 0 refills | Status: DC

## 2020-08-11 NOTE — Addendum Note (Signed)
Addended by: Armstead Peaks on: 08/11/2020 01:53 PM   Modules accepted: Orders

## 2020-08-11 NOTE — Telephone Encounter (Signed)
Called pt, goes straight to VM

## 2020-08-11 NOTE — Telephone Encounter (Signed)
Called pt. She states she needs to r/s procedure. She has been moved to 5/3 am appt. Aware will mail new instructions to her. Message sent to endo with appt change.

## 2020-08-28 ENCOUNTER — Ambulatory Visit: Payer: BLUE CROSS/BLUE SHIELD | Admitting: Nurse Practitioner

## 2020-09-09 NOTE — Patient Instructions (Signed)
Your procedure is scheduled on: 09/16/2020  Report to [facility] at 10:30    AM.  Call this number if you have problems the morning of surgery: [phone]   Remember:              Follow Directions on the letter you received from Your Physician's office regarding the Bowel Prep              No Smoking the day of Procedure :   Take these medicines the morning of surgery with A SIP OF WATER: Amlodipine    Use inhalers if needed   Do not wear jewelry, make-up or nail polish.    Do not bring valuables to the hospital.  Contacts, dentures or bridgework may not be worn into surgery.  .   Patients discharged the day of surgery will not be allowed to drive home.     Colonoscopy, Adult, Care After This sheet gives you information about how to care for yourself after your procedure. Your health care provider may also give you more specific instructions. If you have problems or questions, contact your health care provider. What can I expect after the procedure? After the procedure, it is common to have:  A small amount of blood in your stool for 24 hours after the procedure.  Some gas.  Mild abdominal cramping or bloating.  Follow these instructions at home: General instructions   For the first 24 hours after the procedure: ? Do not drive or use machinery. ? Do not sign important documents. ? Do not drink alcohol. ? Do your regular daily activities at a slower pace than normal. ? Eat soft, easy-to-digest foods. ? Rest often.  Take over-the-counter or prescription medicines only as told by your health care provider.  It is up to you to get the results of your procedure. Ask your health care provider, or the department performing the procedure, when your results will be ready. Relieving cramping and bloating  Try walking around when you have cramps or feel bloated.  Apply heat to your abdomen as told by your health care provider. Use a heat source that your health care  provider recommends, such as a moist heat pack or a heating pad. ? Place a towel between your skin and the heat source. ? Leave the heat on for 20-30 minutes. ? Remove the heat if your skin turns bright red. This is especially important if you are unable to feel pain, heat, or cold. You may have a greater risk of getting burned. Eating and drinking  Drink enough fluid to keep your urine clear or pale yellow.  Resume your normal diet as instructed by your health care provider. Avoid heavy or fried foods that are hard to digest.  Avoid drinking alcohol for as long as instructed by your health care provider. Contact a health care provider if:  You have blood in your stool 2-3 days after the procedure. Get help right away if:  You have more than a small spotting of blood in your stool.  You pass large blood clots in your stool.  Your abdomen is swollen.  You have nausea or vomiting.  You have a fever.  You have increasing abdominal pain that is not relieved with medicine. This information is not intended to replace advice given to you by your health care provider. Make sure you discuss any questions you have with your health care provider. Document Released: 12/16/2003 Document Revised: 01/26/2016 Document Reviewed: 07/15/2015 Elsevier Interactive  Patient Education  2018 [employer]  Upper Endoscopy, Adult, Care After This sheet gives you information about how to care for yourself after your procedure. Your health care provider may also give you more specific instructions. If you have problems or questions, contact your health care provider. What can I expect after the procedure? After the procedure, it is common to have:  A sore throat.  Mild stomach pain or discomfort.  Bloating.  Nausea. Follow these instructions at home:  Follow instructions from your health care provider about what to eat or drink after your procedure.  Return to your normal activities as told by  your health care provider. Ask your health care provider what activities are safe for you.  Take over-the-counter and prescription medicines only as told by your health care provider.  If you were given a sedative during the procedure, it can affect you for several hours. Do not drive or operate machinery until your health care provider says that it is safe.  Keep all follow-up visits as told by your health care provider. This is important.   Contact a health care provider if you have:  A sore throat that lasts longer than one day.  Trouble swallowing. Get help right away if:  You vomit blood or your vomit looks like coffee grounds.  You have: ? A fever. ? Bloody, black, or tarry stools. ? A severe sore throat or you cannot swallow. ? Difficulty breathing. ? Severe pain in your chest or abdomen. Summary  After the procedure, it is common to have a sore throat, mild stomach discomfort, bloating, and nausea.  If you were given a sedative during the procedure, it can affect you for several hours. Do not drive or operate machinery until your health care provider says that it is safe.  Follow instructions from your health care provider about what to eat or drink after your procedure.  Return to your normal activities as told by your health care provider. This information is not intended to replace advice given to you by your health care provider. Make sure you discuss any questions you have with your health care provider. Document Revised: 05/01/2019 Document Reviewed: 10/03/2017 Elsevier Patient Education  2021 [employer].

## 2020-09-11 ENCOUNTER — Encounter (HOSPITAL_COMMUNITY): Payer: Self-pay

## 2020-09-11 ENCOUNTER — Telehealth: Payer: Self-pay | Admitting: Internal Medicine

## 2020-09-11 ENCOUNTER — Encounter (HOSPITAL_COMMUNITY)
Admission: RE | Admit: 2020-09-11 | Discharge: 2020-09-11 | Disposition: A | Discharge status: Home / Self Care | Payer: 59 | Source: Ambulatory Visit | Attending: Internal Medicine | Admitting: Internal Medicine

## 2020-09-11 ENCOUNTER — Telehealth: Payer: Self-pay | Admitting: *Deleted

## 2020-09-11 HISTORY — DX: Unspecified asthma, uncomplicated: J45.909

## 2020-09-11 NOTE — Telephone Encounter (Signed)
Pt returned call and then I tried to call back. No answer and VM now full. Pt needs to call endo as she missed her pre-op/covid test appt If she wants to proceed with procedure

## 2020-09-11 NOTE — Telephone Encounter (Signed)
-----   Message from Jethro Bolus, RN sent at 09/11/2020 10:09 AM EDT ----- Patient was a no show for her PAT testing and Covid testing today.  Attempted to contact patient but no answer.

## 2020-09-11 NOTE — Telephone Encounter (Signed)
Called pt. LMOVM 

## 2020-09-11 NOTE — Telephone Encounter (Signed)
See prior note

## 2020-09-11 NOTE — Telephone Encounter (Signed)
Patient returned call,  Please call back.

## 2020-09-15 ENCOUNTER — Telehealth: Payer: Self-pay | Admitting: *Deleted

## 2020-09-15 NOTE — Telephone Encounter (Signed)
Noted. See prior notes we have tried reaching patient. FYI to [facility]

## 2020-09-15 NOTE — Telephone Encounter (Signed)
-----   Message from Elsie Amis, RN sent at 09/15/2020  2:06 PM EDT ----- Regarding: called to cancel Ohhhh Mylanta! Emma Morris just called here to cancel her procedure for tomorrow!. Ill let scheduling know.

## 2020-09-16 ENCOUNTER — Encounter (HOSPITAL_COMMUNITY): Admission: RE | Payer: Self-pay | Source: Home / Self Care

## 2020-09-16 ENCOUNTER — Ambulatory Visit (HOSPITAL_COMMUNITY): Admission: RE | Admit: 2020-09-16 | Payer: 59 | Source: Home / Self Care

## 2020-09-16 SURGERY — COLONOSCOPY WITH PROPOFOL
Anesthesia: Monitor Anesthesia Care

## 2020-09-17 NOTE — Telephone Encounter (Signed)
Noted  

## 2021-01-20 ENCOUNTER — Ambulatory Visit: Payer: 59 | Admitting: Orthopedic Surgery

## 2021-02-03 ENCOUNTER — Other Ambulatory Visit: Payer: Self-pay

## 2021-02-03 ENCOUNTER — Ambulatory Visit (INDEPENDENT_AMBULATORY_CARE_PROVIDER_SITE_OTHER): Payer: Medicaid Other | Admitting: Orthopedic Surgery

## 2021-02-03 ENCOUNTER — Encounter: Payer: Self-pay | Admitting: Orthopedic Surgery

## 2021-02-03 VITALS — BP 145/94 | HR 87 | Ht 62.0 in | Wt 266.0 lb

## 2021-02-03 DIAGNOSIS — Z6841 Body Mass Index (BMI) 40.0 and over, adult: Secondary | ICD-10-CM

## 2021-02-03 DIAGNOSIS — M1711 Unilateral primary osteoarthritis, right knee: Secondary | ICD-10-CM | POA: Diagnosis not present

## 2021-02-03 DIAGNOSIS — M1712 Unilateral primary osteoarthritis, left knee: Secondary | ICD-10-CM

## 2021-02-03 MED ORDER — TRAMADOL HCL 50 MG PO TABS: 50.0000 mg | ORAL_TABLET | Freq: Four times a day (QID) | ORAL | 0 refills | Status: DC | PRN

## 2021-02-03 NOTE — Progress Notes (Signed)
Orthopaedic Clinic Return  Assessment: Emma Morris is a 60 y.o. female with the following: Bilateral knee arthritis  Plan: Ongoing bilateral knee pain, left worse than right.  Severe OA on XR. BMI over 40, so is not a good candidate for TKA.  She is aware.  Can repeat injections today.   Procedure note injection - Bilateral  Knee joint   Verbal consent was obtained to inject the Bilateral  Knee joint  Timeout was completed to confirm the site of injection.  The skin was prepped with alcohol and ethyl chloride was sprayed at the injection site.  A 21-gauge needle was used to inject 6 mg of Betamethasone and 1% lidocaine (3 cc) into the Bilateral  Knee using an Anterolateral approach.  There were no complications. A sterile bandage was applied.   Body mass index is 48.65 kg/m.   The patient meets the AMA guidelines for Morbid obesity with BMI > 40.  The patient has been counseled on weight loss.    Follow-up: Return if symptoms worsen or fail to improve.   Subjective:  Chief Complaint  Patient presents with   Knee Pain    Bilat knee pain , states she received injections and they lasted for 1 month.     History of Present Illness: Emma Morris is a 60 y.o. female who presents for repeat evaluation of her bilateral knees.  She was last seen in clinic about 6 months ago and both knees were injected.  She had improvement in her symptoms for approximately 1 month.  Pain has worsened.  She is interested in repeat injections today.   Review of Systems: No fevers or chills No numbness or tingling No chest pain No shortness of breath No bowel or bladder dysfunction No GI distress No headaches  Objective: BP (!) 145/94   Pulse 87   Ht 5\' 2"  (1.575 m)   Wt 266 lb (120.7 kg)   BMI 48.65 kg/m   Physical Exam:  Obese female.  No acute distress.  Alert and oriented.   Bilateral knees, diffuse anterior pain.  Tenderness along medial and lateral joint line.   Crepitus with ROM.  Varus alignment.  No increased laxity to varus or valgus stress.   IMAGING: I personally ordered and reviewed the following images:  No new imaging obtained today.   , MD 02/03/2021 10:39 PM

## 2021-02-03 NOTE — Patient Instructions (Signed)

## 2021-02-17 ENCOUNTER — Ambulatory Visit: Payer: 59 | Admitting: Gastroenterology

## 2021-04-15 ENCOUNTER — Ambulatory Visit: Payer: Medicaid Other | Admitting: Gastroenterology

## 2021-05-27 ENCOUNTER — Encounter: Payer: Self-pay | Admitting: Gastroenterology

## 2021-07-07 ENCOUNTER — Ambulatory Visit: Payer: Medicaid Other | Admitting: Gastroenterology

## 2021-08-14 ENCOUNTER — Ambulatory Visit: Payer: 59 | Admitting: Orthopedic Surgery

## 2021-08-21 ENCOUNTER — Ambulatory Visit: Payer: Medicaid Other | Admitting: Gastroenterology

## 2021-09-15 ENCOUNTER — Encounter: Payer: Self-pay | Admitting: Gastroenterology

## 2021-09-15 ENCOUNTER — Ambulatory Visit (INDEPENDENT_AMBULATORY_CARE_PROVIDER_SITE_OTHER): Payer: Medicare Other | Admitting: Gastroenterology

## 2021-09-15 VITALS — BP 145/86 | HR 105 | Temp 97.1°F | Ht 62.0 in | Wt 287.2 lb

## 2021-09-15 DIAGNOSIS — K625 Hemorrhage of anus and rectum: Secondary | ICD-10-CM

## 2021-09-15 DIAGNOSIS — D649 Anemia, unspecified: Secondary | ICD-10-CM | POA: Diagnosis not present

## 2021-09-15 DIAGNOSIS — K5900 Constipation, unspecified: Secondary | ICD-10-CM | POA: Diagnosis not present

## 2021-09-15 MED ORDER — POLYETHYLENE GLYCOL 3350 17 G PO PACK: 17.0000 g | PACK | Freq: Two times a day (BID) | ORAL | 0 refills | Status: DC

## 2021-09-15 NOTE — Progress Notes (Signed)
? ? ? ?GI Office Note   ? ?Referring Provider: Alvina Filbert, MD ?Primary Care Physician:  Alvina Filbert, MD  ?Primary Gastroenterologist: Hennie Duos. Marletta Lor, DO ? ? ?Chief Complaint  ? ?Chief Complaint  ?Patient presents with  ? Follow-up  ?  No current issues to discuss  ? ? ?History of Present Illness  ? ?Emma Morris is a 61 y.o. female presenting today for follow-up.  Last seen in January 2022.  History of microcytic anemia with low iron/iron saturations. Was scheduled for EGD/colonoscopy last year but she cancelled.   ? ?She presents back today for follow up. She has had at least 5 no show/cancelled appointments in the last year. She states she is interested in completing her procedures as previously recommended. She states her last labs were done by Dr. Durene Cal. She states she has had blood transfusions in the past but it was several years ago. She has BM about twice per week, hard stool, associated with straining and blood at times. Appetite good. No heartburn. No abd pain. No melena. ? ?Medications  ? ?Current Outpatient Medications  ?Medication Sig Dispense Refill  ? acetaminophen (TYLENOL) 500 MG tablet Take 1,000 mg by mouth every 8 (eight) hours as needed for moderate pain.    ? albuterol (PROVENTIL HFA;VENTOLIN HFA) 108 (90 Base) MCG/ACT inhaler Inhale 2 puffs into the lungs every 6 (six) hours as needed for wheezing or shortness of breath.    ? amLODipine (NORVASC) 10 MG tablet Take 10 mg by mouth daily.    ? aspirin 81 MG chewable tablet Chew 81 mg by mouth daily.    ? fluticasone (FLONASE) 50 MCG/ACT nasal spray Place into both nostrils.    ? hydrochlorothiazide (HYDRODIURIL) 25 MG tablet Take 25 mg by mouth daily.    ? ?No current facility-administered medications for this visit.  ? ? ?Allergies  ? ?Allergies as of 09/15/2021  ? (No Known Allergies)  ? ?  ?Past Medical History  ? ?Past Medical History:  ?Diagnosis Date  ? Anemia   ? Arthritis   ? Asthma   ? Hypertension   ? Knee pain   ?  Obesity   ? ? ?Past Surgical History  ? ?Past Surgical History:  ?Procedure Laterality Date  ? CESAREAN SECTION    ? DILITATION & CURRETTAGE/HYSTROSCOPY WITH THERMACHOICE ABLATION N/A 05/04/2013  ? Procedure: DILATATION & CURETTAGE/HYSTEROSCOPY WITH THERMACHOICE ABLATION;  Surgeon: Lazaro Arms, MD;  Location: [facility];  Service: Gynecology;  Laterality: N/A;  temperature:87 degrees; total therapy time:14 minutes and 5 seconds  ? ? ?Past Family History  ? ?Family History  ?Problem Relation Age of Onset  ? Cancer Mother   ? Kidney disease Father   ? Diabetes Father   ? Colon cancer Neg Hx   ? ? ?Past Social History  ? ?Social History  ? ?Socioeconomic History  ? Marital status: Married  ?  Spouse name: Not on file  ? Number of children: Not on file  ? Years of education: Not on file  ? Highest education level: Not on file  ?Occupational History  ? Not on file  ?Tobacco Use  ? Smoking status: Never  ? Smokeless tobacco: Never  ?Vaping Use  ? Vaping Use: Never used  ?Substance and Sexual Activity  ? Alcohol use: No  ? Drug use: No  ? Sexual activity: Yes  ?Other Topics Concern  ? Not on file  ?Social History Narrative  ? Not on file  ? ?Social Determinants  of Health  ? ?Financial Resource Strain: Not on file  ?Food Insecurity: Not on file  ?Transportation Needs: Not on file  ?Physical Activity: Not on file  ?Stress: Not on file  ?Social Connections: Not on file  ?Intimate Partner Violence: Not on file  ? ? ?Review of Systems  ? ?General: Negative for anorexia, weight loss, fever, chills, fatigue, weakness. ?ENT: Negative for hoarseness, difficulty swallowing , nasal congestion. ?CV: Negative for chest pain, angina, palpitations, dyspnea on exertion, peripheral edema.  ?Respiratory: Negative for dyspnea at rest, dyspnea on exertion, cough, sputum, wheezing.  ?GI: See history of present illness. ?GU:  Negative for dysuria, hematuria, urinary incontinence, urinary frequency, nocturnal urination.  ?Endo: Negative for  unusual weight change.  ?   ?Physical Exam  ? ?BP (!) 145/86 (BP Location: Right Arm, Patient Position: Sitting, Cuff Size: Large)   Pulse (!) 105   Temp (!) 97.1 ?F (36.2 ?C) (Temporal)   Ht 5\' 2"  (1.575 m)   Wt 287 lb 3.2 oz (130.3 kg)   SpO2 94%   BMI 52.53 kg/m?  ?  ?General: Well-nourished, well-developed in no acute distress.  ?Eyes: No icterus. ?Mouth: Oropharyngeal mucosa moist and pink , no lesions erythema or exudate. ?Lungs: Clear to auscultation bilaterally.  ?Heart: Regular rate and rhythm, no murmurs rubs or gallops.  ?Abdomen: Bowel sounds are normal, nontender, nondistended, no hepatosplenomegaly or masses,  ?no abdominal bruits or hernia , no rebound or guarding.  ?Rectal: not performed ?Extremities: No lower extremity edema. No clubbing or deformities. ?Neuro: Alert and oriented x 4   ?Skin: Warm and dry, no jaundice.   ?Psych: Alert and cooperative, normal mood and affect. ? ?Labs  ? ?No recent labs available at this time.  ? ?Imaging Studies  ? ?No results found. ? ?Assessment  ? ?Microcytic anemia: suspected IDA given low iron/sat. Ferritin was normal. Has been advised EGD/colonoscopy to evaluate but cancelled previously. She is interested in completing work up at this time. Will need to update labs.  ? ?Constipation: poorly controlled.   ? ? ?PLAN  ? ?CBC, CMET, iron/b12/folate. ?EGD/colonoscopy to be scheduled in near future. Await lab results first. ASA 3.  I have discussed the risks, alternatives, benefits with regards to but not limited to the risk of reaction to medication, bleeding, infection, perforation and the patient is agreeable to proceed. Written consent to be obtained. ?Start miralax 17 grams twice daily until soft stool, then once to twice daily to maintain regular BMs.  ?    ? ? ? . Shantale Holtmeyer, MHS, PA-C ?[facility] ? ?

## 2021-09-15 NOTE — Patient Instructions (Signed)
Please take our labs to PCP to have drawn as soon as you can. We will be in touch with results as available. ?We will call to schedule upper endoscopy and colonoscopy once we have received your lab results. ?Start on miralax, this is over the counter. Take one packet in 4-6 ounces of water twice daily until soft stool then continue once to twice daily to maintain regular stools. ?

## 2021-09-16 LAB — COMPREHENSIVE METABOLIC PANEL
ALT: 7 IU/L (ref 0–32)
AST: 13 IU/L (ref 0–40)
Albumin/Globulin Ratio: 0.7 — ABNORMAL LOW (ref 1.2–2.2)
Albumin: 3.9 g/dL (ref 3.8–4.9)
Alkaline Phosphatase: 63 IU/L (ref 44–121)
BUN/Creatinine Ratio: 12 (ref 12–28)
BUN: 9 mg/dL (ref 8–27)
Bilirubin Total: 0.4 mg/dL (ref 0.0–1.2)
CO2: 28 mmol/L (ref 20–29)
Calcium: 9.4 mg/dL (ref 8.7–10.3)
Chloride: 95 mmol/L — ABNORMAL LOW (ref 96–106)
Creatinine, Ser: 0.75 mg/dL (ref 0.57–1.00)
Globulin, Total: 5.5 g/dL — ABNORMAL HIGH (ref 1.5–4.5)
Glucose: 95 mg/dL (ref 70–99)
Potassium: 3.5 mmol/L (ref 3.5–5.2)
Sodium: 138 mmol/L (ref 134–144)
Total Protein: 9.4 g/dL — ABNORMAL HIGH (ref 6.0–8.5)
eGFR: 91 mL/min/{1.73_m2} (ref >59)

## 2021-09-16 LAB — IRON,TIBC AND FERRITIN PANEL
Ferritin: 190 ng/mL — ABNORMAL HIGH (ref 15–150)
Iron Saturation: 14 % — ABNORMAL LOW (ref 15–55)
Iron: 32 ug/dL (ref 27–159)
Total Iron Binding Capacity: 229 ug/dL — ABNORMAL LOW (ref 250–450)
UIBC: 197 ug/dL (ref 131–425)

## 2021-09-16 LAB — CBC WITH DIFFERENTIAL/PLATELET
Basophils Absolute: 0 10*3/uL (ref 0.0–0.2)
Basos: 0 %
EOS (ABSOLUTE): 0.2 10*3/uL (ref 0.0–0.4)
Eos: 3 %
Hematocrit: 38.3 % (ref 34.0–46.6)
Hemoglobin: 12 g/dL (ref 11.1–15.9)
Immature Grans (Abs): 0 10*3/uL (ref 0.0–0.1)
Immature Granulocytes: 0 %
Lymphocytes Absolute: 1.4 10*3/uL (ref 0.7–3.1)
Lymphs: 19 %
MCH: 24.8 pg — ABNORMAL LOW (ref 26.6–33.0)
MCHC: 31.3 g/dL — ABNORMAL LOW (ref 31.5–35.7)
MCV: 79 fL (ref 79–97)
Monocytes Absolute: 0.3 10*3/uL (ref 0.1–0.9)
Monocytes: 5 %
Neutrophils Absolute: 5.1 10*3/uL (ref 1.4–7.0)
Neutrophils: 73 %
Platelets: 305 10*3/uL (ref 150–450)
RBC: 4.84 x10E6/uL (ref 3.77–5.28)
RDW: 17.1 % — ABNORMAL HIGH (ref 11.7–15.4)
WBC: 7 10*3/uL (ref 3.4–10.8)

## 2021-09-16 LAB — B12 AND FOLATE PANEL
Folate: 3.7 ng/mL (ref >3.0)
Vitamin B-12: 508 pg/mL (ref 232–1245)

## 2021-09-29 ENCOUNTER — Other Ambulatory Visit: Payer: Self-pay

## 2021-09-29 DIAGNOSIS — R7989 Other specified abnormal findings of blood chemistry: Secondary | ICD-10-CM

## 2021-09-29 DIAGNOSIS — D509 Iron deficiency anemia, unspecified: Secondary | ICD-10-CM

## 2021-09-29 MED ORDER — CLENPIQ 10-3.5-12 MG-GM -GM/175ML PO SOLN: 1.0000 | ORAL | 0 refills | Status: DC

## 2021-10-19 ENCOUNTER — Other Ambulatory Visit: Payer: Self-pay

## 2021-10-19 DIAGNOSIS — R7989 Other specified abnormal findings of blood chemistry: Secondary | ICD-10-CM

## 2021-10-19 DIAGNOSIS — D509 Iron deficiency anemia, unspecified: Secondary | ICD-10-CM

## 2021-11-03 NOTE — Patient Instructions (Signed)
Emma Morris  11/03/2021     @PREFPERIOPPHARMACY @   Your procedure is scheduled on  11/09/2021.   Report to 11/11/2021 at  0915  A.M.   Call this number if you have problems the morning of surgery:  [phone]   Remember:  Follow the diet and prep instructions given to you by the office.     Use your inhaler before you come and bring your rescue inhaler with you.     Take these medicines the morning of surgery with A SIP OF WATER                                    norvasc.     Do not wear jewelry, make-up or nail polish.  Do not wear lotions, powders, or perfumes, or deodorant.  Do not shave 48 hours prior to surgery.  Men may shave face and neck.  Do not bring valuables to the hospital.  [facility] is not responsible for any belongings or valuables.  Contacts, dentures or bridgework may not be worn into surgery.  Leave your suitcase in the car.  After surgery it may be brought to your room.  For patients admitted to the hospital, discharge time will be determined by your treatment team.  Patients discharged the day of surgery will not be allowed to drive home and must have someone with them for 24 hours.    Special instructions:   DO NOT smoke tobacco or vape for 24 hours before your procedure.  Please read over the following fact sheets that you were given. Anesthesia Post-op Instructions and Care and Recovery After Surgery      Upper Endoscopy, Adult, Care After This sheet gives you information about how to care for yourself after your procedure. Your health care provider may also give you more specific instructions. If you have problems or questions, contact your health care provider. What can I expect after the procedure? After the procedure, it is common to have: A sore throat. Mild stomach pain or discomfort. Bloating. Nausea. Follow these instructions at home:  Follow instructions from your health care provider about what to eat  or drink after your procedure. Return to your normal activities as told by your health care provider. Ask your health care provider what activities are safe for you. Take over-the-counter and prescription medicines only as told by your health care provider. If you were given a sedative during the procedure, it can affect you for several hours. Do not drive or operate machinery until your health care provider says that it is safe. Keep all follow-up visits as told by your health care provider. This is important. Contact a health care provider if you have: A sore throat that lasts longer than one day. Trouble swallowing. Get help right away if: You vomit blood or your vomit looks like coffee grounds. You have: A fever. Bloody, black, or tarry stools. A severe sore throat or you cannot swallow. Difficulty breathing. Severe pain in your chest or abdomen. Summary After the procedure, it is common to have a sore throat, mild stomach discomfort, bloating, and nausea. If you were given a sedative during the procedure, it can affect you for several hours. Do not drive or operate machinery until your health care provider says that it is safe. Follow instructions from your health care provider about what to  eat or drink after your procedure. Return to your normal activities as told by your health care provider. This information is not intended to replace advice given to you by your health care provider. Make sure you discuss any questions you have with your health care provider. Document Revised: 03/09/2019 Document Reviewed: 10/03/2017 Elsevier Patient Education  2023 [employer] Colonoscopy, Adult, Care After The following information offers guidance on how to care for yourself after your procedure. Your health care provider may also give you more specific instructions. If you have problems or questions, contact your health care provider. What can I expect after the procedure? After the  procedure, it is common to have: A small amount of blood in your stool for 24 hours after the procedure. Some gas. Mild cramping or bloating of your abdomen. Follow these instructions at home: Eating and drinking  Drink enough fluid to keep your urine pale yellow. Follow instructions from your health care provider about eating or drinking restrictions. Resume your normal diet as told by your health care provider. Avoid heavy or fried foods that are hard to digest. Activity Rest as told by your health care provider. Avoid sitting for a long time without moving. Get up to take short walks every 1-2 hours. This is important to improve blood flow and breathing. Ask for help if you feel weak or unsteady. Return to your normal activities as told by your health care provider. Ask your health care provider what activities are safe for you. Managing cramping and bloating  Try walking around when you have cramps or feel bloated. If directed, apply heat to your abdomen as told by your health care provider. Use the heat source that your health care provider recommends, such as a moist heat pack or a heating pad. Place a towel between your skin and the heat source. Leave the heat on for 20-30 minutes. Remove the heat if your skin turns bright red. This is especially important if you are unable to feel pain, heat, or cold. You have a greater risk of getting burned. General instructions If you were given a sedative during the procedure, it can affect you for several hours. Do not drive or operate machinery until your health care provider says that it is safe. For the first 24 hours after the procedure: Do not sign important documents. Do not drink alcohol. Do your regular daily activities at a slower pace than normal. Eat soft foods that are easy to digest. Take over-the-counter and prescription medicines only as told by your health care provider. Keep all follow-up visits. This is important. Contact  a health care provider if: You have blood in your stool 2-3 days after the procedure. Get help right away if: You have more than a small spotting of blood in your stool. You have large blood clots in your stool. You have swelling of your abdomen. You have nausea or vomiting. You have a fever. You have increasing pain in your abdomen that is not relieved with medicine. These symptoms may be an emergency. Get help right away. Call 911. Do not wait to see if the symptoms will go away. Do not drive yourself to the hospital. Summary After the procedure, it is common to have a small amount of blood in your stool. You may also have mild cramping and bloating of your abdomen. If you were given a sedative during the procedure, it can affect you for several hours. Do not drive or operate machinery until your health care provider  says that it is safe. Get help right away if you have a lot of blood in your stool, nausea or vomiting, a fever, or increased pain in your abdomen. This information is not intended to replace advice given to you by your health care provider. Make sure you discuss any questions you have with your health care provider. Document Revised: 12/24/2020 Document Reviewed: 12/24/2020 Elsevier Patient Education  2023 Elsevier Inc. Monitored Anesthesia Care, Care After This sheet gives you information about how to care for yourself after your procedure. Your health care provider may also give you more specific instructions. If you have problems or questions, contact your health care provider. What can I expect after the procedure? After the procedure, it is common to have: Tiredness. Forgetfulness about what happened after the procedure. Impaired judgment for important decisions. Nausea or vomiting. Some difficulty with balance. Follow these instructions at home: For the time period you were told by your health care provider:     Rest as needed. Do not participate in activities  where you could fall or become injured. Do not drive or use machinery. Do not drink alcohol. Do not take sleeping pills or medicines that cause drowsiness. Do not make important decisions or sign legal documents. Do not take care of children on your own. Eating and drinking Follow the diet that is recommended by your health care provider. Drink enough fluid to keep your urine pale yellow. If you vomit: Drink water, juice, or soup when you can drink without vomiting. Make sure you have little or no nausea before eating solid foods. General instructions Have a responsible adult stay with you for the time you are told. It is important to have someone help care for you until you are awake and alert. Take over-the-counter and prescription medicines only as told by your health care provider. If you have sleep apnea, surgery and certain medicines can increase your risk for breathing problems. Follow instructions from your health care provider about wearing your sleep device: Anytime you are sleeping, including during daytime naps. While taking prescription pain medicines, sleeping medicines, or medicines that make you drowsy. Avoid smoking. Keep all follow-up visits as told by your health care provider. This is important. Contact a health care provider if: You keep feeling nauseous or you keep vomiting. You feel light-headed. You are still sleepy or having trouble with balance after 24 hours. You develop a rash. You have a fever. You have redness or swelling around the IV site. Get help right away if: You have trouble breathing. You have new-onset confusion at home. Summary For several hours after your procedure, you may feel tired. You may also be forgetful and have poor judgment. Have a responsible adult stay with you for the time you are told. It is important to have someone help care for you until you are awake and alert. Rest as told. Do not drive or operate machinery. Do not drink  alcohol or take sleeping pills. Get help right away if you have trouble breathing, or if you suddenly become confused. This information is not intended to replace advice given to you by your health care provider. Make sure you discuss any questions you have with your health care provider. Document Revised: 04/07/2021 Document Reviewed: 04/05/2019 Elsevier Patient Education  2023 [employer].

## 2021-11-05 ENCOUNTER — Encounter (HOSPITAL_COMMUNITY)
Admission: RE | Admit: 2021-11-05 | Discharge: 2021-11-05 | Disposition: A | Discharge status: Home / Self Care | Payer: Medicare Other | Source: Ambulatory Visit | Attending: Internal Medicine | Admitting: Internal Medicine

## 2021-11-05 ENCOUNTER — Other Ambulatory Visit: Payer: Self-pay

## 2021-11-05 ENCOUNTER — Encounter (HOSPITAL_COMMUNITY): Payer: Self-pay

## 2021-11-05 DIAGNOSIS — Z01812 Encounter for preprocedural laboratory examination: Secondary | ICD-10-CM | POA: Insufficient documentation

## 2021-11-09 ENCOUNTER — Ambulatory Visit (HOSPITAL_COMMUNITY)
Admission: RE | Admit: 2021-11-09 | Discharge: 2021-11-09 | Disposition: A | Discharge status: Home / Self Care | Payer: Medicare Other | Attending: Internal Medicine | Admitting: Internal Medicine

## 2021-11-09 ENCOUNTER — Encounter (HOSPITAL_COMMUNITY): Payer: Self-pay

## 2021-11-09 ENCOUNTER — Ambulatory Visit (HOSPITAL_BASED_OUTPATIENT_CLINIC_OR_DEPARTMENT_OTHER): Payer: Medicare Other

## 2021-11-09 ENCOUNTER — Ambulatory Visit (HOSPITAL_COMMUNITY): Payer: Medicare Other

## 2021-11-09 ENCOUNTER — Encounter (HOSPITAL_COMMUNITY)
Admission: RE | Disposition: A | Discharge status: Home / Self Care | Payer: Self-pay | Source: Home / Self Care | Attending: Internal Medicine

## 2021-11-09 DIAGNOSIS — I10 Essential (primary) hypertension: Secondary | ICD-10-CM | POA: Diagnosis not present

## 2021-11-09 DIAGNOSIS — K648 Other hemorrhoids: Secondary | ICD-10-CM | POA: Insufficient documentation

## 2021-11-09 DIAGNOSIS — Z6841 Body Mass Index (BMI) 40.0 and over, adult: Secondary | ICD-10-CM | POA: Diagnosis not present

## 2021-11-09 DIAGNOSIS — D759 Disease of blood and blood-forming organs, unspecified: Secondary | ICD-10-CM | POA: Insufficient documentation

## 2021-11-09 DIAGNOSIS — D509 Iron deficiency anemia, unspecified: Secondary | ICD-10-CM | POA: Insufficient documentation

## 2021-11-09 DIAGNOSIS — D649 Anemia, unspecified: Secondary | ICD-10-CM | POA: Diagnosis not present

## 2021-11-09 DIAGNOSIS — K625 Hemorrhage of anus and rectum: Secondary | ICD-10-CM

## 2021-11-09 DIAGNOSIS — K449 Diaphragmatic hernia without obstruction or gangrene: Secondary | ICD-10-CM

## 2021-11-09 DIAGNOSIS — R0902 Hypoxemia: Secondary | ICD-10-CM | POA: Insufficient documentation

## 2021-11-09 DIAGNOSIS — J45909 Unspecified asthma, uncomplicated: Secondary | ICD-10-CM | POA: Insufficient documentation

## 2021-11-09 HISTORY — PX: COLONOSCOPY WITH PROPOFOL: SHX5780

## 2021-11-09 HISTORY — PX: ESOPHAGOGASTRODUODENOSCOPY (EGD) WITH PROPOFOL: SHX5813

## 2021-11-09 SURGERY — COLONOSCOPY WITH PROPOFOL
Anesthesia: General

## 2021-11-09 MED ORDER — PROPOFOL 500 MG/50ML IV EMUL
INTRAVENOUS | Status: DC | PRN
  Administered 2021-11-09: 200 ug/kg/min via INTRAVENOUS

## 2021-11-09 MED ORDER — GLYCOPYRROLATE PF 0.2 MG/ML IJ SOSY
PREFILLED_SYRINGE | INTRAMUSCULAR | Status: DC | PRN
  Administered 2021-11-09: .2 mg via INTRAVENOUS

## 2021-11-09 MED ORDER — PROPOFOL 10 MG/ML IV BOLUS
INTRAVENOUS | Status: DC | PRN
  Administered 2021-11-09: 80 mg via INTRAVENOUS

## 2021-11-09 MED ORDER — LACTATED RINGERS IV SOLN
INTRAVENOUS | Status: DC
Start: 2021-11-09 — End: 2021-11-09

## 2021-11-09 MED ORDER — LIDOCAINE HCL (CARDIAC) PF 100 MG/5ML IV SOSY
PREFILLED_SYRINGE | INTRAVENOUS | Status: DC | PRN
  Administered 2021-11-09: 50 mg via INTRATRACHEAL

## 2021-11-09 NOTE — Anesthesia Preprocedure Evaluation (Signed)
Anesthesia Evaluation  Patient identified by MRN, date of birth, ID band Patient awake    Reviewed: Allergy & Precautions, H&P , NPO status , Patient's Chart, lab work & pertinent test results, reviewed documented beta blocker date and time   Airway Mallampati: II  TM Distance: >3 FB Neck ROM: full    Dental no notable dental hx.    Pulmonary asthma ,    Pulmonary exam normal breath sounds clear to auscultation       Cardiovascular Exercise Tolerance: Good hypertension, negative cardio ROS   Rhythm:regular Rate:Normal     Neuro/Psych negative neurological ROS  negative psych ROS   GI/Hepatic negative GI ROS, Neg liver ROS,   Endo/Other  Morbid obesity  Renal/GU negative Renal ROS  negative genitourinary   Musculoskeletal   Abdominal   Peds  Hematology  (+) Blood dyscrasia, anemia ,   Anesthesia Other Findings   Reproductive/Obstetrics negative OB ROS                             Anesthesia Physical Anesthesia Plan  ASA: 3  Anesthesia Plan: General   Post-op Pain Management:    Induction:   PONV Risk Score and Plan: Propofol infusion  Airway Management Planned:   Additional Equipment:   Intra-op Plan:   Post-operative Plan:   Informed Consent: I have reviewed the patients History and Physical, chart, labs and discussed the procedure including the risks, benefits and alternatives for the proposed anesthesia with the patient or authorized representative who has indicated his/her understanding and acceptance.     Dental Advisory Given  Plan Discussed with: CRNA  Anesthesia Plan Comments:         Anesthesia Quick Evaluation

## 2021-11-09 NOTE — H&P (Signed)
Primary Care Physician:  Alvina Filbert, MD Primary Gastroenterologist:  Dr. Marletta Lor  Pre-Procedure History & Physical: HPI:  Emma Morris is a 61 y.o. female is here for an EGD and colonoscopy due to history of iron deficiency anemia and rectal bleeding.  Past Medical History:  Diagnosis Date   Anemia    Arthritis    Asthma    Hypertension    Knee pain    Obesity     Past Surgical History:  Procedure Laterality Date   CESAREAN SECTION     DILITATION & CURRETTAGE/HYSTROSCOPY WITH THERMACHOICE ABLATION N/A 05/04/2013   Procedure: DILATATION & CURETTAGE/HYSTEROSCOPY WITH THERMACHOICE ABLATION;  Surgeon: Lazaro Arms, MD;  Location: [facility];  Service: Gynecology;  Laterality: N/A;  temperature:87 degrees; total therapy time:14 minutes and 5 seconds   TONSILLECTOMY      Prior to Admission medications   Medication Sig Start Date End Date Taking? Authorizing Provider  acetaminophen (TYLENOL) 500 MG tablet Take 1,000 mg by mouth every 8 (eight) hours as needed for moderate pain.   Yes [provider]  albuterol (PROVENTIL HFA;VENTOLIN HFA) 108 (90 Base) MCG/ACT inhaler Inhale 2 puffs into the lungs every 6 (six) hours as needed for wheezing or shortness of breath.   Yes [provider]  amLODipine (NORVASC) 10 MG tablet Take 10 mg by mouth daily. 05/17/19  Yes [provider]  fluticasone (FLONASE) 50 MCG/ACT nasal spray Place into both nostrils. 01/27/21  Yes [provider]  hydrochlorothiazide (HYDRODIURIL) 25 MG tablet Take 25 mg by mouth daily.   Yes [provider]  polyethylene glycol (MIRALAX) 17 g packet Take 17 g by mouth 2 (two) times daily. Until soft stool, then continue once or twice daily to maintain regular soft stool. 09/15/21  Yes Tiffany Kocher, PA-C  Sod Picosulfate-Mag Ox-Cit Acd (CLENPIQ) 10-3.5-12 MG-GM -GM/175ML SOLN Take 1 kit by mouth as directed. 09/29/21  Yes Lanelle Bal, DO    Allergies as of 09/29/2021    (No Known Allergies)    Family History  Problem Relation Age of Onset   Cancer Mother    Kidney disease Father    Diabetes Father    Colon cancer Neg Hx     Social History   Socioeconomic History   Marital status: Married    Spouse name: Not on file   Number of children: Not on file   Years of education: Not on file   Highest education level: Not on file  Occupational History   Not on file  Tobacco Use   Smoking status: Never   Smokeless tobacco: Never  Vaping Use   Vaping Use: Never used  Substance and Sexual Activity   Alcohol use: No   Drug use: No   Sexual activity: Yes  Other Topics Concern   Not on file  Social History Narrative   Not on file   Social Determinants of Health   Financial Resource Strain: Not on file  Food Insecurity: Not on file  Transportation Needs: Not on file  Physical Activity: Not on file  Stress: Not on file  Social Connections: Not on file  Intimate Partner Violence: Not on file    Review of Systems: See HPI, otherwise negative ROS  Physical Exam: Vital signs in last 24 hours: Temp:  [98.4 F (36.9 C)] 98.4 F (36.9 C) (06/26 0952) Pulse Rate:  [104] 104 (06/26 0952) Resp:  [18] 18 (06/26 0952) BP: (158)/(87) 158/87 (06/26 0952) SpO2:  [95 %] 95 % (  06/26 0952) Weight:  [126.1 kg] 126.1 kg (06/26 0952)   General:   Alert,  Well-developed, well-nourished, pleasant and cooperative in NAD Head:  Normocephalic and atraumatic. Eyes:  Sclera clear, no icterus.   Conjunctiva pink. Ears:  Normal auditory acuity. Nose:  No deformity, discharge,  or lesions. Mouth:  No deformity or lesions, dentition normal. Neck:  Supple; no masses or thyromegaly. Lungs:  Clear throughout to auscultation.   No wheezes, crackles, or rhonchi. No acute distress. Heart:  Regular rate and rhythm; no murmurs, clicks, rubs,  or gallops. Abdomen:  Soft, nontender and nondistended. No masses, hepatosplenomegaly or hernias noted. Normal bowel sounds,  without guarding, and without rebound.   Msk:  Symmetrical without gross deformities. Normal posture. Extremities:  Without clubbing or edema. Neurologic:  Alert and  oriented x4;  grossly normal neurologically. Skin:  Intact without significant lesions or rashes. Cervical Nodes:  No significant cervical adenopathy. Psych:  Alert and cooperative. Normal mood and affect.  Impression/Plan: [name] is here for an EGD and colonoscopy due to history of iron deficiency anemia and rectal bleeding.  The risks of the procedure including infection, bleed, or perforation as well as benefits, limitations, alternatives and imponderables have been reviewed with the patient. Questions have been answered. All parties agreeable.

## 2021-11-10 NOTE — Anesthesia Postprocedure Evaluation (Signed)
Anesthesia Post Note  Patient: HERMA GEFFERT  Procedure(s) Performed: COLONOSCOPY WITH PROPOFOL ESOPHAGOGASTRODUODENOSCOPY (EGD) WITH PROPOFOL  Patient location during evaluation: Phase II Anesthesia Type: General Level of consciousness: awake Pain management: pain level controlled Vital Signs Assessment: post-procedure vital signs reviewed and stable Respiratory status: spontaneous breathing and respiratory function stable Cardiovascular status: blood pressure returned to baseline and stable Postop Assessment: no headache and no apparent nausea or vomiting Anesthetic complications: no Comments: Late entry   No notable events documented.   Last Vitals:  Vitals:   11/09/21 1145 11/09/21 1149  BP: 125/90 125/90  Pulse: 98 98  Resp: 19 20  Temp: 36.7 C 36.7 C  SpO2: 97% 97%    Last Pain:  Vitals:   11/10/21 1115  TempSrc:   PainSc: 0-No pain                 Windell Norfolk

## 2021-11-13 ENCOUNTER — Encounter (HOSPITAL_COMMUNITY): Payer: Self-pay | Admitting: Internal Medicine

## 2021-12-24 IMAGING — DX DG CHEST 1V PORT
1 series · 1 of 1 positions shown · non-contrast
Comparison: 03/21/2016

CLINICAL DATA: Weakness

EXAM:
PORTABLE CHEST 1 VIEW

[chest ap]
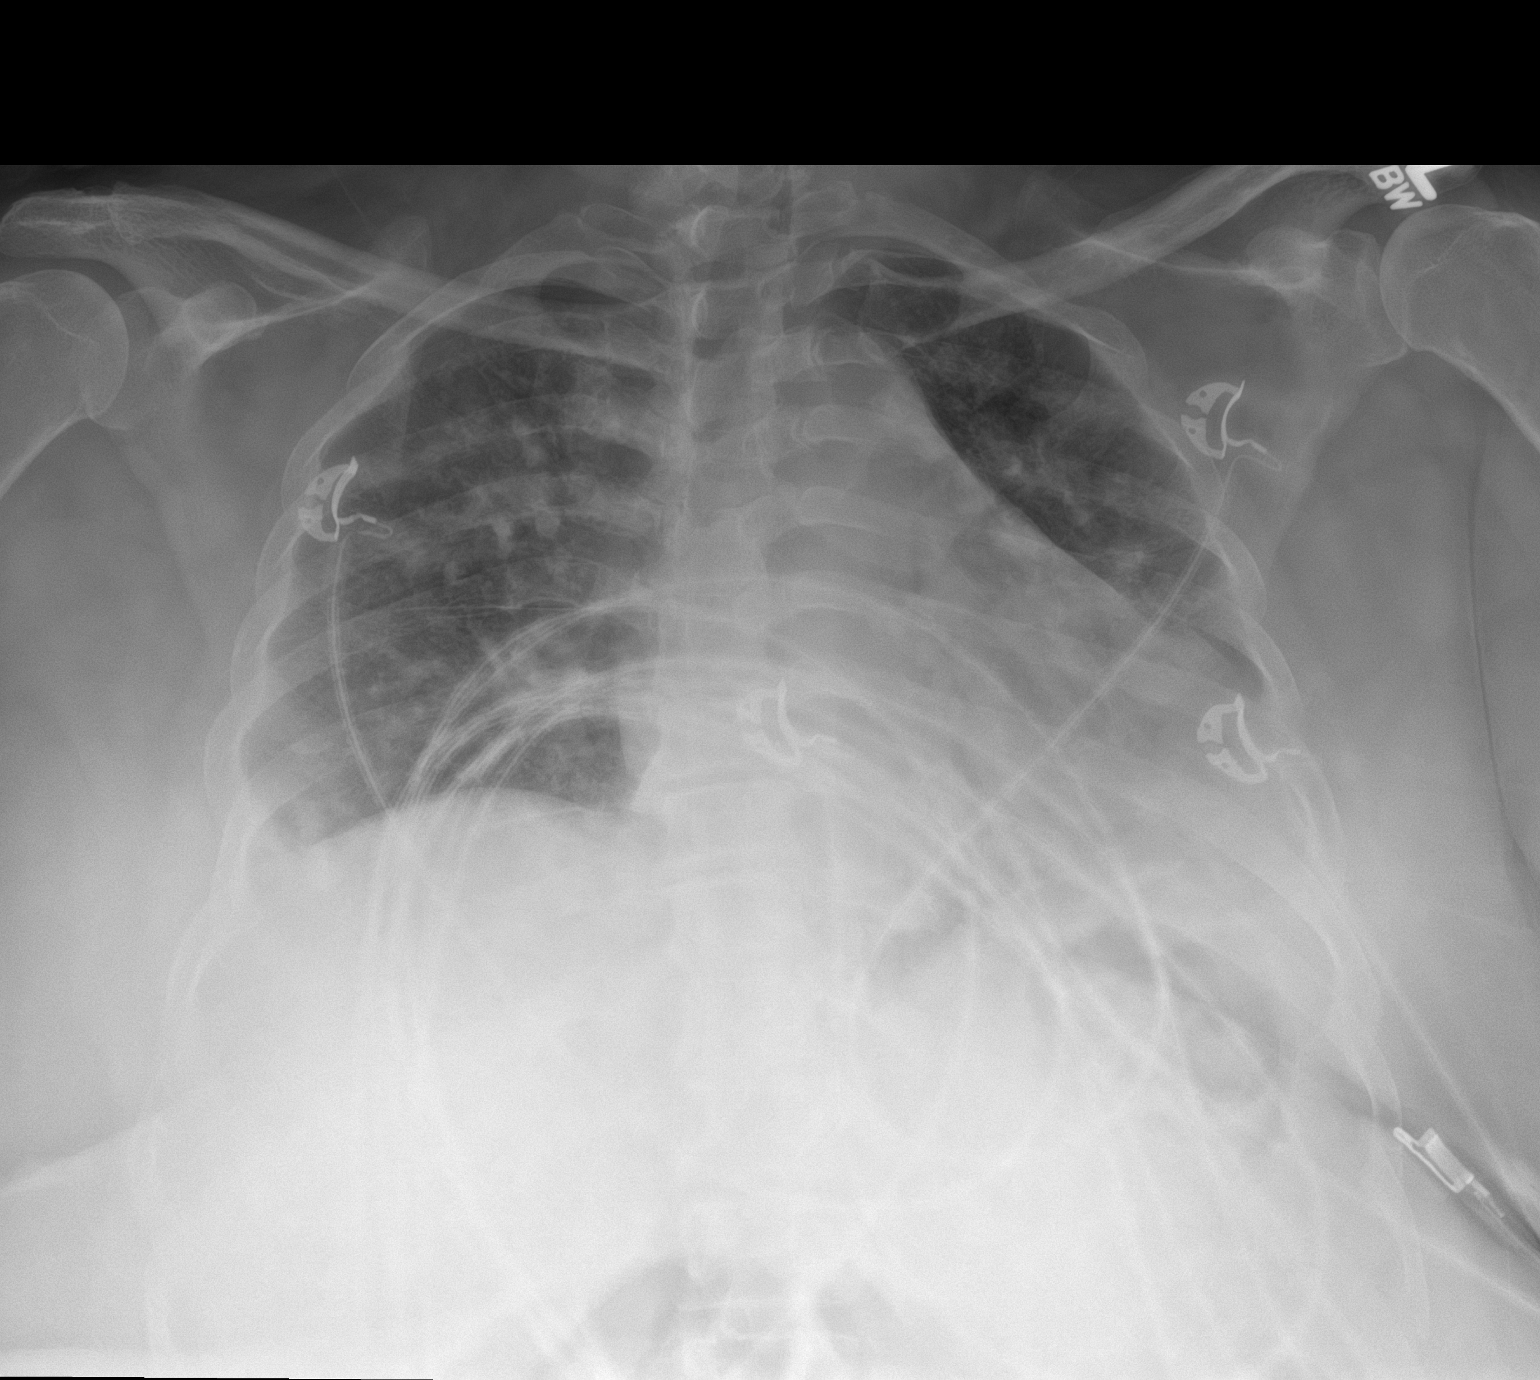

[1 of 1 positions shown; findings below may reference images not displayed]

FINDINGS: Cardiomegaly. Prominent pulmonary vasculature and heterogeneous and
interstitial airspace opacity throughout. The visualized skeletal
structures are unremarkable.
IMPRESSION: Cardiomegaly with prominent pulmonary vascular trigger and
heterogeneous and interstitial airspace opacity throughout. Findings
likely reflect pulmonary edema, although multifocal infection is a
differential consideration.

## 2021-12-24 IMAGING — CT CT CHEST W/O CM
2 of 4 series · 15 of 36 positions shown, 18 images · non-contrast
Comparison: Chest radiograph of earlier today.

CLINICAL DATA: Weakness. Fatigue. Lower extremity swelling.
Left-sided pain. Hypertension.

EXAM:
CT CHEST WITHOUT CONTRAST
TECHNIQUE: Multidetector CT imaging of the chest was performed following the
standard protocol without IV contrast.

[Series 2: routine chest without · axial · non-contrast · 0.77mm/px · z∈[-277,-57]mm · 12 of 131 slices shown, 15 images]
[im 11/131  mediastinal]
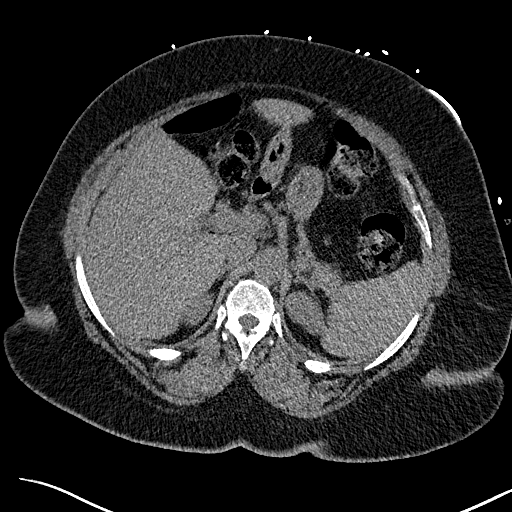
[im 11/131  lung]
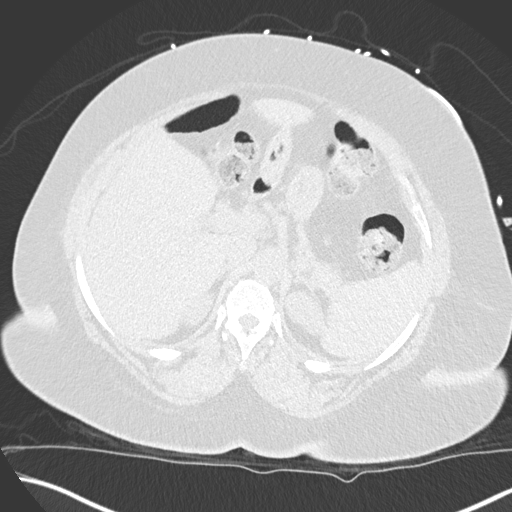
[im 21/131  lung]
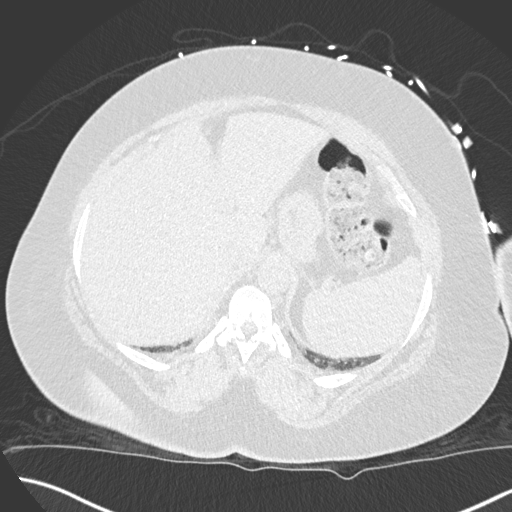
[im 31/131  lung]
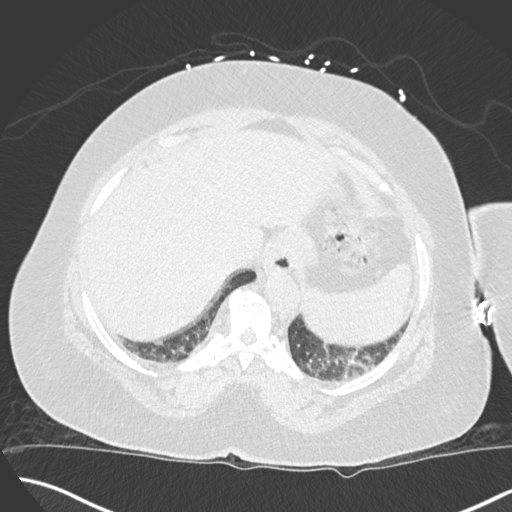
[im 41/131  lung]
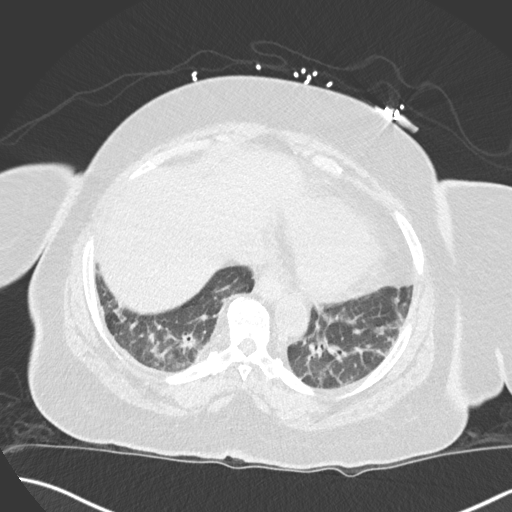
[im 51/131  mediastinal]
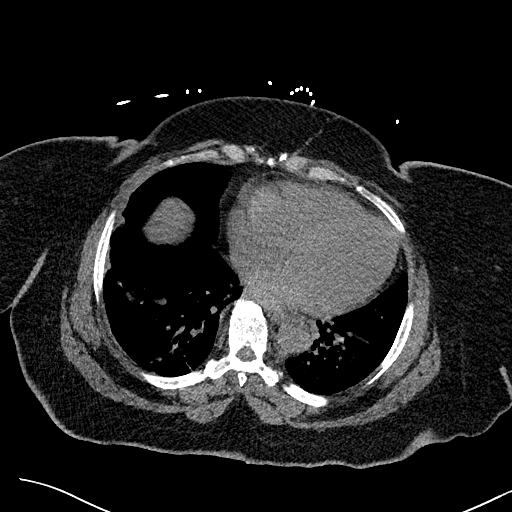
[im 51/131  lung]
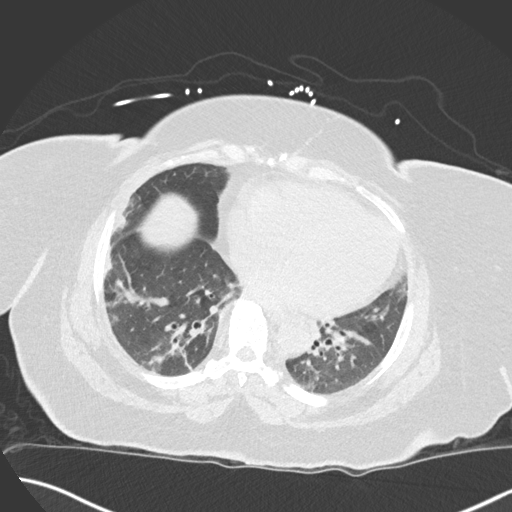
[im 61/131  lung]
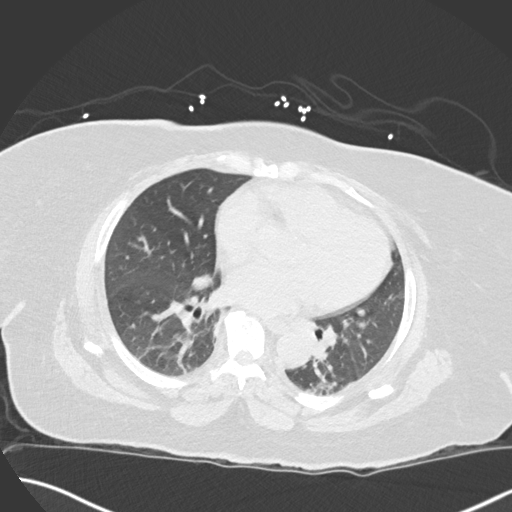
[im 71/131  lung]
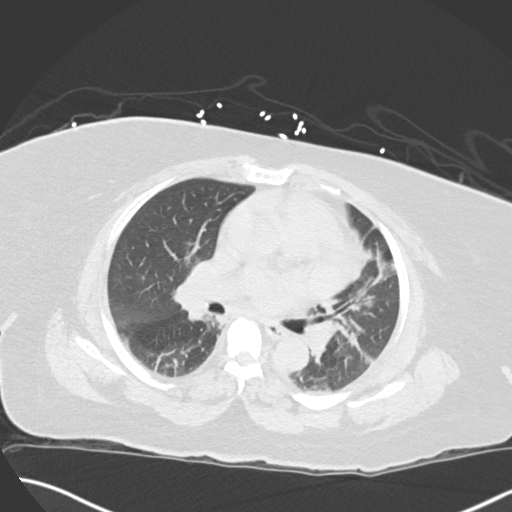
[im 81/131  lung]
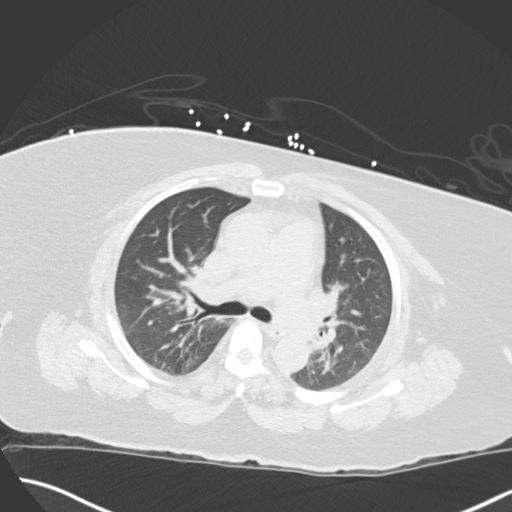
[im 91/131  mediastinal]
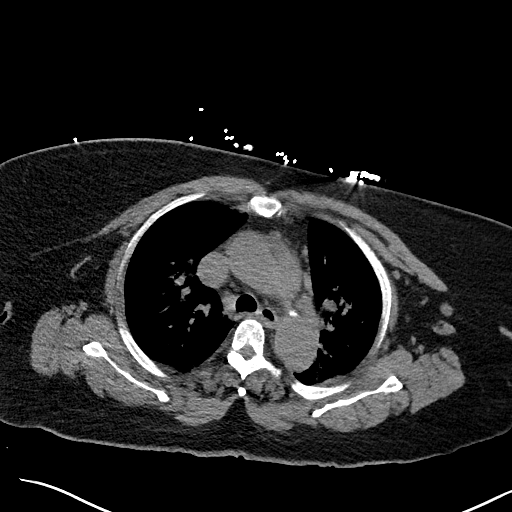
[im 91/131  lung]
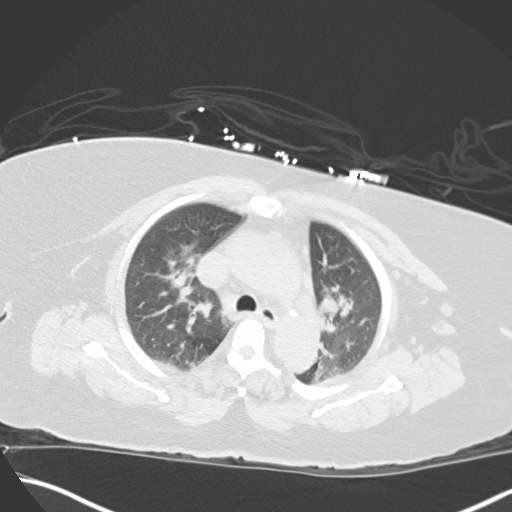
[im 101/131  lung]
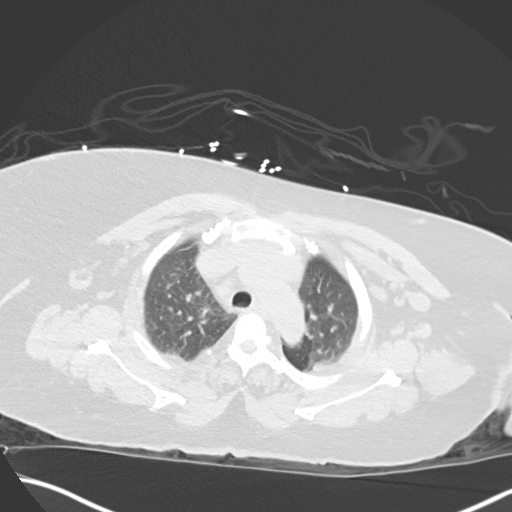
[im 111/131  lung]
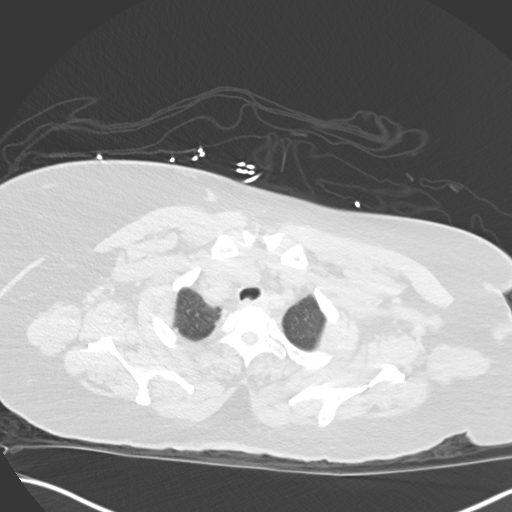
[im 121/131  lung]
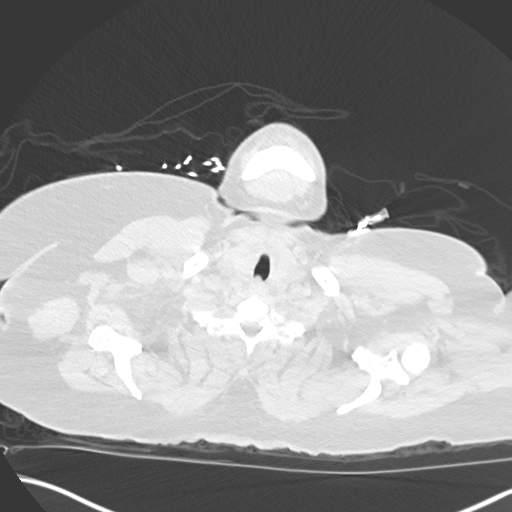

[Series 5: coronal · coronal · 0.54mm/px · 3 of 155 slices shown]
[im 31/155  lung]
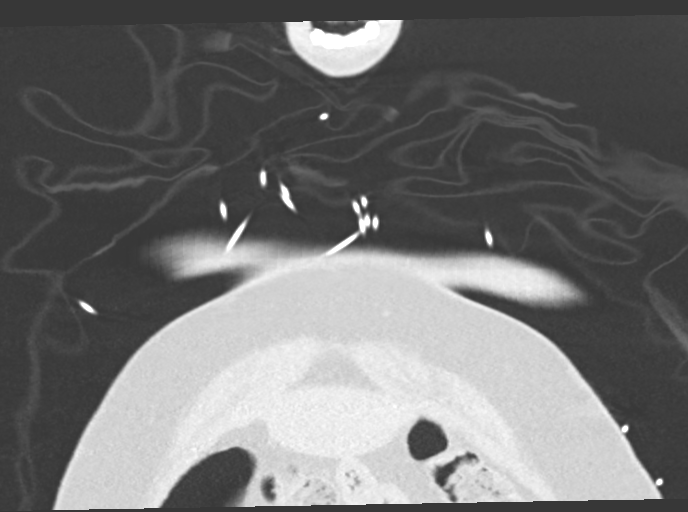
[im 62/155  lung]
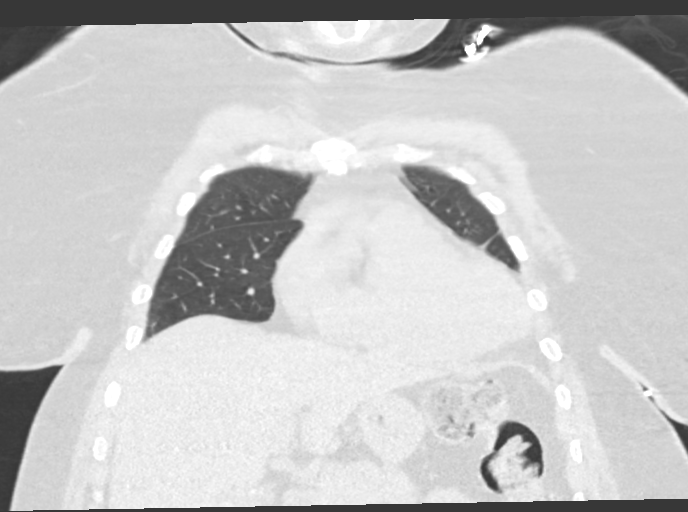
[im 93/155  lung]
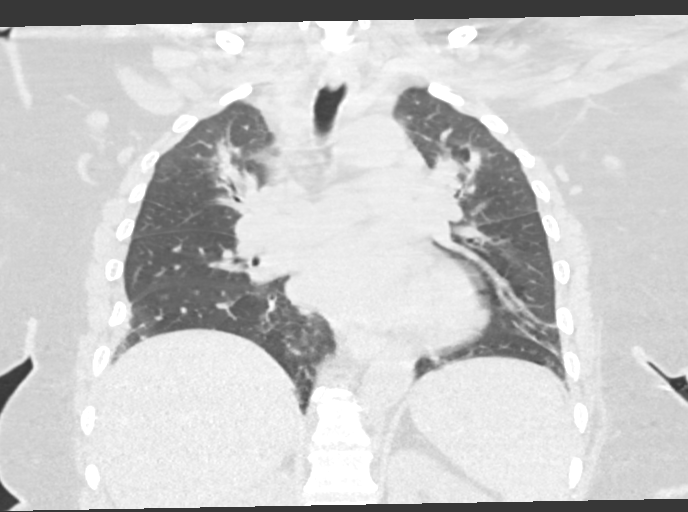

[15 of 36 positions shown; findings below may reference images not displayed]

FINDINGS: Cardiovascular: Moderate degradation secondary to patient body
habitus and mild motion.

Aortic atherosclerosis. Tortuous thoracic aorta. Moderate
cardiomegaly, without pericardial effusion. Pulmonary artery
enlargement, outflow tract 3.4 cm.

Mediastinum/Nodes: Prominent bilateral axillary nodes with
maintenance of fatty hila. Example 1.4 cm left-sided node on 32/2.
Likely reactive.

No mediastinal or definite hilar adenopathy, given limitations of
unenhanced CT. Tiny hiatal hernia.

Lungs/Pleura: No pleural fluid. No pleural fluid. No typical
findings of pulmonary edema. Areas of mild peribronchovascular
ground-glass are identified in both upper lobes. Foci of
interstitial thickening are basilar predominant, likely related to
is hypoventilation and subsegmental atelectasis.

Upper Abdomen: Normal imaged portions of the liver, spleen,
pancreas, adrenal glands, kidneys. Gallstones of up to 1.7 cm.

Musculoskeletal: Lower thoracic spondylosis.
IMPRESSION: 1. Moderately degraded exam, secondary to patient body habitus and
less so motion.
2. Multifocal pulmonary opacities. Many of these are favored to
represent subsegmental atelectasis in the setting of
hypoventilation. Suggestion of ground-glass in the
peribronchovascular upper lobes, for which mild infection cannot be
excluded. Pulmonary edema is felt unlikely, especially given absence
of pleural fluid .
3. Cholelithiasis
4.  Aortic Atherosclerosis ([id number]).
5. Pulmonary artery enlargement suggests pulmonary arterial
hypertension.
6. Cardiomegaly.
7.  Tiny hiatal hernia.

## 2022-03-27 ENCOUNTER — Inpatient Hospital Stay (HOSPITAL_COMMUNITY)
Admission: EM | Admit: 2022-03-27 | Discharge: 2022-03-29 | DRG: 193 | Disposition: A | Discharge status: Home Health | Payer: Medicare Other | Attending: Family Medicine | Admitting: Family Medicine

## 2022-03-27 ENCOUNTER — Encounter (HOSPITAL_COMMUNITY): Payer: Self-pay

## 2022-03-27 ENCOUNTER — Emergency Department (HOSPITAL_COMMUNITY): Payer: Medicare Other

## 2022-03-27 ENCOUNTER — Other Ambulatory Visit: Payer: Self-pay

## 2022-03-27 DIAGNOSIS — J189 Pneumonia, unspecified organism: Principal | ICD-10-CM | POA: Insufficient documentation

## 2022-03-27 DIAGNOSIS — J9601 Acute respiratory failure with hypoxia: Secondary | ICD-10-CM | POA: Diagnosis not present

## 2022-03-27 DIAGNOSIS — I10 Essential (primary) hypertension: Secondary | ICD-10-CM | POA: Insufficient documentation

## 2022-03-27 DIAGNOSIS — E669 Obesity, unspecified: Secondary | ICD-10-CM | POA: Diagnosis present

## 2022-03-27 DIAGNOSIS — Z6841 Body Mass Index (BMI) 40.0 and over, adult: Secondary | ICD-10-CM

## 2022-03-27 DIAGNOSIS — J441 Chronic obstructive pulmonary disease with (acute) exacerbation: Secondary | ICD-10-CM | POA: Insufficient documentation

## 2022-03-27 DIAGNOSIS — I517 Cardiomegaly: Secondary | ICD-10-CM

## 2022-03-27 DIAGNOSIS — Z20822 Contact with and (suspected) exposure to covid-19: Secondary | ICD-10-CM | POA: Diagnosis present

## 2022-03-27 DIAGNOSIS — R600 Localized edema: Secondary | ICD-10-CM | POA: Diagnosis present

## 2022-03-27 DIAGNOSIS — E538 Deficiency of other specified B group vitamins: Secondary | ICD-10-CM | POA: Diagnosis present

## 2022-03-27 DIAGNOSIS — E876 Hypokalemia: Secondary | ICD-10-CM | POA: Insufficient documentation

## 2022-03-27 DIAGNOSIS — I119 Hypertensive heart disease without heart failure: Secondary | ICD-10-CM | POA: Diagnosis present

## 2022-03-27 DIAGNOSIS — Z23 Encounter for immunization: Secondary | ICD-10-CM

## 2022-03-27 DIAGNOSIS — Z79899 Other long term (current) drug therapy: Secondary | ICD-10-CM

## 2022-03-27 DIAGNOSIS — J45901 Unspecified asthma with (acute) exacerbation: Secondary | ICD-10-CM | POA: Diagnosis present

## 2022-03-27 MED ORDER — METHYLPREDNISOLONE SODIUM SUCC 125 MG IJ SOLR
125.0000 mg | Freq: Once | INTRAMUSCULAR | Status: AC
  Administered 2022-03-28: 125 mg via INTRAVENOUS
  Filled 2022-03-27: qty 2

## 2022-03-27 MED ORDER — IPRATROPIUM-ALBUTEROL 0.5-2.5 (3) MG/3ML IN SOLN
3.0000 mL | Freq: Once | RESPIRATORY_TRACT | Status: AC
  Administered 2022-03-28: 3 mL via RESPIRATORY_TRACT
  Filled 2022-03-27: qty 3

## 2022-03-27 NOTE — ED Triage Notes (Signed)
Pt arrived from home via POV c/o cough, body aches, chills and reports trying  multiple OTC medications w/o relief. Pt reports symptoms have been on-going since last week.

## 2022-03-27 NOTE — ED Provider Notes (Signed)
[facility] EMERGENCY DEPARTMENT  Provider Note  CSN: [id number] Arrival date & time: 03/27/22 2255  History Chief Complaint  Patient presents with   Cough    Emma Morris is a 61 y.o. female with history of HTN and asthma reports about a week of cough, SOB, and wheezing. No fever.but has had body aches and chills. She has had some sputum. Symptoms not improved with OTC meds or inhaler at home. She also reports some increased LE edema, no reported history of CHF. Does not wear home oxygen,.    Home Medications Prior to Admission medications   Medication Sig Start Date End Date Taking? Authorizing Provider  acetaminophen (TYLENOL) 500 MG tablet Take 1,000 mg by mouth every 8 (eight) hours as needed for moderate pain.    [provider]  albuterol (PROVENTIL HFA;VENTOLIN HFA) 108 (90 Base) MCG/ACT inhaler Inhale 2 puffs into the lungs every 6 (six) hours as needed for wheezing or shortness of breath.    [provider]  amLODipine (NORVASC) 10 MG tablet Take 10 mg by mouth daily. 05/17/19   [provider]  fluticasone (FLONASE) 50 MCG/ACT nasal spray Place into both nostrils. 01/27/21   [provider]  hydrochlorothiazide (HYDRODIURIL) 25 MG tablet Take 25 mg by mouth daily.    [provider]  polyethylene glycol (MIRALAX) 17 g packet Take 17 g by mouth 2 (two) times daily. Until soft stool, then continue once or twice daily to maintain regular soft stool. 09/15/21   Tiffany Kocher, PA-C     Allergies    Patient has no known allergies.   Review of Systems   Review of Systems Please see HPI for pertinent positives and negatives  Physical Exam BP (!) 156/77 (BP Location: Left Arm)   Pulse 92   Temp 97.7 F (36.5 C) (Oral)   Resp 18   Ht 5\' 2"  (1.575 m)   Wt 131 kg   SpO2 96%   BMI 52.82 kg/m   Physical Exam Vitals and nursing note reviewed.  Constitutional:      Appearance: Normal appearance.  HENT:     Head:  Normocephalic and atraumatic.     Nose: Nose normal.     Mouth/Throat:     Mouth: Mucous membranes are moist.  Eyes:     Extraocular Movements: Extraocular movements intact.     Conjunctiva/sclera: Conjunctivae normal.  Cardiovascular:     Rate and Rhythm: Tachycardia present.  Pulmonary:     Effort: Pulmonary effort is normal.     Breath sounds: Wheezing present.  Abdominal:     General: Abdomen is flat.     Palpations: Abdomen is soft.     Tenderness: There is no abdominal tenderness.  Musculoskeletal:        General: No swelling. Normal range of motion.     Cervical back: Neck supple.     Right lower leg: Edema present.     Left lower leg: Edema present.  Skin:    General: Skin is warm and dry.  Neurological:     General: No focal deficit present.     Mental Status: She is alert.  Psychiatric:        Mood and Affect: Mood normal.     ED Results / Procedures / Treatments   EKG EKG Interpretation  Date/Time:  Saturday March 27 2022 23:32:20 EST Ventricular Rate:  101 PR Interval:  166 QRS Duration: 87 QT Interval:  342 QTC Calculation: 444 R Axis:  65 Text Interpretation: Sinus tachycardia Low voltage, precordial leads Borderline T abnormalities, diffuse leads No significant change since last tracing Confirmed by Susy Frizzle [phone]) on 03/27/2022 11:38:52 PM  Procedures .Critical Care  Performed by: Pollyann Savoy, MD Authorized by: Pollyann Savoy, MD   Critical care provider statement:    Critical care time (minutes):  30   Critical care time was exclusive of:  Separately billable procedures and treating other patients   Critical care was necessary to treat or prevent imminent or life-threatening deterioration of the following conditions:  Respiratory failure   Critical care was time spent personally by me on the following activities:  Development of treatment plan with patient or surrogate, discussions with consultants, evaluation of patient's  response to treatment, examination of patient, ordering and review of laboratory studies, ordering and review of radiographic studies, ordering and performing treatments and interventions, pulse oximetry, re-evaluation of patient's condition and review of old charts   Medications Ordered in the ED Medications  amLODipine (NORVASC) tablet 10 mg (has no administration in time range)  hydrochlorothiazide (HYDRODIURIL) tablet 25 mg (has no administration in time range)  polyethylene glycol (MIRALAX / GLYCOLAX) packet 17 g (has no administration in time range)  methylPREDNISolone sodium succinate (SOLU-MEDROL) 125 mg/2 mL injection 125 mg (has no administration in time range)    Followed by  predniSONE (DELTASONE) tablet 40 mg (has no administration in time range)  cefTRIAXone (ROCEPHIN) 2 g in sodium chloride 0.9 % 100 mL IVPB (has no administration in time range)  azithromycin (ZITHROMAX) 500 mg in sodium chloride 0.9 % 250 mL IVPB (has no administration in time range)  ipratropium-albuterol (DUONEB) 0.5-2.5 (3) MG/3ML nebulizer solution 3 mL (has no administration in time range)  albuterol (PROVENTIL) (2.5 MG/3ML) 0.083% nebulizer solution 2.5 mg (has no administration in time range)  heparin injection 5,000 Units (has no administration in time range)  acetaminophen (TYLENOL) tablet 650 mg (has no administration in time range)    Or  acetaminophen (TYLENOL) suppository 650 mg (has no administration in time range)  oxyCODONE (Oxy IR/ROXICODONE) immediate release tablet 5 mg (has no administration in time range)  morphine (PF) 2 MG/ML injection 2 mg (has no administration in time range)  ondansetron (ZOFRAN) tablet 4 mg (has no administration in time range)    Or  ondansetron (ZOFRAN) injection 4 mg (has no administration in time range)  influenza vac split quadrivalent PF (FLUARIX) injection 0.5 mL (has no administration in time range)  ipratropium-albuterol (DUONEB) 0.5-2.5 (3) MG/3ML  nebulizer solution 3 mL (3 mLs Nebulization Given 03/28/22 0009)  methylPREDNISolone sodium succinate (SOLU-MEDROL) 125 mg/2 mL injection 125 mg (125 mg Intravenous Given 03/28/22 0021)  albuterol (PROVENTIL) (2.5 MG/3ML) 0.083% nebulizer solution (2.5 mg  Given 03/28/22 0009)  cefTRIAXone (ROCEPHIN) 1 g in sodium chloride 0.9 % 100 mL IVPB (0 g Intravenous Stopped 03/28/22 0106)  azithromycin (ZITHROMAX) 500 mg in sodium chloride 0.9 % 250 mL IVPB (500 mg Intravenous New Bag/Given 03/28/22 0106)  potassium chloride (KLOR-CON) packet 40 mEq (40 mEq Oral Given 03/28/22 0318)    Initial Impression and Plan  Patient here with about a week of increasing respiratory symptoms, noted to be wheezing, some hypoxia on my evaluation with SpO2 down to 86% on RA while getting her CXR. Placed on 2 L [place] oxygen with improvement. Will give nebs, steroids check labs and reassess for improvement.   ED Course   Clinical Course as of 03/28/22 0430  Wynelle Link Mar 28, 2022  0007 I personally viewed the images from radiology studies and agree with radiologist interpretation: CXR with cardiomegaly and likely a basilar infiltrate. Will start Abx for CAP.   [CS]  0021 CBC with normal WBC and mild anemia.  [CS]  0105 Covid is neg. Trop/BNP are normal. BMP is unremarkable. Given xray findings and oxygen requirement, will plan admission for further management.  [CS]  0204 Spoke with Dr. Mitchell Heir, Hospitalist, who will evaluate for admission. [CS]    Clinical Course User Index [CS] Pollyann Savoy, MD     MDM Rules/Calculators/A&P Medical Decision Making Problems Addressed: Acute respiratory failure with hypoxia [facility]): acute illness or injury Community acquired pneumonia of left lower lobe of lung: acute illness or injury  Amount and/or Complexity of Data Reviewed Labs: ordered. Decision-making details documented in ED Course. Radiology: ordered and independent interpretation performed. Decision-making  details documented in ED Course. ECG/medicine tests: ordered and independent interpretation performed. Decision-making details documented in ED Course.  Risk Prescription drug management. Decision regarding hospitalization.    Final Clinical Impression(s) / ED Diagnoses Final diagnoses:  Community acquired pneumonia of left lower lobe of lung  Acute respiratory failure with hypoxia [facility])    Rx / DC Orders ED Discharge Orders     None        Pollyann Savoy, MD 03/28/22 0430

## 2022-03-28 ENCOUNTER — Inpatient Hospital Stay (HOSPITAL_COMMUNITY): Payer: Medicare Other

## 2022-03-28 DIAGNOSIS — I10 Essential (primary) hypertension: Secondary | ICD-10-CM | POA: Diagnosis not present

## 2022-03-28 DIAGNOSIS — J45901 Unspecified asthma with (acute) exacerbation: Secondary | ICD-10-CM

## 2022-03-28 DIAGNOSIS — J9601 Acute respiratory failure with hypoxia: Secondary | ICD-10-CM | POA: Diagnosis present

## 2022-03-28 DIAGNOSIS — I517 Cardiomegaly: Secondary | ICD-10-CM | POA: Diagnosis not present

## 2022-03-28 DIAGNOSIS — J189 Pneumonia, unspecified organism: Secondary | ICD-10-CM | POA: Insufficient documentation

## 2022-03-28 DIAGNOSIS — J441 Chronic obstructive pulmonary disease with (acute) exacerbation: Secondary | ICD-10-CM | POA: Diagnosis not present

## 2022-03-28 DIAGNOSIS — E876 Hypokalemia: Secondary | ICD-10-CM | POA: Insufficient documentation

## 2022-03-28 DIAGNOSIS — R600 Localized edema: Secondary | ICD-10-CM | POA: Diagnosis present

## 2022-03-28 DIAGNOSIS — Z20822 Contact with and (suspected) exposure to covid-19: Secondary | ICD-10-CM | POA: Diagnosis present

## 2022-03-28 DIAGNOSIS — R0609 Other forms of dyspnea: Secondary | ICD-10-CM | POA: Diagnosis not present

## 2022-03-28 DIAGNOSIS — Z6841 Body Mass Index (BMI) 40.0 and over, adult: Secondary | ICD-10-CM | POA: Diagnosis not present

## 2022-03-28 DIAGNOSIS — Z79899 Other long term (current) drug therapy: Secondary | ICD-10-CM | POA: Diagnosis not present

## 2022-03-28 DIAGNOSIS — E669 Obesity, unspecified: Secondary | ICD-10-CM | POA: Diagnosis present

## 2022-03-28 DIAGNOSIS — Z23 Encounter for immunization: Secondary | ICD-10-CM | POA: Diagnosis not present

## 2022-03-28 DIAGNOSIS — E538 Deficiency of other specified B group vitamins: Secondary | ICD-10-CM | POA: Diagnosis present

## 2022-03-28 DIAGNOSIS — I119 Hypertensive heart disease without heart failure: Secondary | ICD-10-CM | POA: Diagnosis present

## 2022-03-28 LAB — CBC WITH DIFFERENTIAL/PLATELET
Abs Immature Granulocytes: 0.08 10*3/uL — ABNORMAL HIGH (ref 0.00–0.07)
Abs Immature Granulocytes: 0.17 10*3/uL — ABNORMAL HIGH (ref 0.00–0.07)
Basophils Absolute: 0 10*3/uL (ref 0.0–0.1)
Basophils Absolute: 0 10*3/uL (ref 0.0–0.1)
Basophils Relative: 0 %
Basophils Relative: 0 %
Eosinophils Absolute: 0 10*3/uL (ref 0.0–0.5)
Eosinophils Absolute: 0.1 10*3/uL (ref 0.0–0.5)
Eosinophils Relative: 0 %
Eosinophils Relative: 2 %
HCT: 35.6 % — ABNORMAL LOW (ref 36.0–46.0)
HCT: 36.4 % (ref 36.0–46.0)
Hemoglobin: 10.5 g/dL — ABNORMAL LOW (ref 12.0–15.0)
Hemoglobin: 10.7 g/dL — ABNORMAL LOW (ref 12.0–15.0)
Immature Granulocytes: 1 %
Immature Granulocytes: 2 %
Lymphocytes Relative: 13 %
Lymphocytes Relative: 9 %
Lymphs Abs: 0.7 10*3/uL (ref 0.7–4.0)
Lymphs Abs: 0.9 10*3/uL (ref 0.7–4.0)
MCH: 24.3 pg — ABNORMAL LOW (ref 26.0–34.0)
MCH: 24.5 pg — ABNORMAL LOW (ref 26.0–34.0)
MCHC: 29.4 g/dL — ABNORMAL LOW (ref 30.0–36.0)
MCHC: 29.5 g/dL — ABNORMAL LOW (ref 30.0–36.0)
MCV: 82.7 fL (ref 80.0–100.0)
MCV: 83.2 fL (ref 80.0–100.0)
Monocytes Absolute: 0.1 10*3/uL (ref 0.1–1.0)
Monocytes Absolute: 0.3 10*3/uL (ref 0.1–1.0)
Monocytes Relative: 1 %
Monocytes Relative: 5 %
Neutro Abs: 5.6 10*3/uL (ref 1.7–7.7)
Neutro Abs: 6.3 10*3/uL (ref 1.7–7.7)
Neutrophils Relative %: 79 %
Neutrophils Relative %: 88 %
Platelets: 308 10*3/uL (ref 150–400)
Platelets: 315 10*3/uL (ref 150–400)
RBC: 4.28 MIL/uL (ref 3.87–5.11)
RBC: 4.4 MIL/uL (ref 3.87–5.11)
RDW: 17.2 % — ABNORMAL HIGH (ref 11.5–15.5)
RDW: 17.3 % — ABNORMAL HIGH (ref 11.5–15.5)
WBC: 7 10*3/uL (ref 4.0–10.5)
WBC: 7.2 10*3/uL (ref 4.0–10.5)
nRBC: 0 % (ref 0.0–0.2)
nRBC: 0 % (ref 0.0–0.2)

## 2022-03-28 LAB — COMPREHENSIVE METABOLIC PANEL
ALT: 11 U/L (ref 0–44)
AST: 16 U/L (ref 15–41)
Albumin: 3.2 g/dL — ABNORMAL LOW (ref 3.5–5.0)
Alkaline Phosphatase: 47 U/L (ref 38–126)
Anion gap: 4 — ABNORMAL LOW (ref 5–15)
BUN: 16 mg/dL (ref 6–20)
CO2: 31 mmol/L (ref 22–32)
Calcium: 8.2 mg/dL — ABNORMAL LOW (ref 8.9–10.3)
Chloride: 103 mmol/L (ref 98–111)
Creatinine, Ser: 0.58 mg/dL (ref 0.44–1.00)
GFR, Estimated: >60 mL/min (ref >60)
Glucose, Bld: 173 mg/dL — ABNORMAL HIGH (ref 70–99)
Potassium: 3.8 mmol/L (ref 3.5–5.1)
Sodium: 138 mmol/L (ref 135–145)
Total Bilirubin: 0.4 mg/dL (ref 0.3–1.2)
Total Protein: 9 g/dL — ABNORMAL HIGH (ref 6.5–8.1)

## 2022-03-28 LAB — BASIC METABOLIC PANEL
Anion gap: 4 — ABNORMAL LOW (ref 5–15)
BUN: 16 mg/dL (ref 6–20)
CO2: 35 mmol/L — ABNORMAL HIGH (ref 22–32)
Calcium: 8.5 mg/dL — ABNORMAL LOW (ref 8.9–10.3)
Chloride: 100 mmol/L (ref 98–111)
Creatinine, Ser: 0.78 mg/dL (ref 0.44–1.00)
GFR, Estimated: >60 mL/min (ref >60)
Glucose, Bld: 112 mg/dL — ABNORMAL HIGH (ref 70–99)
Potassium: 3.3 mmol/L — ABNORMAL LOW (ref 3.5–5.1)
Sodium: 139 mmol/L (ref 135–145)

## 2022-03-28 LAB — RETICULOCYTES
Immature Retic Fract: 29 % — ABNORMAL HIGH (ref 2.3–15.9)
RBC.: 4.26 MIL/uL (ref 3.87–5.11)
Retic Count, Absolute: 57.5 10*3/uL (ref 19.0–186.0)
Retic Ct Pct: 1.4 % (ref 0.4–3.1)

## 2022-03-28 LAB — ECHOCARDIOGRAM COMPLETE
Area-P 1/2: 4.96 cm2
Height: 62 in
S' Lateral: 3 cm
Weight: 4620.84 oz

## 2022-03-28 LAB — IRON AND TIBC
Iron: 33 ug/dL (ref 28–170)
Saturation Ratios: 14 % (ref 10.4–31.8)
TIBC: 243 ug/dL — ABNORMAL LOW (ref 250–450)
UIBC: 210 ug/dL

## 2022-03-28 LAB — TROPONIN I (HIGH SENSITIVITY)
Troponin I (High Sensitivity): 7 ng/L (ref <18)
Troponin I (High Sensitivity): 8 ng/L (ref <18)

## 2022-03-28 LAB — RESP PANEL BY RT-PCR (FLU A&B, COVID) ARPGX2
Influenza A by PCR: NEGATIVE
Influenza B by PCR: NEGATIVE
SARS Coronavirus 2 by RT PCR: NEGATIVE

## 2022-03-28 LAB — BRAIN NATRIURETIC PEPTIDE: B Natriuretic Peptide: 31 pg/mL (ref 0.0–100.0)

## 2022-03-28 LAB — MAGNESIUM: Magnesium: 2.3 mg/dL (ref 1.7–2.4)

## 2022-03-28 LAB — FOLATE: Folate: 5.3 ng/mL — ABNORMAL LOW (ref >5.9)

## 2022-03-28 LAB — VITAMIN B12: Vitamin B-12: 270 pg/mL (ref 180–914)

## 2022-03-28 LAB — FERRITIN: Ferritin: 93 ng/mL (ref 11–307)

## 2022-03-28 MED ORDER — HEPARIN SODIUM (PORCINE) 5000 UNIT/ML IJ SOLN
5000.0000 [IU] | Freq: Three times a day (TID) | INTRAMUSCULAR | Status: DC
  Administered 2022-03-28 – 2022-03-29 (×4): 5000 [IU] via SUBCUTANEOUS
  Filled 2022-03-28 (×4): qty 1

## 2022-03-28 MED ORDER — POTASSIUM CHLORIDE 20 MEQ PO PACK
40.0000 meq | PACK | Freq: Every day | ORAL | Status: DC
  Administered 2022-03-28 – 2022-03-29 (×2): 40 meq via ORAL
  Filled 2022-03-28 (×2): qty 2

## 2022-03-28 MED ORDER — INFLUENZA VAC SPLIT QUAD 0.5 ML IM SUSY
0.5000 mL | PREFILLED_SYRINGE | INTRAMUSCULAR | Status: AC
  Administered 2022-03-29: 0.5 mL via INTRAMUSCULAR
  Filled 2022-03-28: qty 0.5

## 2022-03-28 MED ORDER — SODIUM CHLORIDE 0.9 % IV SOLN
500.0000 mg | INTRAVENOUS | Status: DC
  Administered 2022-03-29: 500 mg via INTRAVENOUS
  Filled 2022-03-28: qty 5

## 2022-03-28 MED ORDER — ALBUTEROL SULFATE (2.5 MG/3ML) 0.083% IN NEBU: 2.5000 mg | INHALATION_SOLUTION | RESPIRATORY_TRACT | Status: DC | PRN

## 2022-03-28 MED ORDER — ACETAMINOPHEN 325 MG PO TABS
650.0000 mg | ORAL_TABLET | Freq: Four times a day (QID) | ORAL | Status: DC | PRN
  Administered 2022-03-28: 650 mg via ORAL
  Filled 2022-03-28: qty 2

## 2022-03-28 MED ORDER — ONDANSETRON HCL 4 MG/2ML IJ SOLN: 4.0000 mg | Freq: Four times a day (QID) | INTRAMUSCULAR | Status: DC | PRN

## 2022-03-28 MED ORDER — SODIUM CHLORIDE 0.9 % IV SOLN
2.0000 g | INTRAVENOUS | Status: DC
  Administered 2022-03-28: 2 g via INTRAVENOUS
  Filled 2022-03-28: qty 20

## 2022-03-28 MED ORDER — PREDNISONE 20 MG PO TABS: 40.0000 mg | ORAL_TABLET | Freq: Every day | ORAL | Status: DC

## 2022-03-28 MED ORDER — POLYETHYLENE GLYCOL 3350 17 G PO PACK: 17.0000 g | PACK | Freq: Every day | ORAL | Status: DC | PRN

## 2022-03-28 MED ORDER — AMLODIPINE BESYLATE 5 MG PO TABS
10.0000 mg | ORAL_TABLET | Freq: Every day | ORAL | Status: DC
  Administered 2022-03-28 – 2022-03-29 (×2): 10 mg via ORAL
  Filled 2022-03-28 (×2): qty 2

## 2022-03-28 MED ORDER — ALBUTEROL SULFATE (2.5 MG/3ML) 0.083% IN NEBU
INHALATION_SOLUTION | RESPIRATORY_TRACT | Status: AC
  Administered 2022-03-28: 2.5 mg
  Filled 2022-03-28: qty 3

## 2022-03-28 MED ORDER — OXYCODONE HCL 5 MG PO TABS: 5.0000 mg | ORAL_TABLET | ORAL | Status: DC | PRN

## 2022-03-28 MED ORDER — PERFLUTREN LIPID MICROSPHERE
1.0000 mL | INTRAVENOUS | Status: AC | PRN
  Administered 2022-03-28: 2 mL via INTRAVENOUS

## 2022-03-28 MED ORDER — METHYLPREDNISOLONE SODIUM SUCC 40 MG IJ SOLR
40.0000 mg | Freq: Two times a day (BID) | INTRAMUSCULAR | Status: AC
  Administered 2022-03-28 (×2): 40 mg via INTRAVENOUS
  Filled 2022-03-28 (×2): qty 1

## 2022-03-28 MED ORDER — MORPHINE SULFATE (PF) 2 MG/ML IV SOLN: 1.0000 mg | INTRAVENOUS | Status: DC | PRN

## 2022-03-28 MED ORDER — SODIUM CHLORIDE 0.9 % IV SOLN
1.0000 g | Freq: Once | INTRAVENOUS | Status: AC
  Administered 2022-03-28: 1 g via INTRAVENOUS
  Filled 2022-03-28: qty 10

## 2022-03-28 MED ORDER — ACETAMINOPHEN 650 MG RE SUPP: 650.0000 mg | Freq: Four times a day (QID) | RECTAL | Status: DC | PRN

## 2022-03-28 MED ORDER — IPRATROPIUM-ALBUTEROL 0.5-2.5 (3) MG/3ML IN SOLN
3.0000 mL | Freq: Four times a day (QID) | RESPIRATORY_TRACT | Status: DC
  Administered 2022-03-28 – 2022-03-29 (×6): 3 mL via RESPIRATORY_TRACT
  Filled 2022-03-28 (×6): qty 3

## 2022-03-28 MED ORDER — SODIUM CHLORIDE 0.9 % IV SOLN
500.0000 mg | Freq: Once | INTRAVENOUS | Status: AC
  Administered 2022-03-28: 500 mg via INTRAVENOUS
  Filled 2022-03-28: qty 5

## 2022-03-28 MED ORDER — METHYLPREDNISOLONE SODIUM SUCC 125 MG IJ SOLR: 125.0000 mg | Freq: Two times a day (BID) | INTRAMUSCULAR | Status: DC

## 2022-03-28 MED ORDER — MORPHINE SULFATE (PF) 2 MG/ML IV SOLN: 2.0000 mg | INTRAVENOUS | Status: DC | PRN

## 2022-03-28 MED ORDER — HYDROCHLOROTHIAZIDE 25 MG PO TABS
25.0000 mg | ORAL_TABLET | Freq: Every day | ORAL | Status: DC
  Administered 2022-03-28 – 2022-03-29 (×2): 25 mg via ORAL
  Filled 2022-03-28 (×2): qty 1

## 2022-03-28 MED ORDER — ONDANSETRON HCL 4 MG PO TABS: 4.0000 mg | ORAL_TABLET | Freq: Four times a day (QID) | ORAL | Status: DC | PRN

## 2022-03-28 MED ORDER — POTASSIUM CHLORIDE 20 MEQ PO PACK
40.0000 meq | PACK | Freq: Once | ORAL | Status: AC
  Administered 2022-03-28: 40 meq via ORAL
  Filled 2022-03-28: qty 2

## 2022-03-28 NOTE — Assessment & Plan Note (Signed)
-  Potassium 3.3 -replace and recheck -monitor on tele

## 2022-03-28 NOTE — Hospital Course (Signed)
61 y.o. female with medical history significant of anemia, asthma, hypertension, obesity, and more presents the ED with a chief complaint of "not feeling well."  Patient reports that she has had some congestion and rattling in her chest.  It started 1 week ago, but became acutely worse 2 days ago.  Patient denies any dyspnea.  She reports that she is not compliant with her inhalers.  She has had no chest pain and no palpitations.  Patient does report a productive cough with yellow sputum.  She also reports fatigue and malaise.  She denies fever.  Patient wears no oxygen at home.  Her appetite has been good.  Patient reports its been decades since she has had an asthma flare.  She has never been intubated for asthma.  Patient had associated nausea and vomiting x1 episode 2 days ago.  The emesis was nonbloody.  Patient reports she feels much better since having breathing treatments in the ED.  Patient denies abdominal pain, diarrhea, dysuria.  Patient has no other complaints at this time.

## 2022-03-28 NOTE — Assessment & Plan Note (Signed)
-  O2 sats down to 86% on RA -Maintaining O2 sats on 2L  -Asthma exacerbation triggered by CAP seen on CXR -Continue scheduled duoNeb and PRN albuterol -Continue steroid -Treat pneumonia per CAP plan -Wean off O2 as tolerated -Continue to monitor

## 2022-03-28 NOTE — H&P (Signed)
History and Physical    Patient: Emma Morris FBP:[id number] DOB: Aug 17, 1960 DOA: 03/27/2022 DOS: the patient was seen and examined on 03/28/2022 PCP: Alvina Filbert, MD  Patient coming from: Home  Chief Complaint:  Chief Complaint  Patient presents with   Cough   HPI: Emma Morris is a 61 y.o. female with medical history significant of anemia, asthma, hypertension, obesity, and more presents the ED with a chief complaint of "not feeling well."  Patient reports that she has had some congestion and rattling in her chest.  It started 1 week ago, but became acutely worse 2 days ago.  Patient denies any dyspnea.  She reports that she is not compliant with her inhalers.  She has had no chest pain and no palpitations.  Patient does report a productive cough with yellow sputum.  She also reports fatigue and malaise.  She denies fever.  Patient wears no oxygen at home.  Her appetite has been good.  Patient reports its been decades since she has had an asthma flare.  She has never been intubated for asthma.  Patient had associated nausea and vomiting x1 episode 2 days ago.  The emesis was nonbloody.  Patient reports she feels much better since having breathing treatments in the ED.  Patient denies abdominal pain, diarrhea, dysuria.  Patient has no other complaints at this time.  Patient does not smoke, does not drink, does not use illicit drugs.  She is vaccinated for COVID.  Patient is full code. Review of Systems: As mentioned in the history of present illness. All other systems reviewed and are negative. Past Medical History:  Diagnosis Date   Anemia    Arthritis    Asthma    Hypertension    Knee pain    Obesity    Past Surgical History:  Procedure Laterality Date   CESAREAN SECTION     COLONOSCOPY WITH PROPOFOL N/A 11/09/2021   Procedure: COLONOSCOPY WITH PROPOFOL;  Surgeon: Lanelle Bal, DO;  Location: [facility];  Service: Endoscopy;  Laterality: N/A;  11:00am    DILITATION & CURRETTAGE/HYSTROSCOPY WITH THERMACHOICE ABLATION N/A 05/04/2013   Procedure: DILATATION & CURETTAGE/HYSTEROSCOPY WITH THERMACHOICE ABLATION;  Surgeon: Lazaro Arms, MD;  Location: [facility] ORS;  Service: Gynecology;  Laterality: N/A;  temperature:87 degrees; total therapy time:14 minutes and 5 seconds   ESOPHAGOGASTRODUODENOSCOPY (EGD) WITH PROPOFOL N/A 11/09/2021   Procedure: ESOPHAGOGASTRODUODENOSCOPY (EGD) WITH PROPOFOL;  Surgeon: Lanelle Bal, DO;  Location: [facility];  Service: Endoscopy;  Laterality: N/A;   TONSILLECTOMY     Social History:  reports that she has never smoked. She has never used smokeless tobacco. She reports that she does not drink alcohol and does not use drugs.  No Known Allergies  Family History  Problem Relation Age of Onset   Cancer Mother    Kidney disease Father    Diabetes Father    Colon cancer Neg Hx     Prior to Admission medications   Medication Sig Start Date End Date Taking? Authorizing Provider  acetaminophen (TYLENOL) 500 MG tablet Take 1,000 mg by mouth every 8 (eight) hours as needed for moderate pain.    [provider]  albuterol (PROVENTIL HFA;VENTOLIN HFA) 108 (90 Base) MCG/ACT inhaler Inhale 2 puffs into the lungs every 6 (six) hours as needed for wheezing or shortness of breath.    [provider]  amLODipine (NORVASC) 10 MG tablet Take 10 mg by mouth daily. 05/17/19   [provider]  fluticasone (FLONASE) 50 MCG/ACT nasal spray Place into both nostrils. 01/27/21   [provider]  hydrochlorothiazide (HYDRODIURIL) 25 MG tablet Take 25 mg by mouth daily.    [provider]  polyethylene glycol (MIRALAX) 17 g packet Take 17 g by mouth 2 (two) times daily. Until soft stool, then continue once or twice daily to maintain regular soft stool. 09/15/21   Tiffany Kocher, PA-C    Physical Exam: Vitals:   03/28/22 0100 03/28/22 0130 03/28/22 0241 03/28/22 0300  BP: (!) 126/91 (!) 148/100  (!) 156/77   Pulse: 82 89 92   Resp: 16 20 18    Temp:  98.4 F (36.9 C) 97.7 F (36.5 C)   TempSrc:  Oral Oral   SpO2: 100% 99% 96%   Weight:    131 kg  Height:    5\' 2"  (1.575 m)   1.  General: Patient lying supine in bed,  no acute distress   2. Psychiatric: Alert and oriented x 3, mood and behavior normal for situation, pleasant and cooperative with exam   3. Neurologic: Speech and language are normal, face is symmetric, moves all 4 extremities voluntarily, at baseline without acute deficits on limited exam   4. HEENMT:  Head is atraumatic, normocephalic, pupils reactive to light, neck is supple, trachea is midline, mucous membranes are moist   5. Respiratory : Lungs are clear to auscultation bilaterally without wheezing, rhonchi, rales, no cyanosis, no increase in work of breathing or accessory muscle use, speaking in full sentences, maintaining oxygen saturations on 2 L nasal cannula   6. Cardiovascular : Heart rate normal, rhythm is regular, no murmurs, rubs or gallops, peripheral edema present, peripheral pulses palpated   7. Gastrointestinal:  Abdomen is soft, nondistended, nontender to palpation bowel sounds active, no masses or organomegaly palpated   8. Skin:  Skin is warm, dry and intact without rashes, acute lesions, or ulcers on limited exam   9.Musculoskeletal:  No acute deformities or trauma, no asymmetry in tone, peripheral edema present, peripheral pulses palpated, no tenderness to palpation in the extremities  Data Reviewed: In the ED Temp 98.6, heart rate 109, respiratory rate 20, blood pressure 149/79, satting 86% on room air, improved to 93% on 2 L nasal cannula No leukocytosis with a white blood cell count of 7.0, hemoglobin 7.7 Chemistry reveals a hypokalemia at 3.3 BNP 31, trop 7 Neg covid and flu CXR - L basilar opacity likely infiltrate Zithromax, Rocephin, DuoNeb, Solumedrol Admission requested for CAP and asthma exacerbation  Assessment  and Plan: * Acute respiratory failure with hypoxia (HCC) -O2 sats down to 86% on RA -Maintaining O2 sats on 2L [place] -Asthma exacerbation triggered by CAP seen on CXR -Continue scheduled duoNeb and PRN albuterol -Continue steroid -Treat pneumonia per CAP plan -Wean off O2 as tolerated -Continue to monitor   CAP (community acquired pneumonia) -Continue Rocephin and Zithromax -Culture sputum -Strep and legionella urine antigens -Neg covid and flu -CXR shows L basilar opacity  -Continue to monitor  Asthma, chronic obstructive, with acute exacerbation (HCC) -Wheezing -with dyspnea and hypoxia -Continue scheduled duoneb, PRN albuterol -Continue steroid -Continue to monitor  HTN (hypertension) -Continue norvasc and HCTZ  Hypokalemia -Potassium 3.3 -replace and recheck -monitor on tele      Advance Care Planning:   Code Status: Full Code   Consults: none  Family Communication: no family at bedside  Severity of Illness: The appropriate patient status for this patient is OBSERVATION. Observation status is judged to be  reasonable and necessary in order to provide the required intensity of service to ensure the patient's safety. The patient's presenting symptoms, physical exam findings, and initial radiographic and laboratory data in the context of their medical condition is felt to place them at decreased risk for further clinical deterioration. Furthermore, it is anticipated that the patient will be medically stable for discharge from the hospital within 2 midnights of admission.   Author: Lilyan Gilford, DO 03/28/2022 4:44 AM  For on call review [email or web address].

## 2022-03-28 NOTE — Assessment & Plan Note (Signed)
-  Continue norvasc and HCTZ

## 2022-03-28 NOTE — TOC Progression Note (Signed)
  Transition of Care [facility]) Screening Note   Patient Details  Name: Emma Morris Date of Birth: 01-18-1961   Transition of Care [facility]) CM/SW Contact:    Leitha Bleak, RN Phone Number: 03/28/2022, 4:22 PM    Transition of Care Department [facility]) has reviewed patient and no TOC needs have been identified at this time. We will continue to monitor patient advancement through interdisciplinary progression rounds. If new patient transition needs arise, please place a TOC consult.       Barriers to Discharge: Continued Medical Work up

## 2022-03-28 NOTE — Assessment & Plan Note (Signed)
-  Continue Rocephin and Zithromax -Culture sputum -Strep and legionella urine antigens -Neg covid and flu -CXR shows L basilar opacity  -Continue to monitor

## 2022-03-28 NOTE — Progress Notes (Signed)
Echocardiogram 2D Echocardiogram has been performed.  [name] 03/28/2022, 3:55 PM

## 2022-03-28 NOTE — Progress Notes (Signed)
ASSUMPTION OF CARE NOTE   03/28/2022 11:27 AM  Emma Morris was seen and examined.  The H&P by the admitting provider, orders, imaging was reviewed.  Please see new orders.  Will continue to follow.  With finding of cardiomegaly on CXR and symptoms of dyspnea will add 2D Echocardiogram to work up.   61 y.o. female with medical history significant of anemia, asthma, hypertension, obesity, and more presents the ED with a chief complaint of "not feeling well."  Patient reports that she has had some congestion and rattling in her chest.  It started 1 week ago, but became acutely worse 2 days ago.  Patient denies any dyspnea.  She reports that she is not compliant with her inhalers.  She has had no chest pain and no palpitations.  Patient does report a productive cough with yellow sputum.  She also reports fatigue and malaise.  She denies fever.  Patient wears no oxygen at home.  Her appetite has been good.  Patient reports its been decades since she has had an asthma flare.  She has never been intubated for asthma.  Patient had associated nausea and vomiting x1 episode 2 days ago.  The emesis was nonbloody.  Patient reports she feels much better since having breathing treatments in the ED.  Patient denies abdominal pain, diarrhea, dysuria.  Patient has no other complaints at this time.     Vitals:   03/28/22 0852 03/28/22 1016  BP:  119/82  Pulse:  88  Resp:  20  Temp:  98.5 F (36.9 C)  SpO2: 90% 96%    Results for orders placed or performed during the hospital encounter of 03/27/22  Resp Panel by RT-PCR (Flu A&B, Covid) Anterior Nasal Swab   Specimen: Anterior Nasal Swab  Result Value Ref Range   SARS Coronavirus 2 by RT PCR NEGATIVE NEGATIVE   Influenza A by PCR NEGATIVE NEGATIVE   Influenza B by PCR NEGATIVE NEGATIVE  Basic metabolic panel  Result Value Ref Range   Sodium 139 135 - 145 mmol/L   Potassium 3.3 (L) 3.5 - 5.1 mmol/L   Chloride 100 98 - 111 mmol/L   CO2 35 (H) 22 -  32 mmol/L   Glucose, Bld 112 (H) 70 - 99 mg/dL   BUN 16 6 - 20 mg/dL   Creatinine, Ser 2.69 0.44 - 1.00 mg/dL   Calcium 8.5 (L) 8.9 - 10.3 mg/dL   GFR, Estimated >48 >54 mL/min   Anion gap 4 (L) 5 - 15  Brain natriuretic peptide  Result Value Ref Range   B Natriuretic Peptide 31.0 0.0 - 100.0 pg/mL  CBC with Differential  Result Value Ref Range   WBC 7.0 4.0 - 10.5 K/uL   RBC 4.40 3.87 - 5.11 MIL/uL   Hemoglobin 10.7 (L) 12.0 - 15.0 g/dL   HCT 62.7 03.5 - 00.9 %   MCV 82.7 80.0 - 100.0 fL   MCH 24.3 (L) 26.0 - 34.0 pg   MCHC 29.4 (L) 30.0 - 36.0 g/dL   RDW 38.1 (H) 82.9 - 93.7 %   Platelets 308 150 - 400 K/uL   nRBC 0.0 0.0 - 0.2 %   Neutrophils Relative % 79 %   Neutro Abs 5.6 1.7 - 7.7 K/uL   Lymphocytes Relative 13 %   Lymphs Abs 0.9 0.7 - 4.0 K/uL   Monocytes Relative 5 %   Monocytes Absolute 0.3 0.1 - 1.0 K/uL   Eosinophils Relative 2 %   Eosinophils Absolute 0.1  0.0 - 0.5 K/uL   Basophils Relative 0 %   Basophils Absolute 0.0 0.0 - 0.1 K/uL   Immature Granulocytes 1 %   Abs Immature Granulocytes 0.08 (H) 0.00 - 0.07 K/uL  Comprehensive metabolic panel  Result Value Ref Range   Sodium 138 135 - 145 mmol/L   Potassium 3.8 3.5 - 5.1 mmol/L   Chloride 103 98 - 111 mmol/L   CO2 31 22 - 32 mmol/L   Glucose, Bld 173 (H) 70 - 99 mg/dL   BUN 16 6 - 20 mg/dL   Creatinine, Ser 7.74 0.44 - 1.00 mg/dL   Calcium 8.2 (L) 8.9 - 10.3 mg/dL   Total Protein 9.0 (H) 6.5 - 8.1 g/dL   Albumin 3.2 (L) 3.5 - 5.0 g/dL   AST 16 15 - 41 U/L   ALT 11 0 - 44 U/L   Alkaline Phosphatase 47 38 - 126 U/L   Total Bilirubin 0.4 0.3 - 1.2 mg/dL   GFR, Estimated >12 >87 mL/min   Anion gap 4 (L) 5 - 15  Magnesium  Result Value Ref Range   Magnesium 2.3 1.7 - 2.4 mg/dL  CBC with Differential/Platelet  Result Value Ref Range   WBC 7.2 4.0 - 10.5 K/uL   RBC 4.28 3.87 - 5.11 MIL/uL   Hemoglobin 10.5 (L) 12.0 - 15.0 g/dL   HCT 86.7 (L) 67.2 - 09.4 %   MCV 83.2 80.0 - 100.0 fL   MCH 24.5 (L)  26.0 - 34.0 pg   MCHC 29.5 (L) 30.0 - 36.0 g/dL   RDW 70.9 (H) 62.8 - 36.6 %   Platelets 315 150 - 400 K/uL   nRBC 0.0 0.0 - 0.2 %   Neutrophils Relative % 88 %   Neutro Abs 6.3 1.7 - 7.7 K/uL   Lymphocytes Relative 9 %   Lymphs Abs 0.7 0.7 - 4.0 K/uL   Monocytes Relative 1 %   Monocytes Absolute 0.1 0.1 - 1.0 K/uL   Eosinophils Relative 0 %   Eosinophils Absolute 0.0 0.0 - 0.5 K/uL   Basophils Relative 0 %   Basophils Absolute 0.0 0.0 - 0.1 K/uL   Immature Granulocytes 2 %   Abs Immature Granulocytes 0.17 (H) 0.00 - 0.07 K/uL  Vitamin B12  Result Value Ref Range   Vitamin B-12 270 180 - 914 pg/mL  Folate  Result Value Ref Range   Folate 5.3 (L) >5.9 ng/mL  Iron and TIBC  Result Value Ref Range   Iron 33 28 - 170 ug/dL   TIBC 294 (L) 765 - 465 ug/dL   Saturation Ratios 14 10.4 - 31.8 %   UIBC 210 ug/dL  Ferritin  Result Value Ref Range   Ferritin 93 11 - 307 ng/mL  Reticulocytes  Result Value Ref Range   Retic Ct Pct 1.4 0.4 - 3.1 %   RBC. 4.26 3.87 - 5.11 MIL/uL   Retic Count, Absolute 57.5 19.0 - 186.0 K/uL   Immature Retic Fract 29.0 (H) 2.3 - 15.9 %  Troponin I (High Sensitivity)  Result Value Ref Range   Troponin I (High Sensitivity) 7 <18 ng/L  Troponin I (High Sensitivity)  Result Value Ref Range   Troponin I (High Sensitivity) 8 <18 ng/L     C. Laural Benes, MD [facility]   03/27/2022 11:16 PM How to contact the [facility] Attending or Consulting provider 7A - 7P or covering provider during after hours 7P -7A, for this patient?  Check the care team in [facility] and  look for a) attending/consulting [facility] provider listed and b) the [facility] team listed Log into [email or web address] and use [facility] universal password to access. If you do not have the password, please contact the hospital operator. Locate the [facility] provider you are looking for under [facility] and page to a number that you can be directly reached. If you still have difficulty reaching the provider,  please page the [facility] (Director on Call) for the Hospitalists listed on amion for assistance.

## 2022-03-28 NOTE — Assessment & Plan Note (Signed)
-  Wheezing -with dyspnea and hypoxia -Continue scheduled duoneb, PRN albuterol -Continue steroid -Continue to monitor

## 2022-03-29 DIAGNOSIS — I517 Cardiomegaly: Secondary | ICD-10-CM | POA: Diagnosis not present

## 2022-03-29 DIAGNOSIS — J9601 Acute respiratory failure with hypoxia: Secondary | ICD-10-CM | POA: Diagnosis not present

## 2022-03-29 DIAGNOSIS — J189 Pneumonia, unspecified organism: Secondary | ICD-10-CM | POA: Diagnosis not present

## 2022-03-29 DIAGNOSIS — I10 Essential (primary) hypertension: Secondary | ICD-10-CM | POA: Diagnosis not present

## 2022-03-29 DIAGNOSIS — E538 Deficiency of other specified B group vitamins: Secondary | ICD-10-CM | POA: Diagnosis present

## 2022-03-29 LAB — CBC WITH DIFFERENTIAL/PLATELET
Abs Immature Granulocytes: 0.12 10*3/uL — ABNORMAL HIGH (ref 0.00–0.07)
Basophils Absolute: 0 10*3/uL (ref 0.0–0.1)
Basophils Relative: 0 %
Eosinophils Absolute: 0 10*3/uL (ref 0.0–0.5)
Eosinophils Relative: 0 %
HCT: 32.9 % — ABNORMAL LOW (ref 36.0–46.0)
Hemoglobin: 9.8 g/dL — ABNORMAL LOW (ref 12.0–15.0)
Immature Granulocytes: 1 %
Lymphocytes Relative: 13 %
Lymphs Abs: 1.2 10*3/uL (ref 0.7–4.0)
MCH: 24.6 pg — ABNORMAL LOW (ref 26.0–34.0)
MCHC: 29.8 g/dL — ABNORMAL LOW (ref 30.0–36.0)
MCV: 82.7 fL (ref 80.0–100.0)
Monocytes Absolute: 0.3 10*3/uL (ref 0.1–1.0)
Monocytes Relative: 4 %
Neutro Abs: 7.3 10*3/uL (ref 1.7–7.7)
Neutrophils Relative %: 82 %
Platelets: 298 10*3/uL (ref 150–400)
RBC: 3.98 MIL/uL (ref 3.87–5.11)
RDW: 17.3 % — ABNORMAL HIGH (ref 11.5–15.5)
WBC: 9 10*3/uL (ref 4.0–10.5)
nRBC: 0 % (ref 0.0–0.2)

## 2022-03-29 LAB — BASIC METABOLIC PANEL
Anion gap: 6 (ref 5–15)
BUN: 16 mg/dL (ref 6–20)
CO2: 29 mmol/L (ref 22–32)
Calcium: 8.5 mg/dL — ABNORMAL LOW (ref 8.9–10.3)
Chloride: 104 mmol/L (ref 98–111)
Creatinine, Ser: 0.65 mg/dL (ref 0.44–1.00)
GFR, Estimated: >60 mL/min (ref >60)
Glucose, Bld: 111 mg/dL — ABNORMAL HIGH (ref 70–99)
Potassium: 3.8 mmol/L (ref 3.5–5.1)
Sodium: 139 mmol/L (ref 135–145)

## 2022-03-29 LAB — MAGNESIUM: Magnesium: 2.5 mg/dL — ABNORMAL HIGH (ref 1.7–2.4)

## 2022-03-29 MED ORDER — DOXYCYCLINE HYCLATE 100 MG PO CAPS: 100.0000 mg | ORAL_CAPSULE | Freq: Two times a day (BID) | ORAL | 0 refills | Status: AC

## 2022-03-29 MED ORDER — SALINE SPRAY 0.65 % NA SOLN
1.0000 | NASAL | Status: DC | PRN
  Administered 2022-03-29: 1 via NASAL
  Filled 2022-03-29: qty 44

## 2022-03-29 MED ORDER — FOLIC ACID 1 MG PO TABS: 1.0000 mg | ORAL_TABLET | Freq: Every day | ORAL | 1 refills | Status: AC

## 2022-03-29 MED ORDER — DEXTROMETHORPHAN POLISTIREX ER 30 MG/5ML PO SUER: 30.0000 mg | Freq: Two times a day (BID) | ORAL | 0 refills | Status: DC | PRN

## 2022-03-29 MED ORDER — FOLIC ACID 1 MG PO TABS
1.0000 mg | ORAL_TABLET | Freq: Every day | ORAL | Status: DC
  Administered 2022-03-29: 1 mg via ORAL
  Filled 2022-03-29: qty 1

## 2022-03-29 MED ORDER — POTASSIUM CHLORIDE CRYS ER 10 MEQ PO TBCR: 10.0000 meq | EXTENDED_RELEASE_TABLET | Freq: Every day | ORAL | 0 refills | Status: DC

## 2022-03-29 MED ORDER — PREDNISONE 20 MG PO TABS: 40.0000 mg | ORAL_TABLET | Freq: Every day | ORAL | 0 refills | Status: AC

## 2022-03-29 NOTE — Discharge Summary (Addendum)
Physician Discharge Summary  Emma Morris:[id number] DOB: Jul 25, 1960 DOA: 03/27/2022  PCP: Alvina Filbert, MD  Admit date: 03/27/2022 Discharge date: 03/29/2022  Admitted From: ED Disposition: Home with Home health services  Recommendations for Outpatient Follow-up:  Follow up with PCP in 1-2 weeks Please obtain BMP in 1-2 week Please follow up on the following pending results: final culture results  Please obtain a CXR in 4-6 weeks to ensure resolution of pneumonia  Please check folic acid levels in 1-2 month to be sure has been repleted  Home Health: None  Discharge Condition: Stable  CODE STATUS: Full  DIET: heart health diet recommended   Brief Hospitalization Summary: Please see all hospital notes, images, labs for full details of the hospitalization. 61 y.o. female with medical history significant of anemia, asthma, hypertension, obesity, and more presents the ED with a chief complaint of "not feeling well." Patient reports that she has had some congestion and rattling in her chest. It started 1 week ago, but became acutely worse 2 days prior to admission. Patient denies any dyspnea. She reports that she is not compliant with her inhalers. She has had no chest pain and no palpitations. Patient does report a productive cough with yellow sputum. She also reports fatigue and malaise. She denies fever. Patient was not on O2 prior to admission. Her appetite has been good. Patient reports its been decades since she has had an asthma flare. She has never been intubated for asthma. Patient had associated nausea and vomiting x1 episode 2 days prior to admission. The emesis was nonbloody. Her breathing has gotten progressively better during her hospital stay, and she denies any further emesis or other complaints. She is still requiring O2 and oral antibiotics, and wishes to go home on these treatments. At this time she is stable for outpatient treatment with O2 and oral antibiotics.    Discharge Diagnoses:  Principal Problem:   Acute respiratory failure with hypoxia (HCC) Active Problems:   Hypokalemia   HTN (hypertension)   Asthma, chronic obstructive, with acute exacerbation (HCC)   CAP (community acquired pneumonia)   Cardiomegaly   Folic acid deficiency   Discharge Instructions:  Allergies as of 03/29/2022   No Known Allergies      Medication List     STOP taking these medications    polyethylene glycol 17 g packet Commonly known as: MiraLax       TAKE these medications    acetaminophen 500 MG tablet Commonly known as: TYLENOL Take 1,000 mg by mouth every 8 (eight) hours as needed for moderate pain.   albuterol 108 (90 Base) MCG/ACT inhaler Commonly known as: VENTOLIN HFA Inhale 2 puffs into the lungs every 6 (six) hours as needed for wheezing or shortness of breath.   amLODipine 10 MG tablet Commonly known as: NORVASC Take 10 mg by mouth daily.   dextromethorphan 30 MG/5ML liquid Commonly known as: Delsym Take 5 mLs (30 mg total) by mouth 2 (two) times daily as needed for cough.   doxycycline 100 MG capsule Commonly known as: VIBRAMYCIN Take 1 capsule (100 mg total) by mouth 2 (two) times daily for 5 days.   fluticasone 50 MCG/ACT nasal spray Commonly known as: FLONASE Place into both nostrils.   folic acid 1 MG tablet Commonly known as: FOLVITE Take 1 tablet (1 mg total) by mouth daily.   hydrochlorothiazide 25 MG tablet Commonly known as: HYDRODIURIL Take 25 mg by mouth daily.   potassium chloride 10 MEQ tablet Commonly known  asJerene Dilling Take 1 tablet (10 mEq total) by mouth daily.   predniSONE 20 MG tablet Commonly known as: DELTASONE Take 2 tablets (40 mg total) by mouth daily with breakfast for 3 days.               Durable Medical Equipment  (From admission, onward)           Start     Ordered   03/29/22 0959  For home use only DME oxygen  Once       Question Answer Comment  Length of Need  Lifetime   Mode or (Route) Nasal cannula   Liters per Minute 2   Frequency Continuous (stationary and portable oxygen unit needed)   Oxygen conserving device Yes   Oxygen delivery system Gas      03/29/22 0958            Follow-up Information     Alvina Filbert, MD. Schedule an appointment as soon as possible for a visit in 1 week(s).   Specialty: Internal Medicine Why: Hospital Follow Up Contact information: [address] [place] [address] [phone]         [facility] Follow up.   Why: Home oxygen Contact information: [address] [place] [address] [phone]                No Known Allergies Allergies as of 03/29/2022   No Known Allergies      Medication List     STOP taking these medications    polyethylene glycol 17 g packet Commonly known as: MiraLax       TAKE these medications    acetaminophen 500 MG tablet Commonly known as: TYLENOL Take 1,000 mg by mouth every 8 (eight) hours as needed for moderate pain.   albuterol 108 (90 Base) MCG/ACT inhaler Commonly known as: VENTOLIN HFA Inhale 2 puffs into the lungs every 6 (six) hours as needed for wheezing or shortness of breath.   amLODipine 10 MG tablet Commonly known as: NORVASC Take 10 mg by mouth daily.   dextromethorphan 30 MG/5ML liquid Commonly known as: Delsym Take 5 mLs (30 mg total) by mouth 2 (two) times daily as needed for cough.   doxycycline 100 MG capsule Commonly known as: VIBRAMYCIN Take 1 capsule (100 mg total) by mouth 2 (two) times daily for 5 days.   fluticasone 50 MCG/ACT nasal spray Commonly known as: FLONASE Place into both nostrils.   folic acid 1 MG tablet Commonly known as: FOLVITE Take 1 tablet (1 mg total) by mouth daily.   hydrochlorothiazide 25 MG tablet Commonly known as: HYDRODIURIL Take 25 mg by mouth daily.   potassium chloride 10 MEQ tablet Commonly known as: KLOR-CON M Take 1 tablet (10 mEq total) by mouth daily.    predniSONE 20 MG tablet Commonly known as: DELTASONE Take 2 tablets (40 mg total) by mouth daily with breakfast for 3 days.               Durable Medical Equipment  (From admission, onward)           Start     Ordered   03/29/22 0959  For home use only DME oxygen  Once       Question Answer Comment  Length of Need Lifetime   Mode or (Route) Nasal cannula   Liters per Minute 2   Frequency Continuous (stationary and portable oxygen unit needed)   Oxygen conserving device Yes  Oxygen delivery system Gas      03/29/22 0958            Procedures/Studies: ECHOCARDIOGRAM COMPLETE  Result Date: 03/28/2022    ECHOCARDIOGRAM REPORT   Patient Name:   Adriana Reams Date of Exam: 03/28/2022 Medical Rec #:  [id number]          Height:       62.0 in Accession #:    [id number]         Weight:       288.8 lb Date of Birth:  [birth year]         BSA:          2.235 m Patient Age:    60 years           BP:           119/82 mmHg Patient Gender: F                  HR:           85 bpm. [facility] Procedure: 2D Echo, Cardiac Doppler, Color Doppler and Intracardiac            Opacification Agent Indications:    Dyspnea R06.00                 Cardiomegaly I51.7  History:        Patient has no prior history of Echocardiogram examinations.                 Signs/Symptoms:Dyspnea; Risk Factors:Hypertension.  Sonographer:    Eulah Pont RDCS Referring Phys: [id number] Cleora Fleet  Sonographer Comments: Patient is obese. IMPRESSIONS  1. Left ventricular ejection fraction, by estimation, is >75%. The left ventricle has hyperdynamic function. The left ventricle has no regional wall motion abnormalities. Left ventricular diastolic parameters were normal.  2. Right ventricular systolic function is normal. The right ventricular size is normal. There is normal pulmonary artery systolic pressure.  3. Left atrial size was mildly dilated.  4. The mitral valve is normal in structure. No  evidence of mitral valve regurgitation. No evidence of mitral stenosis.  5. The aortic valve is normal in structure. Aortic valve regurgitation is not visualized. No aortic stenosis is present.  6. The inferior vena cava is dilated in size with >50% respiratory variability, suggesting right atrial pressure of 8 mmHg. Comparison(s): No prior Echocardiogram. FINDINGS  Left Ventricle: Left ventricular ejection fraction, by estimation, is >75%. The left ventricle has hyperdynamic function. The left ventricle has no regional wall motion abnormalities. Definity contrast agent was given IV to delineate the left ventricular endocardial borders. The left ventricular internal cavity size was normal in size. There is no left ventricular hypertrophy. Left ventricular diastolic parameters were normal. Right Ventricle: The right ventricular size is normal. No increase in right ventricular wall thickness. Right ventricular systolic function is normal. There is normal pulmonary artery systolic pressure. The tricuspid regurgitant velocity is 2.28 m/s, and  with an assumed right atrial pressure of 15 mmHg, the estimated right ventricular systolic pressure is 35.8 mmHg. Left Atrium: Left atrial size was mildly dilated. Right Atrium: Right atrial size was normal in size. Pericardium: Trivial pericardial effusion is present. Mitral Valve: The mitral valve is normal in structure. No evidence of mitral valve regurgitation. No evidence of mitral valve stenosis. Tricuspid Valve: The tricuspid valve is normal in structure. Tricuspid valve regurgitation is not demonstrated. No evidence of tricuspid stenosis. Aortic Valve: The aortic  valve is normal in structure. Aortic valve regurgitation is not visualized. No aortic stenosis is present. Pulmonic Valve: The pulmonic valve was normal in structure. Pulmonic valve regurgitation is not visualized. No evidence of pulmonic stenosis. Aorta: The aortic root is normal in size and structure. Venous:  The inferior vena cava is dilated in size with greater than 50% respiratory variability, suggesting right atrial pressure of 8 mmHg. IAS/Shunts: No atrial level shunt detected by color flow Doppler.  LEFT VENTRICLE PLAX 2D LVIDd:         5.80 cm   Diastology LVIDs:         3.00 cm   LV e' medial:    9.46 cm/s LV PW:         0.90 cm   LV E/e' medial:  13.2 LV IVS:        0.80 cm   LV e' lateral:   9.46 cm/s LVOT diam:     2.10 cm   LV E/e' lateral: 13.2 LV SV:         104 LV SV Index:   46 LVOT Area:     3.46 cm  RIGHT VENTRICLE RV S prime:     12.70 cm/s TAPSE (M-mode): 2.4 cm LEFT ATRIUM             Index        RIGHT ATRIUM           Index LA diam:        4.10 cm 1.83 cm/m   RA Area:     12.10 cm LA Vol (A2C):   66.8 ml 29.89 ml/m  RA Volume:   25.40 ml  11.37 ml/m LA Vol (A4C):   57.7 ml 25.82 ml/m LA Biplane Vol: 62.1 ml 27.79 ml/m  AORTIC VALVE LVOT Vmax:   148.00 cm/s LVOT Vmean:  99.400 cm/s LVOT VTI:    0.299 m  AORTA Ao Root diam: 3.10 cm Ao Asc diam:  3.40 cm MITRAL VALVE                TRICUSPID VALVE MV Area (PHT): 4.96 cm     TR Peak grad:   20.8 mmHg MV Decel Time: 153 msec     TR Vmax:        228.00 cm/s MV E velocity: 125.00 cm/s MV A velocity: 103.00 cm/s  SHUNTS MV E/A ratio:  1.21         Systemic VTI:  0.30 m                             Systemic Diam: 2.10 cm Vishnu Priya Mallipeddi Electronically signed by Winfield RastVishnu Priya Mallipeddi Signature Date/Time: 03/28/2022/4:06:12 PM    Final    DG Chest Portable 1 View  Result Date: 03/27/2022 CLINICAL DATA:  Cough, shortness of breath. EXAM: PORTABLE CHEST 1 VIEW COMPARISON:  Chest radiograph dated January 29, 2020 FINDINGS: The heart is enlarged. Left basilar opacity suggesting atelectasis or infiltrate. Low lung volumes. No acute osseous abnormality. IMPRESSION: 1. Cardiomegaly. 2. Left basilar opacity suggesting atelectasis or infiltrate. Electronically Signed   By: Larose HiresImran  Ahmed D.O.   On: 03/27/2022 23:58     Subjective: Mrs.  Forinash was seen sitting up in bed this morning. She states she feels "alright" and is breathing much easier. She denies headaches, SOB, fever, nausea, chest pain, other pain. She does report a cough. She reports she feels safe for discharge. She denies  any further complaints.  Pt asking to go home today.   Discharge Exam: Vitals:   03/29/22 0825 03/29/22 0928  BP:    Pulse:    Resp:    Temp:    SpO2: 98% 90%   Vitals:   03/29/22 0800 03/29/22 0813 03/29/22 0825 03/29/22 0928  BP: 134/82     Pulse: 80     Resp: 20     Temp: 98.5 F (36.9 C)     TempSrc: Oral     SpO2: 97% 94% 98% 90%  Weight:      Height:       General: Pt is alert, awake, not in acute distress Cardiovascular: normal S1/S2 +, no rubs, no gallops Respiratory: no rales heard, no wheezing, no rhonchi, no increased work of breathing Abdominal: Soft, NT, ND, bowel sounds + Extremities: no edema, no cyanosis   The results of significant diagnostics from this hospitalization (including imaging, microbiology, ancillary and laboratory) are listed below for reference.     Microbiology: Recent Results (from the past 240 hour(s))  Resp Panel by RT-PCR (Flu A&B, Covid) Anterior Nasal Swab     Status: None   Collection Time: 03/27/22 11:38 PM   Specimen: Anterior Nasal Swab  Result Value Ref Range Status   SARS Coronavirus 2 by RT PCR NEGATIVE NEGATIVE Final    Comment: (NOTE) SARS-CoV-2 target nucleic acids are NOT DETECTED.  The SARS-CoV-2 RNA is generally detectable in upper respiratory specimens during the acute phase of infection. The lowest concentration of SARS-CoV-2 viral copies this assay can detect is 138 copies/mL. A negative result does not preclude SARS-Cov-2 infection and should not be used as the sole basis for treatment or other patient management decisions. A negative result may occur with  improper specimen collection/handling, submission of specimen other than nasopharyngeal swab, presence of  viral mutation(s) within the areas targeted by this assay, and inadequate number of viral copies(<138 copies/mL). A negative result must be combined with clinical observations, patient history, and epidemiological information. The expected result is Negative.  Fact Sheet for Patients:  BloggerCourse.com  Fact Sheet for Healthcare Providers:  SeriousBroker.it  This test is no t yet approved or cleared by the [place] FDA and  has been authorized for detection and/or diagnosis of SARS-CoV-2 by FDA under an Emergency Use Authorization (EUA). This EUA will remain  in effect (meaning this test can be used) for the duration of the COVID-19 declaration under Section 564(b)(1) of the Act, 21 U.S.C.section 360bbb-3(b)(1), unless the authorization is terminated  or revoked sooner.       Influenza A by PCR NEGATIVE NEGATIVE Final   Influenza B by PCR NEGATIVE NEGATIVE Final    Comment: (NOTE) The Xpert Xpress SARS-CoV-2/FLU/RSV plus assay is intended as an aid in the diagnosis of influenza from Nasopharyngeal swab specimens and should not be used as a sole basis for treatment. Nasal washings and aspirates are unacceptable for Xpert Xpress SARS-CoV-2/FLU/RSV testing.  Fact Sheet for Patients: BloggerCourse.com  Fact Sheet for Healthcare Providers: SeriousBroker.it  This test is not yet approved or cleared by the [place] FDA and has been authorized for detection and/or diagnosis of SARS-CoV-2 by FDA under an Emergency Use Authorization (EUA). This EUA will remain in effect (meaning this test can be used) for the duration of the COVID-19 declaration under Section 564(b)(1) of the Act, 21 U.S.C. section 360bbb-3(b)(1), unless the authorization is terminated or revoked.  Performed at [facility], [address], [place], [place] [address]  Labs: BNP (last 3  results) Recent Labs    03/28/22 0009  BNP 31.0   Basic Metabolic Panel: Recent Labs  Lab 03/28/22 0009 03/28/22 0433 03/29/22 0429  NA 139 138 139  K 3.3* 3.8 3.8  CL 100 103 104  CO2 35* 31 29  GLUCOSE 112* 173* 111*  BUN 16 16 16   CREATININE 0.78 0.58 0.65  CALCIUM 8.5* 8.2* 8.5*  MG  --  2.3 2.5*   Liver Function Tests: Recent Labs  Lab 03/28/22 0433  AST 16  ALT 11  ALKPHOS 47  BILITOT 0.4  PROT 9.0*  ALBUMIN 3.2*   No results for input(s): "LIPASE", "AMYLASE" in the last 168 hours. No results for input(s): "AMMONIA" in the last 168 hours. CBC: Recent Labs  Lab 03/28/22 0009 03/28/22 0433 03/29/22 0429  WBC 7.0 7.2 9.0  NEUTROABS 5.6 6.3 7.3  HGB 10.7* 10.5* 9.8*  HCT 36.4 35.6* 32.9*  MCV 82.7 83.2 82.7  PLT 308 315 298   Cardiac Enzymes: No results for input(s): "CKTOTAL", "CKMB", "CKMBINDEX", "TROPONINI" in the last 168 hours. BNP: Invalid input(s): "POCBNP" CBG: No results for input(s): "GLUCAP" in the last 168 hours. D-Dimer No results for input(s): "DDIMER" in the last 72 hours. Hgb A1c No results for input(s): "HGBA1C" in the last 72 hours. Lipid Profile No results for input(s): "CHOL", "HDL", "LDLCALC", "TRIG", "CHOLHDL", "LDLDIRECT" in the last 72 hours. Thyroid function studies No results for input(s): "TSH", "T4TOTAL", "T3FREE", "THYROIDAB" in the last 72 hours.  Invalid input(s): "FREET3" Anemia work up Recent Labs    03/28/22 0736 03/28/22 0800  VITAMINB12  --  270  FOLATE 5.3*  --   FERRITIN  --  93  TIBC  --  243*  IRON  --  33  RETICCTPCT 1.4  --    Urinalysis    Component Value Date/Time   COLORURINE YELLOW 07/14/2019 1415   APPEARANCEUR CLEAR 07/14/2019 1415   LABSPEC 1.015 07/14/2019 1415   PHURINE 7.0 07/14/2019 1415   GLUCOSEU NEGATIVE 07/14/2019 1415   HGBUR NEGATIVE 07/14/2019 1415   BILIRUBINUR NEGATIVE 07/14/2019 1415   KETONESUR NEGATIVE 07/14/2019 1415   PROTEINUR NEGATIVE 07/14/2019 1415    UROBILINOGEN 0.2 05/02/2013 0321   NITRITE NEGATIVE 07/14/2019 1415   LEUKOCYTESUR NEGATIVE 07/14/2019 1415   Sepsis Labs Recent Labs  Lab 03/28/22 0009 03/28/22 0433 03/29/22 0429  WBC 7.0 7.2 9.0   Microbiology Recent Results (from the past 240 hour(s))  Resp Panel by RT-PCR (Flu A&B, Covid) Anterior Nasal Swab     Status: None   Collection Time: 03/27/22 11:38 PM   Specimen: Anterior Nasal Swab  Result Value Ref Range Status   SARS Coronavirus 2 by RT PCR NEGATIVE NEGATIVE Final    Comment: (NOTE) SARS-CoV-2 target nucleic acids are NOT DETECTED.  The SARS-CoV-2 RNA is generally detectable in upper respiratory specimens during the acute phase of infection. The lowest concentration of SARS-CoV-2 viral copies this assay can detect is 138 copies/mL. A negative result does not preclude SARS-Cov-2 infection and should not be used as the sole basis for treatment or other patient management decisions. A negative result may occur with  improper specimen collection/handling, submission of specimen other than nasopharyngeal swab, presence of viral mutation(s) within the areas targeted by this assay, and inadequate number of viral copies(<138 copies/mL). A negative result must be combined with clinical observations, patient history, and epidemiological information. The expected result is Negative.  Fact Sheet for Patients:  13/11/23  Fact Sheet  for Healthcare Providers:  SeriousBroker.it  This test is no t yet approved or cleared by the [place] and  has been authorized for detection and/or diagnosis of SARS-CoV-2 by FDA under an Emergency Use Authorization (EUA). This EUA will remain  in effect (meaning this test can be used) for the duration of the COVID-19 declaration under Section 564(b)(1) of the Act, 21 U.S.C.section 360bbb-3(b)(1), unless the authorization is terminated  or revoked sooner.        Influenza A by PCR NEGATIVE NEGATIVE Final   Influenza B by PCR NEGATIVE NEGATIVE Final    Comment: (NOTE) The Xpert Xpress SARS-CoV-2/FLU/RSV plus assay is intended as an aid in the diagnosis of influenza from Nasopharyngeal swab specimens and should not be used as a sole basis for treatment. Nasal washings and aspirates are unacceptable for Xpert Xpress SARS-CoV-2/FLU/RSV testing.  Fact Sheet for Patients: BloggerCourse.com  Fact Sheet for Healthcare Providers: SeriousBroker.it  This test is not yet approved or cleared by the [place] FDA and has been authorized for detection and/or diagnosis of SARS-CoV-2 by FDA under an Emergency Use Authorization (EUA). This EUA will remain in effect (meaning this test can be used) for the duration of the COVID-19 declaration under Section 564(b)(1) of the Act, 21 U.S.C. section 360bbb-3(b)(1), unless the authorization is terminated or revoked.  Performed at [facility], [address], [place], [place] [address]    Time coordinating discharge: 40 min  SIGNED:  Jake Shark, Medical Student  [facility] IV  [facility]    Patient seen and examined with [place], Medical student. In addition to supervising the encounter, I played a key role in the decision making process as well as reviewed key findings.  Pt requesting to go home. Says she feels well to go home.  She is agreeable to  home oxygen.  She is tolerating medication and diet well.  She is ambulating.  She is stable for discharge home today with close outpatient follow up with her PCP.  I agree with documentation as noted.    Standley Dakins, MD  Attending Physician [facility]   Time spent: 35 mins   03/29/2022, 12:34 PM How to contact the [facility] Attending or Consulting provider 7A - 7P or covering provider during after hours 7P -7A, for this patient?  Check the care team in [facility] and look for a)  attending/consulting TRH provider listed and b) the [facility] team listed Log into [email or web address] and use [facility]'s universal password to access. If you do not have the password, please contact the hospital operator. Locate the [facility] provider you are looking for under [facility] and page to a number that you can be directly reached. If you still have difficulty reaching the provider, please page the [facility] (Director on Call) for the Hospitalists listed on amion for assistance.

## 2022-03-29 NOTE — TOC Transition Note (Signed)
Transition of Care [facility]) - CM/SW Discharge Note   Patient Details  Name: LOURINE ALBERICO MRN: [id number] Date of Birth: [birth year]  [facility] CM/SW Contact:  Leitha Bleak, RN Phone Number: 03/29/2022, 4:19 PM   Clinical Narrative:   Patient is discharging home with [facility] home oxygen. CM at the bedside earlier to confirm home set up. Drivers number given to the patient. HHPT has been orders. Referral sent to [facility] with [facility]. Patient is aware the are check insurance to see if they can provide HHPT. She is okay either way. She has a walker and feels like she moves around good at home.   Final next level of care: Home w Home Health Services Barriers to Discharge: Barriers Resolved   Patient Goals and CMS Choice Patient states their goals for this hospitalization and ongoing recovery are:: to go home. CMS Medicare.gov Compare Post Acute Care list provided to:: Patient Choice offered to / list presented to : Patient  Discharge Placement                    Patient and family notified of of transfer: 03/29/22  Discharge Plan and Services                  DME Agency: Patsy Lager Date DME Agency Contacted: 03/29/22 Time DME Agency Contacted: [phone] Representative spoke with at DME Agency: Kathie Rhodes HH Arranged: PT HH Agency: [facility] Date [facility] Agency Contacted: 03/29/22 Time HH Agency Contacted: [phone] Representative spoke with at [facility] Agency: Misty Stanley   Readmission Risk Interventions    03/29/2022    2:27 PM  Readmission Risk Prevention Plan  Post Dischage Appt Complete  Medication Screening Complete  Transportation Screening Complete

## 2022-03-29 NOTE — Care Management Important Message (Signed)
Important Message  Patient Details  Name: Emma Morris MRN: [id number] Date of Birth: 1961-04-04   Medicare Important Message Given:  Yes     Leitha Bleak, RN 03/29/2022, 2:52 PM

## 2022-03-29 NOTE — Evaluation (Signed)
Physical Therapy Evaluation Patient Details Name: Emma Morris MRN: [id number] DOB: 1960/07/22 Today's Date: 03/29/2022  History of Present Illness  Emma Morris is a 61 y.o. female with medical history significant of anemia, asthma, hypertension, obesity, and more presents the ED with a chief complaint of "not feeling well."  Patient reports that she has had some congestion and rattling in her chest.  It started 1 week ago, but became acutely worse 2 days ago.  Patient denies any dyspnea.  She reports that she is not compliant with her inhalers.  She has had no chest pain and no palpitations.  Patient does report a productive cough with yellow sputum.  She also reports fatigue and malaise.  She denies fever.  Patient wears no oxygen at home.  Her appetite has been good.  Patient reports its been decades since she has had an asthma flare.  She has never been intubated for asthma.  Patient had associated nausea and vomiting x1 episode 2 days ago.  The emesis was nonbloody.  Patient reports she feels much better since having breathing treatments in the ED.  Patient denies abdominal pain, diarrhea, dysuria.  Patient has no other complaints at this time.   Clinical Impression  Patient able to sit EOB without assist, stand and ambulate in hallway with RW and no loss of balance. Patient SpO2 at 95% on room air at rest, decreased to 86% during ambulation, patient put on 2 LPM O2 with SpO2 increasing to 90% for remainder of ambulation and to 99% following activity at rest. Patient left on 2 LPM O2 and tolerated sitting up in chair after therapy - nurse notified. Plan: patient to be discharged from hospital to day and discharged from physical therapy to care of nursing for ambulation daily as tolerated for length of stay.      Recommendations for follow up therapy are one component of a multi-disciplinary discharge planning process, led by the attending physician.  Recommendations may be updated  based on patient status, additional functional criteria and insurance authorization.  Follow Up Recommendations Home health PT      Assistance Recommended at Discharge PRN  Patient can return home with the following  Assistance with cooking/housework;Help with stairs or ramp for entrance    Equipment Recommendations None recommended by PT  Recommendations for Other Services       Functional Status Assessment Patient has not had a recent decline in their functional status     Precautions / Restrictions Precautions Precautions: Fall Restrictions Weight Bearing Restrictions: No      Mobility  Bed Mobility Overal bed mobility: Modified Independent             General bed mobility comments: increased time    Transfers Overall transfer level: Modified independent Equipment used: Rolling walker (2 wheels)                    Ambulation/Gait Ambulation/Gait assistance: Modified independent (Device/Increase time), Supervision Gait Distance (Feet): 55 Feet Assistive device: Rolling walker (2 wheels) Gait Pattern/deviations: Decreased step length - right, Decreased step length - left, Decreased stride length Gait velocity: decreased     General Gait Details: patient able to ambulate in hallway with RW and no loss of balance, SpO2 at 95% on room air, decreased to 86% during ambulation, pt put on 2 LPM O2 with SpO2 increasing to 90% during remainder of ambulation, increased to 99% at rest following activity, pt left on 2 LPM  Stairs  Wheelchair Mobility    Modified Rankin (Stroke Patients Only)       Balance Overall balance assessment: Needs assistance Sitting-balance support: No upper extremity supported, Feet supported Sitting balance-Leahy Scale: Good Sitting balance - Comments: seated EOB   Standing balance support: Bilateral upper extremity supported, During functional activity, Reliant on assistive device for balance Standing  balance-Leahy Scale: Good Standing balance comment: good with RW                             Pertinent Vitals/Pain Pain Assessment Pain Assessment: No/denies pain    Home Living Family/patient expects to be discharged to:: Private residence Living Arrangements: Children Available Help at Discharge: Available PRN/intermittently;Family Type of Home: House Home Access: Stairs to enter Entrance Stairs-Rails: None Entrance Stairs-Number of Steps: 4   Home Layout: One level Home Equipment: Grab bars - tub/shower;Rolling Environmental consultant (2 wheels)      Prior Function Prior Level of Function : Independent/Modified Independent             Mobility Comments: Sales executive w/ RW ADLs Comments: Independent     Hand Dominance        Extremity/Trunk Assessment   Upper Extremity Assessment Upper Extremity Assessment: Overall WFL for tasks assessed    Lower Extremity Assessment Lower Extremity Assessment: Overall WFL for tasks assessed    Cervical / Trunk Assessment Cervical / Trunk Assessment: Normal  Communication   Communication: No difficulties  Cognition Arousal/Alertness: Awake/alert Behavior During Therapy: WFL for tasks assessed/performed Overall Cognitive Status: Within Functional Limits for tasks assessed                                          General Comments      Exercises     Assessment/Plan    PT Assessment All further PT needs can be met in the next venue of care  PT Problem List Decreased strength;Decreased activity tolerance;Decreased balance;Decreased mobility       PT Treatment Interventions      PT Goals (Current goals can be found in the Care Plan section)  Acute Rehab PT Goals Patient Stated Goal: return home PT Goal Formulation: With patient Time For Goal Achievement: 03/29/22 Potential to Achieve Goals: Good    Frequency       Co-evaluation               AM-PAC PT "6 Clicks" Mobility   Outcome Measure Help needed turning from your back to your side while in a flat bed without using bedrails?: None Help needed moving from lying on your back to sitting on the side of a flat bed without using bedrails?: None Help needed moving to and from a bed to a chair (including a wheelchair)?: None Help needed standing up from a chair using your arms (e.g., wheelchair or bedside chair)?: None Help needed to walk in hospital room?: A Little Help needed climbing 3-5 steps with a railing? : A Lot 6 Click Score: 21    End of Session Equipment Utilized During Treatment: Oxygen Activity Tolerance: Patient tolerated treatment well;Patient limited by fatigue Patient left: in chair;with call bell/phone within reach Nurse Communication: Mobility status PT Visit Diagnosis: Unsteadiness on feet (R26.81);Other abnormalities of gait and mobility (R26.89);Muscle weakness (generalized) (M62.81)    Time: 0034-9179 PT Time Calculation (min) (ACUTE ONLY): 20 min   Charges:  PT Evaluation $PT Eval Moderate Complexity: 1 Mod PT Treatments $Therapeutic Activity: 8-22 mins        Zigmund Gottron, SPT

## 2022-03-29 NOTE — TOC Transition Note (Signed)
Transition of Care [facility]) - CM/SW Discharge Note   Patient Details  Name: Emma Morris MRN: [id number] Date of Birth: Oct 18, 1960  [facility] CM/SW Contact:  Leitha Bleak, RN Phone Number: 03/29/2022, 2:29 PM   Clinical Narrative:   Patient discharging home. Qualifying note completed for home oxygen. Patient is agreeable to using [facility]. Oxygen delivered to the room. RN updated.   Final next level of care: Home/Self Care Barriers to Discharge: Barriers Resolved   Patient Goals and CMS Choice Patient states their goals for this hospitalization and ongoing recovery are:: to go home. CMS Medicare.gov Compare Post Acute Care list provided to:: Patient Choice offered to / list presented to : Patient  Discharge Placement           Patient and family notified of of transfer: 03/29/22  Discharge Plan and Services                 DME Agency: Patsy Lager Date DME Agency Contacted: 03/29/22 Time DME Agency Contacted: 1130 Representative spoke with at DME Agency: Ashly     Readmission Risk Interventions    03/29/2022    2:27 PM  Readmission Risk Prevention Plan  Post Dischage Appt Complete  Medication Screening Complete  Transportation Screening Complete

## 2022-03-29 NOTE — Progress Notes (Signed)
SATURATION QUALIFICATIONS: (This note is used to comply with regulatory documentation for home oxygen)  Patient Saturations on Room Air at Rest = 94 %  Patient Saturations on Room Air while Ambulating = 86 %  Patient Saturations on 2  Liters of oxygen while Ambulating = 97 %  Please briefly explain why patient needs home oxygen: Pts o2 dropped below 89% while ambulating

## 2022-03-29 NOTE — Discharge Instructions (Signed)
°  IMPORTANT INFORMATION: PAY CLOSE ATTENTION  ° °PHYSICIAN DISCHARGE INSTRUCTIONS ° °Follow with Primary care provider  Hunter, Denise, MD  and other consultants as instructed by your Hospitalist Physician ° °SEEK MEDICAL CARE OR RETURN TO EMERGENCY ROOM IF SYMPTOMS COME BACK, WORSEN OR NEW PROBLEM DEVELOPS  ° °Please note: °You were cared for by a hospitalist during your hospital stay. Every effort will be made to forward records to your primary care provider.  You can request that your primary care provider send for your hospital records if they have not received them.  Once you are discharged, your primary care physician will handle any further medical issues. Please note that NO REFILLS for any discharge medications will be authorized once you are discharged, as it is imperative that you return to your primary care physician (or establish a relationship with a primary care physician if you do not have one) for your post hospital discharge needs so that they can reassess your need for medications and monitor your lab values. ° °Please get a complete blood count and chemistry panel checked by your Primary MD at your next visit, and again as instructed by your Primary MD. ° °Get Medicines reviewed and adjusted: °Please take all your medications with you for your next visit with your Primary MD ° °Laboratory/radiological data: °Please request your Primary MD to go over all hospital tests and procedure/radiological results at the follow up, please ask your primary care provider to get all Hospital records sent to his/her office. ° °In some cases, they will be blood work, cultures and biopsy results pending at the time of your discharge. Please request that your primary care provider follow up on these results. ° °If you are diabetic, please bring your blood sugar readings with you to your follow up appointment with primary care.   ° °Please call and make your follow up appointments as soon as possible.   ° °Also Note  the following: °If you experience worsening of your admission symptoms, develop shortness of breath, life threatening emergency, suicidal or homicidal thoughts you must seek medical attention immediately by calling 911 or calling your MD immediately  if symptoms less severe. ° °You must read complete instructions/literature along with all the possible adverse reactions/side effects for all the Medicines you take and that have been prescribed to you. Take any new Medicines after you have completely understood and accpet all the possible adverse reactions/side effects.  ° °Do not drive when taking Pain medications or sleeping medications (Benzodiazepines) ° °Do not take more than prescribed Pain, Sleep and Anxiety Medications. It is not advisable to combine anxiety,sleep and pain medications without talking with your primary care practitioner ° °Special Instructions: If you have smoked or chewed Tobacco  in the last 2 yrs please stop smoking, stop any regular Alcohol  and or any Recreational drug use. ° °Wear Seat belts while driving.  Do not drive if taking any narcotic, mind altering or controlled substances or recreational drugs or alcohol.  ° ° ° ° ° ° °

## 2022-03-29 NOTE — Progress Notes (Signed)
Pt's vitals stable through the night. New PIV placed in right hand due to infiltration of now removed PIV. Pt's nose started bleeding, oxygen humidifier hooked to nasal cannula and PRN ocean nasal spray given for dry nares. +1 edema noted in lower extremities, bilaterally.

## 2022-03-31 LAB — MISC LABCORP TEST (SEND OUT): Labcorp test code: 83935

## 2022-04-13 DIAGNOSIS — M170 Bilateral primary osteoarthritis of knee: Secondary | ICD-10-CM | POA: Insufficient documentation

## 2022-07-15 ENCOUNTER — Encounter: Payer: Self-pay | Admitting: Radiology

## 2022-07-26 ENCOUNTER — Emergency Department (HOSPITAL_COMMUNITY): Payer: 59

## 2022-07-26 ENCOUNTER — Inpatient Hospital Stay (HOSPITAL_COMMUNITY)
Admission: EM | Admit: 2022-07-26 | Discharge: 2022-07-28 | DRG: 189 | Disposition: A | Discharge status: Home / Self Care | Payer: 59 | Source: Ambulatory Visit | Attending: Internal Medicine | Admitting: Internal Medicine

## 2022-07-26 ENCOUNTER — Other Ambulatory Visit: Payer: Self-pay

## 2022-07-26 DIAGNOSIS — E876 Hypokalemia: Secondary | ICD-10-CM | POA: Diagnosis present

## 2022-07-26 DIAGNOSIS — J4520 Mild intermittent asthma, uncomplicated: Secondary | ICD-10-CM

## 2022-07-26 DIAGNOSIS — Z79899 Other long term (current) drug therapy: Secondary | ICD-10-CM

## 2022-07-26 DIAGNOSIS — D500 Iron deficiency anemia secondary to blood loss (chronic): Secondary | ICD-10-CM | POA: Diagnosis present

## 2022-07-26 DIAGNOSIS — I110 Hypertensive heart disease with heart failure: Secondary | ICD-10-CM | POA: Diagnosis present

## 2022-07-26 DIAGNOSIS — I10 Essential (primary) hypertension: Secondary | ICD-10-CM | POA: Diagnosis not present

## 2022-07-26 DIAGNOSIS — J45909 Unspecified asthma, uncomplicated: Secondary | ICD-10-CM | POA: Insufficient documentation

## 2022-07-26 DIAGNOSIS — Z1152 Encounter for screening for COVID-19: Secondary | ICD-10-CM

## 2022-07-26 DIAGNOSIS — M170 Bilateral primary osteoarthritis of knee: Secondary | ICD-10-CM | POA: Diagnosis present

## 2022-07-26 DIAGNOSIS — G8929 Other chronic pain: Secondary | ICD-10-CM | POA: Diagnosis present

## 2022-07-26 DIAGNOSIS — J9601 Acute respiratory failure with hypoxia: Secondary | ICD-10-CM | POA: Diagnosis present

## 2022-07-26 DIAGNOSIS — Z6841 Body Mass Index (BMI) 40.0 and over, adult: Secondary | ICD-10-CM | POA: Diagnosis not present

## 2022-07-26 DIAGNOSIS — G4733 Obstructive sleep apnea (adult) (pediatric): Secondary | ICD-10-CM | POA: Diagnosis present

## 2022-07-26 DIAGNOSIS — I5033 Acute on chronic diastolic (congestive) heart failure: Secondary | ICD-10-CM | POA: Diagnosis present

## 2022-07-26 DIAGNOSIS — E662 Morbid (severe) obesity with alveolar hypoventilation: Secondary | ICD-10-CM | POA: Insufficient documentation

## 2022-07-26 DIAGNOSIS — J9600 Acute respiratory failure, unspecified whether with hypoxia or hypercapnia: Secondary | ICD-10-CM | POA: Diagnosis present

## 2022-07-26 DIAGNOSIS — R7303 Prediabetes: Secondary | ICD-10-CM | POA: Insufficient documentation

## 2022-07-26 DIAGNOSIS — M1990 Unspecified osteoarthritis, unspecified site: Secondary | ICD-10-CM | POA: Diagnosis present

## 2022-07-26 DIAGNOSIS — J189 Pneumonia, unspecified organism: Secondary | ICD-10-CM | POA: Diagnosis present

## 2022-07-26 LAB — BASIC METABOLIC PANEL
Anion gap: 8 (ref 5–15)
BUN: 17 mg/dL (ref 8–23)
CO2: 30 mmol/L (ref 22–32)
Calcium: 8.6 mg/dL — ABNORMAL LOW (ref 8.9–10.3)
Chloride: 98 mmol/L (ref 98–111)
Creatinine, Ser: 0.68 mg/dL (ref 0.44–1.00)
GFR, Estimated: >60 mL/min (ref >60)
Glucose, Bld: 84 mg/dL (ref 70–99)
Potassium: 3.7 mmol/L (ref 3.5–5.1)
Sodium: 136 mmol/L (ref 135–145)

## 2022-07-26 LAB — RESP PANEL BY RT-PCR (RSV, FLU A&B, COVID)  RVPGX2
Influenza A by PCR: NEGATIVE
Influenza B by PCR: NEGATIVE
Resp Syncytial Virus by PCR: NEGATIVE
SARS Coronavirus 2 by RT PCR: NEGATIVE

## 2022-07-26 LAB — CBC WITH DIFFERENTIAL/PLATELET
Abs Immature Granulocytes: 0.01 10*3/uL (ref 0.00–0.07)
Basophils Absolute: 0 10*3/uL (ref 0.0–0.1)
Basophils Relative: 0 %
Eosinophils Absolute: 0.2 10*3/uL (ref 0.0–0.5)
Eosinophils Relative: 3 %
HCT: 41.2 % (ref 36.0–46.0)
Hemoglobin: 12.1 g/dL (ref 12.0–15.0)
Immature Granulocytes: 0 %
Lymphocytes Relative: 19 %
Lymphs Abs: 1 10*3/uL (ref 0.7–4.0)
MCH: 24.3 pg — ABNORMAL LOW (ref 26.0–34.0)
MCHC: 29.4 g/dL — ABNORMAL LOW (ref 30.0–36.0)
MCV: 82.7 fL (ref 80.0–100.0)
Monocytes Absolute: 0.3 10*3/uL (ref 0.1–1.0)
Monocytes Relative: 7 %
Neutro Abs: 3.6 10*3/uL (ref 1.7–7.7)
Neutrophils Relative %: 71 %
Platelets: 268 10*3/uL (ref 150–400)
RBC: 4.98 MIL/uL (ref 3.87–5.11)
RDW: 17.3 % — ABNORMAL HIGH (ref 11.5–15.5)
WBC: 5.1 10*3/uL (ref 4.0–10.5)
nRBC: 0 % (ref 0.0–0.2)

## 2022-07-26 LAB — BRAIN NATRIURETIC PEPTIDE: B Natriuretic Peptide: 23 pg/mL (ref 0.0–100.0)

## 2022-07-26 MED ORDER — ACETAMINOPHEN 650 MG RE SUPP: 650.0000 mg | Freq: Four times a day (QID) | RECTAL | Status: DC | PRN

## 2022-07-26 MED ORDER — ALBUTEROL SULFATE (2.5 MG/3ML) 0.083% IN NEBU
3.0000 mL | INHALATION_SOLUTION | Freq: Four times a day (QID) | RESPIRATORY_TRACT | Status: DC | PRN
  Administered 2022-07-26: 3 mL via RESPIRATORY_TRACT
  Filled 2022-07-26: qty 3

## 2022-07-26 MED ORDER — ACETAMINOPHEN 325 MG PO TABS
650.0000 mg | ORAL_TABLET | Freq: Four times a day (QID) | ORAL | Status: DC | PRN
  Administered 2022-07-26 – 2022-07-27 (×2): 650 mg via ORAL
  Filled 2022-07-26 (×2): qty 2

## 2022-07-26 MED ORDER — SODIUM CHLORIDE 0.9 % IV SOLN
500.0000 mg | INTRAVENOUS | Status: DC
  Administered 2022-07-26 – 2022-07-27 (×2): 500 mg via INTRAVENOUS
  Filled 2022-07-26 (×2): qty 5

## 2022-07-26 MED ORDER — HYDROCHLOROTHIAZIDE 25 MG PO TABS
25.0000 mg | ORAL_TABLET | Freq: Every day | ORAL | Status: DC
  Administered 2022-07-27: 25 mg via ORAL
  Filled 2022-07-26: qty 1

## 2022-07-26 MED ORDER — POTASSIUM CHLORIDE CRYS ER 20 MEQ PO TBCR
10.0000 meq | EXTENDED_RELEASE_TABLET | Freq: Every day | ORAL | Status: DC
  Administered 2022-07-26 – 2022-07-28 (×3): 10 meq via ORAL
  Filled 2022-07-26 (×3): qty 1

## 2022-07-26 MED ORDER — AMLODIPINE BESYLATE 5 MG PO TABS
10.0000 mg | ORAL_TABLET | Freq: Every day | ORAL | Status: DC
  Administered 2022-07-27 – 2022-07-28 (×2): 10 mg via ORAL
  Filled 2022-07-26 (×2): qty 2

## 2022-07-26 MED ORDER — METHYLPREDNISOLONE SODIUM SUCC 125 MG IJ SOLR
125.0000 mg | Freq: Once | INTRAMUSCULAR | Status: AC
  Administered 2022-07-26: 125 mg via INTRAVENOUS
  Filled 2022-07-26: qty 2

## 2022-07-26 MED ORDER — HEPARIN SODIUM (PORCINE) 5000 UNIT/ML IJ SOLN
5000.0000 [IU] | Freq: Three times a day (TID) | INTRAMUSCULAR | Status: DC
  Administered 2022-07-26 – 2022-07-28 (×4): 5000 [IU] via SUBCUTANEOUS
  Filled 2022-07-26 (×5): qty 1

## 2022-07-26 MED ORDER — IPRATROPIUM-ALBUTEROL 0.5-2.5 (3) MG/3ML IN SOLN
3.0000 mL | Freq: Once | RESPIRATORY_TRACT | Status: AC
  Administered 2022-07-26: 3 mL via RESPIRATORY_TRACT
  Filled 2022-07-26: qty 3

## 2022-07-26 MED ORDER — SODIUM CHLORIDE 0.9 % IV SOLN
1.0000 g | Freq: Once | INTRAVENOUS | Status: AC
  Administered 2022-07-26: 1 g via INTRAVENOUS
  Filled 2022-07-26: qty 10

## 2022-07-26 MED ORDER — HYDRALAZINE HCL 20 MG/ML IJ SOLN: 5.0000 mg | INTRAMUSCULAR | Status: DC | PRN

## 2022-07-26 MED ORDER — SALINE SPRAY 0.65 % NA SOLN
1.0000 | NASAL | Status: DC | PRN
  Administered 2022-07-26: 1 via NASAL
  Filled 2022-07-26: qty 44

## 2022-07-26 MED ORDER — ONDANSETRON HCL 4 MG/2ML IJ SOLN: 4.0000 mg | Freq: Four times a day (QID) | INTRAMUSCULAR | Status: DC | PRN

## 2022-07-26 MED ORDER — IOHEXOL 350 MG/ML SOLN
80.0000 mL | Freq: Once | INTRAVENOUS | Status: AC | PRN
  Administered 2022-07-26: 80 mL via INTRAVENOUS

## 2022-07-26 MED ORDER — MELOXICAM 7.5 MG PO TABS
7.5000 mg | ORAL_TABLET | Freq: Every day | ORAL | Status: DC | PRN
  Administered 2022-07-27 – 2022-07-28 (×2): 7.5 mg via ORAL
  Filled 2022-07-26 (×2): qty 1

## 2022-07-26 MED ORDER — ONDANSETRON HCL 4 MG PO TABS: 4.0000 mg | ORAL_TABLET | Freq: Four times a day (QID) | ORAL | Status: DC | PRN

## 2022-07-26 NOTE — ED Triage Notes (Signed)
Pt was seen at PCP today & they told her she needed to come to ED due to low oxygen.  Pt reports having a head cold last week.  Denies SOB, CP.

## 2022-07-26 NOTE — H&P (Addendum)
History and Physical    Patient: Emma Morris [id number] DOB: 09/21/1960 DOA: 07/26/2022 DOS: the patient was seen and examined on 07/26/2022 PCP: Abran Richard, MD  Patient coming from: Home    Chief Complaint:  Chief Complaint  Patient presents with   Low oxygen   Pt presenting from outpt providers office where she was found to be hypoxic. Pt complaining of 2 days of SOB and feeling unwell. Denies CP, wheezing, n/v, fever. Went to physicians office and O2 was reportedly 86%.   EDP initiated CAP and PE workup. CTA w/o evidence of PE.   HPI: Emma Morris is a 62 y.o. female with medical history significant of  days  Review of Systems: As mentioned in the history of present illness. All other systems reviewed and are negative. Past Medical History:  Diagnosis Date   Anemia    Arthritis    Asthma    Hypertension    Knee pain    Obesity    Past Surgical History:  Procedure Laterality Date   CESAREAN SECTION     COLONOSCOPY WITH PROPOFOL N/A 11/09/2021   Procedure: COLONOSCOPY WITH PROPOFOL;  Surgeon: Eloise Harman, DO;  Location: [facility];  Service: Endoscopy;  Laterality: N/A;  11:00am   DILITATION & CURRETTAGE/HYSTROSCOPY WITH THERMACHOICE ABLATION N/A 05/04/2013   Procedure: DILATATION & CURETTAGE/HYSTEROSCOPY WITH THERMACHOICE ABLATION;  Surgeon: Florian Buff, MD;  Location: [facility];  Service: Gynecology;  Laterality: N/A;  temperature:87 degrees; total therapy time:14 minutes and 5 seconds   ESOPHAGOGASTRODUODENOSCOPY (EGD) WITH PROPOFOL N/A 11/09/2021   Procedure: ESOPHAGOGASTRODUODENOSCOPY (EGD) WITH PROPOFOL;  Surgeon: Eloise Harman, DO;  Location: [facility];  Service: Endoscopy;  Laterality: N/A;   TONSILLECTOMY     Social History:  reports that she has never smoked. She has never used smokeless tobacco. She reports that she does not drink alcohol and does not use drugs.  No Known Allergies  Family History  Problem Relation Age of  Onset   Cancer Mother    Kidney disease Father    Diabetes Father    Colon cancer Neg Hx     Prior to Admission medications   Medication Sig Start Date End Date Taking? Authorizing Provider  albuterol (PROVENTIL HFA;VENTOLIN HFA) 108 (90 Base) MCG/ACT inhaler Inhale 2 puffs into the lungs every 6 (six) hours as needed for wheezing or shortness of breath.   Yes [provider]  amLODipine (NORVASC) 10 MG tablet Take 10 mg by mouth daily. 05/17/19  Yes [provider]  cholecalciferol (VITAMIN D3) 25 MCG (1000 UNIT) tablet Take 1,000 Units by mouth daily.   Yes [provider]  fluticasone (FLONASE) 50 MCG/ACT nasal spray Place into both nostrils. 01/27/21  Yes [provider]  hydrochlorothiazide (HYDRODIURIL) 25 MG tablet Take 25 mg by mouth daily.   Yes [provider]  meloxicam (MOBIC) 7.5 MG tablet Take 7.5 mg by mouth daily. 07/08/22  Yes [provider]  potassium chloride (KLOR-CON M) 10 MEQ tablet Take 1 tablet (10 mEq total) by mouth daily. 03/29/22  Yes Johnson, Clanford L, MD  acetaminophen (TYLENOL) 500 MG tablet Take 1,000 mg by mouth every 8 (eight) hours as needed for moderate pain. Patient not taking: Reported on 07/26/2022    [provider]  dextromethorphan (DELSYM) 30 MG/5ML liquid Take 5 mLs (30 mg total) by mouth 2 (two) times daily as needed for cough. Patient not taking: Reported on 07/26/2022 03/29/22   Murlean Iba, MD  Physical Exam: Vitals:   07/26/22 1741 07/26/22 1813 07/26/22 1820 07/26/22 1825  BP:   128/84   Pulse: 99  (!) 101   Resp: 20  20   Temp:   97.6 F (36.4 C)   TempSrc:   Oral   SpO2: 93%  93% 97%  Weight:  129.8 kg    Height:  '5\' 2"'$  (1.575 m)     General:  Appears calm and comfortable Eyes:  PERRL, EOMI, normal lids, iris ENT:  grossly normal hearing, lips & tongue, mmm Neck:  no LAD, masses or thyromegaly Cardiovascular:  RRR, no m/r/g. No LE edema.  Respiratory:  Diminished aeration throughout. On 2L. Increased effort.  Abdomen:  soft, ntnd, NABS Skin:  no rash or induration seen on limited exam Musculoskeletal:  grossly normal tone BUE/BLE, good ROM, no bony abnormality Psychiatric:  grossly normal mood and affect, speech fluent and appropriate, AOx3 Neurologic:  CN 2-12 grossly intact, moves all extremities in coordinated fashion, sensation intact  Data Reviewed: ECG: sinus. No ACS.  CT chest - Multifocal patchy infiltrate noted on my read. No PE.  Assessment and Plan: Principal Problem:   Acute respiratory failure with hypoxia ([place]) Active Problems:   HTN (hypertension)   CAP (community acquired pneumonia)   Obesity hypoventilation syndrome (HCC)   Asthma, mild   Acute respiratory failure: secondary to CAP and obesity hypoventilation syndrom. Viral respiratory panel negative. WBC 5.1. on  2L [place] - new requirement. BNP 23.  - contiue CTX and Azithro - Wean O2 as able - BCX, Sputum Cx HTN: - continue ohome HCTZ, Norvasc  Hypokalemia: chronic. On Kdur outpt. Nml on admission.  - contiue home [place] MSK pain: chronic and intermittent - continue home Mobic.   Chronic anemia of blood loss: Hgb 12.1. Higher than her baseline of 9.8. Likely due in part from hemoconcentration from poor po intake.  - CBC in am.    Advance Care Planning:   Code Status: Full Code full  Consults: none  Family Communication: none  Severity of Illness: The appropriate patient status for this patient is INPATIENT. Inpatient status is judged to be reasonable and necessary in order to provide the required intensity of service to ensure the patient's safety. The patient's presenting symptoms, physical exam findings, and initial radiographic and laboratory data in the context of their chronic comorbidities is felt to place them at high risk for further clinical deterioration. Furthermore, it is not anticipated that the patient will be medically stable for discharge from  the hospital within 2 midnights of admission.   * I certify that at the point of admission it is my clinical judgment that the patient will require inpatient hospital care spanning beyond 2 midnights from the point of admission due to high intensity of service, high risk for further deterioration and high frequency of surveillance required.*  Author: Waldemar Dickens, MD 07/26/2022 7:15 PM  For on call review [email or web address].

## 2022-07-26 NOTE — ED Provider Notes (Signed)
Signout from Dr. Gilford Raid.  62 year old female with URI symptoms cough shortness of breath.  Had low sats on presentation.  Workup showing fairly unremarkable lab work negative COVID and flu.  She has been given antibiotics and she is pending CT angio chest. Physical Exam  BP 121/81   Pulse 93   Temp 98.3 F (36.8 C) (Oral)   Resp 15   Ht '5\' 2"'$  (1.575 m)   Wt 127 kg   SpO2 98%   BMI 51.21 kg/m   Physical Exam  Procedures  Procedures  ED Course / MDM    Medical Decision Making Amount and/or Complexity of Data Reviewed Labs: ordered. Radiology: ordered.  Risk Prescription drug management.   CT angio does not show any acute PE.  Does show bilateral pulmonary groundglass opacities infiltrate versus edema.  Will need admission for hypoxia, possible pneumonia. Hospitalist has been paged.   Discussed with [facility] hospitalist Barbaraann Faster who will evaluate patient for admission.       Hayden Rasmussen, MD 07/27/22 1027

## 2022-07-26 NOTE — ED Provider Notes (Signed)
[facility] Provider Note   CSN: [id number] Arrival date & time: 07/26/22  1234     History {Add pertinent medical, surgical, social history, OB history to HPI:1} Chief Complaint  Patient presents with   Low oxygen    Emma Morris is a 62 y.o. female.  Pt is a 62 yo female with pmhx significant for asthma, htn, anemia, arthritis, and obesity.  Pt has had uri sx and cough for the past few days.  She had an appt today with the Bariatric surgeon today.  O2 sat noted to be 86-88% on RA.  Pt does not use oxygen at home.  She was told to come to the ED for further eval.  Pt had an O2 sat of 86% in triage and was put on 2L.  O2 sats now in the mid-90s.       Home Medications Prior to Admission medications   Medication Sig Start Date End Date Taking? Authorizing Provider  acetaminophen (TYLENOL) 500 MG tablet Take 1,000 mg by mouth every 8 (eight) hours as needed for moderate pain.    [provider]  albuterol (PROVENTIL HFA;VENTOLIN HFA) 108 (90 Base) MCG/ACT inhaler Inhale 2 puffs into the lungs every 6 (six) hours as needed for wheezing or shortness of breath.    [provider]  amLODipine (NORVASC) 10 MG tablet Take 10 mg by mouth daily. 05/17/19   [provider]  dextromethorphan (DELSYM) 30 MG/5ML liquid Take 5 mLs (30 mg total) by mouth 2 (two) times daily as needed for cough. 03/29/22   Johnson, Clanford L, MD  fluticasone (FLONASE) 50 MCG/ACT nasal spray Place into both nostrils. 01/27/21   [provider]  hydrochlorothiazide (HYDRODIURIL) 25 MG tablet Take 25 mg by mouth daily.    [provider]  potassium chloride (KLOR-CON M) 10 MEQ tablet Take 1 tablet (10 mEq total) by mouth daily. 03/29/22   Murlean Iba, MD      Allergies    Patient has no known allergies.    Review of Systems   Review of Systems  HENT:  Positive for congestion.   Respiratory:  Positive for  cough.     Physical Exam Updated Vital Signs BP 124/86 (BP Location: Right Arm)   Pulse (!) 108   Temp 98.3 F (36.8 C) (Oral)   Resp (!) 22   Ht '5\' 2"'$  (1.575 m)   Wt 127 kg   SpO2 (!) 86%   BMI 51.21 kg/m  Physical Exam Vitals and nursing note reviewed.  Constitutional:      Appearance: Normal appearance. She is obese.  HENT:     Head: Normocephalic and atraumatic.     Right Ear: External ear normal.     Left Ear: External ear normal.     Nose: Nose normal.     Mouth/Throat:     Mouth: Mucous membranes are moist.     Pharynx: Oropharynx is clear.  Eyes:     Extraocular Movements: Extraocular movements intact.     Conjunctiva/sclera: Conjunctivae normal.     Pupils: Pupils are equal, round, and reactive to light.  Cardiovascular:     Rate and Rhythm: Normal rate and regular rhythm.     Pulses: Normal pulses.     Heart sounds: Normal heart sounds.  Pulmonary:     Effort: Pulmonary effort is normal.     Breath sounds: Normal breath sounds.  Abdominal:     General: Abdomen is  flat. Bowel sounds are normal.     Palpations: Abdomen is soft.  Musculoskeletal:        General: Normal range of motion.     Cervical back: Normal range of motion and neck supple.  Skin:    General: Skin is warm.     Capillary Refill: Capillary refill takes less than 2 seconds.  Neurological:     General: No focal deficit present.     Mental Status: She is alert and oriented to person, place, and time.  Psychiatric:        Mood and Affect: Mood normal.        Behavior: Behavior normal.     ED Results / Procedures / Treatments   Labs (all labs ordered are listed, but only abnormal results are displayed) Labs Reviewed  RESP PANEL BY RT-PCR (RSV, FLU A&B, COVID)  [id number]  BASIC METABOLIC PANEL  BRAIN NATRIURETIC PEPTIDE  CBC WITH DIFFERENTIAL/PLATELET    EKG None  Radiology No results found.  Procedures Procedures  {Document cardiac monitor, telemetry assessment procedure when  appropriate:1}  Medications Ordered in ED Medications - No data to display  ED Course/ Medical Decision Making/ A&P   {   Click here for ABCD2, HEART and other calculatorsREFRESH Note before signing :1}                          Medical Decision Making Amount and/or Complexity of Data Reviewed Labs: ordered. Radiology: ordered.   This patient presents to the ED for concern of sob, this involves an extensive number of treatment options, and is a complaint that carries with it a high risk of complications and morbidity.  The differential diagnosis includes pna, bronchitis, covid/flu/rsv, PE   Co morbidities that complicate the patient evaluation  asthma, htn, anemia, arthritis, and obesity   Additional history obtained:  Additional history obtained from epic chart review External records from outside source obtained and reviewed including family   Lab Tests:  I Ordered, and personally interpreted labs.  The pertinent results include:  ***   Imaging Studies ordered:  I ordered imaging studies including ***  I independently visualized and interpreted imaging which showed *** I agree with the radiologist interpretation   Cardiac Monitoring:  The patient was maintained on a cardiac monitor.  I personally viewed and interpreted the cardiac monitored which showed an underlying rhythm of: ***   Medicines ordered and prescription drug management:  I ordered medication including ***  for ***  Reevaluation of the patient after these medicines showed that the patient {resolved/improved/worsened:23923::"improved"} I have reviewed the patients home medicines and have made adjustments as needed   Test Considered:  ***   Critical Interventions:  ***   Consultations Obtained:  I requested consultation with the ***,  and discussed lab and imaging findings as well as pertinent plan - they recommend: ***   Problem List / ED Course:  ***   Reevaluation:  After the  interventions noted above, I reevaluated the patient and found that they have :{resolved/improved/worsened:23923::"improved"}   Social Determinants of Health:  ***   Dispostion:  After consideration of the diagnostic results and the patients response to treatment, I feel that the patent would benefit from ***.    {Document critical care time when appropriate:1} {Document review of labs and clinical decision tools ie heart score, Chads2Vasc2 etc:1}  {Document your independent review of radiology images, and any outside records:1} {Document your discussion with family members,  caretakers, and with consultants:1} {Document social determinants of health affecting pt's care:1} {Document your decision making why or why not admission, treatments were needed:1} Final Clinical Impression(s) / ED Diagnoses Final diagnoses:  None    Rx / DC Orders ED Discharge Orders     None

## 2022-07-27 ENCOUNTER — Encounter (HOSPITAL_COMMUNITY): Payer: Self-pay | Admitting: Family Medicine

## 2022-07-27 LAB — CBC
HCT: 37.8 % (ref 36.0–46.0)
Hemoglobin: 11.2 g/dL — ABNORMAL LOW (ref 12.0–15.0)
MCH: 24.3 pg — ABNORMAL LOW (ref 26.0–34.0)
MCHC: 29.6 g/dL — ABNORMAL LOW (ref 30.0–36.0)
MCV: 82 fL (ref 80.0–100.0)
Platelets: 280 10*3/uL (ref 150–400)
RBC: 4.61 MIL/uL (ref 3.87–5.11)
RDW: 17.1 % — ABNORMAL HIGH (ref 11.5–15.5)
WBC: 6.3 10*3/uL (ref 4.0–10.5)
nRBC: 0 % (ref 0.0–0.2)

## 2022-07-27 LAB — STREP PNEUMONIAE URINARY ANTIGEN: Strep Pneumo Urinary Antigen: NEGATIVE

## 2022-07-27 LAB — HIV ANTIBODY (ROUTINE TESTING W REFLEX): HIV Screen 4th Generation wRfx: NONREACTIVE

## 2022-07-27 LAB — COMPREHENSIVE METABOLIC PANEL
ALT: 9 U/L (ref 0–44)
AST: 12 U/L — ABNORMAL LOW (ref 15–41)
Albumin: 3 g/dL — ABNORMAL LOW (ref 3.5–5.0)
Alkaline Phosphatase: 42 U/L (ref 38–126)
Anion gap: 8 (ref 5–15)
BUN: 19 mg/dL (ref 8–23)
CO2: 31 mmol/L (ref 22–32)
Calcium: 8.7 mg/dL — ABNORMAL LOW (ref 8.9–10.3)
Chloride: 97 mmol/L — ABNORMAL LOW (ref 98–111)
Creatinine, Ser: 0.81 mg/dL (ref 0.44–1.00)
GFR, Estimated: >60 mL/min (ref >60)
Glucose, Bld: 118 mg/dL — ABNORMAL HIGH (ref 70–99)
Potassium: 4.1 mmol/L (ref 3.5–5.1)
Sodium: 136 mmol/L (ref 135–145)
Total Bilirubin: 0.2 mg/dL — ABNORMAL LOW (ref 0.3–1.2)
Total Protein: 8.4 g/dL — ABNORMAL HIGH (ref 6.5–8.1)

## 2022-07-27 MED ORDER — DM-GUAIFENESIN ER 30-600 MG PO TB12
1.0000 | ORAL_TABLET | Freq: Two times a day (BID) | ORAL | Status: DC
  Administered 2022-07-27 – 2022-07-28 (×2): 1 via ORAL
  Filled 2022-07-27 (×2): qty 1

## 2022-07-27 MED ORDER — FUROSEMIDE 10 MG/ML IJ SOLN
40.0000 mg | Freq: Every day | INTRAMUSCULAR | Status: DC
  Administered 2022-07-27 – 2022-07-28 (×2): 40 mg via INTRAVENOUS
  Filled 2022-07-27 (×2): qty 4

## 2022-07-27 MED ORDER — MUSCLE RUB 10-15 % EX CREA
TOPICAL_CREAM | CUTANEOUS | Status: DC | PRN
  Filled 2022-07-27: qty 85

## 2022-07-27 NOTE — TOC Progression Note (Signed)
Transition of Care [facility]) - Progression Note    Patient Details  Name: Emma Morris MRN: [id number] Date of Birth: 26-Jan-1961  Transition of Care [facility]) CM/SW Contact  Salome Arnt, [place] Phone Number: 07/27/2022, 10:08 AM  Clinical Narrative:  Transition of Care (TOC) Screening Note   Patient Details  Name: Emma Morris Date of Birth: 1960/08/11   Transition of Care [facility]) CM/SW Contact:    Salome Arnt, LCSW Phone Number: 07/27/2022, 10:08 AM    Transition of Care Department [facility]) has reviewed patient and no TOC needs have been identified at this time. We will continue to monitor patient advancement through interdisciplinary progression rounds. If new patient transition needs arise, please place a TOC consult.             Expected Discharge Plan and Services                                               Social Determinants of Health (SDOH) Interventions SDOH Screenings   Tobacco Use: Low Risk  (03/27/2022)    Readmission Risk Interventions    03/29/2022    2:27 PM  Readmission Risk Prevention Plan  Post Dischage Appt Complete  Medication Screening Complete  Transportation Screening Complete

## 2022-07-27 NOTE — Progress Notes (Signed)
PROGRESS NOTE     Emma Morris, is a 62 y.o. female, DOB - [birth year], [id number]  Admit date - 07/26/2022   Admitting Physician Waldemar Dickens, MD  Outpatient Primary MD for the patient is Abran Richard, MD  LOS - 1  Chief Complaint  Patient presents with   Low oxygen        Brief Narrative:  -62 year old morbidly obese female with past medical history relevant for asthma and hypertension admitted on 07/26/2022 from PCPs office with acute hypoxic respiratory failure and found to have pneumonia and CHF    -Assessment and Plan: 1) acute hypoxic respiratory failure secondary to combination of pneumonia and CHF in the morbidly obese female -BNP is only 23.  Patient is morbidly obese and BNP may not be accurate patient with a BMI of 52 -Clinical exam and imaging studies suggest pneumonia and CHF -IV Lasix, daily weights and fluid input and output monitoring--for CHF Rx -IV Rocephin and azithromycin for pneumonia Rx  2) presumed CAP--antibiotics as above #1 -Bronchodilators and mucolytics as ordered  3)Morbid Obesity/OSA/suspect some component of obesity hypoventilation syndrome -Low calorie diet, portion control and increase physical activity discussed with patient -Body mass index is 52.34 kg/m. -CPAP nightly  4)HFpEF /CHF exacer--IV diuresis and CHF management as above #1 -Echo from 04/05/2022 with EF greater than 55% with hyperdynamic left ventricle without pulmonary artery hypertension  5)HTN-continue amlodipine -Stop HCTZ while on IV Lasix  Status is: Inpatient   Disposition: The patient is from: Home              Anticipated d/c is to: Home              Anticipated d/c date is: 2 days              Patient currently is not medically stable to d/c. Barriers: Not Clinically Stable-   Code Status :  -  Code Status: Full Code   Family Communication:    NA (patient is alert, awake and coherent)   DVT Prophylaxis  :   - SCDs  heparin injection 5,000 Units  Start: 07/26/22 2200   Lab Results  Component Value Date   PLT 280 07/27/2022    Inpatient Medications  Scheduled Meds:  amLODipine  10 mg Oral Daily   heparin  5,000 Units Subcutaneous Q8H   hydrochlorothiazide  25 mg Oral Daily   potassium chloride  10 mEq Oral Daily   Continuous Infusions:  azithromycin 500 mg (07/27/22 1547)   PRN Meds:.acetaminophen **OR** acetaminophen, albuterol, hydrALAZINE, meloxicam, Muscle Rub, ondansetron **OR** ondansetron (ZOFRAN) IV, sodium chloride   Anti-infectives (From admission, onward)    Start     Dose/Rate Route Frequency Ordered Stop   07/26/22 1500  cefTRIAXone (ROCEPHIN) 1 g in sodium chloride 0.9 % 100 mL IVPB        1 g 200 mL/hr over 30 Minutes Intravenous  Once 07/26/22 1453 07/26/22 1630   07/26/22 1500  azithromycin (ZITHROMAX) 500 mg in sodium chloride 0.9 % 250 mL IVPB        500 mg 250 mL/hr over 60 Minutes Intravenous Every 24 hours 07/26/22 1453           Subjective: Emma Morris today has no fevers, no emesis,  No chest pain,   -Cough and congestion persist -Voiding well -Able to wean off O2   Objective: Vitals:   07/26/22 2337 07/27/22 0409 07/27/22 0811 07/27/22 1306  BP: 103/66 112/82 (!) 109/93 126/76  Pulse: Marland Kitchen)  102 (!) 108 79 67  Resp: '20 20 20 20  '$ Temp: 98.3 F (36.8 C) 98 F (36.7 C) 98 F (36.7 C) 97.8 F (36.6 C)  TempSrc: Oral Axillary Oral Oral  SpO2: 95% 97% 100% 99%  Weight:      Height:        Intake/Output Summary (Last 24 hours) at 07/27/2022 1945 Last data filed at 07/27/2022 1700 Gross per 24 hour  Intake 850.42 ml  Output --  Net 850.42 ml   Filed Weights   07/26/22 1254 07/26/22 1813  Weight: 127 kg 129.8 kg    Physical Exam  Gen:- Awake Alert, morbidly obese with dyspnea on exertion but no conversational dyspnea HEENT:- [place].AT, No sclera icterus Nose- [place] 2L/min Neck-Supple Neck, +ve JVD,.  Lungs-diminished breath with faint bibasilar rales and rhonchi , no  wheezing  CV- S1, S2 normal, regular  Abd-  +ve B.Sounds, Abd Soft, No tenderness, increased truncal adiposity    Extremity/Skin:- +ve edema, pedal pulses present  Psych-affect is appropriate, oriented x3 Neuro-generalized weakness, no new focal deficits, no tremors  Data Reviewed: I have personally reviewed following labs and imaging studies  CBC: Recent Labs  Lab 07/26/22 1354 07/27/22 0449  WBC 5.1 6.3  NEUTROABS 3.6  --   HGB 12.1 11.2*  HCT 41.2 37.8  MCV 82.7 82.0  PLT 268 [id number]   Basic Metabolic Panel: Recent Labs  Lab 07/26/22 1354 07/27/22 0449  NA 136 136  K 3.7 4.1  CL 98 97*  CO2 30 31  GLUCOSE 84 118*  BUN 17 19  CREATININE 0.68 0.81  CALCIUM 8.6* 8.7*   GFR: Estimated Creatinine Clearance: 94.4 mL/min (by C-G formula based on SCr of 0.81 mg/dL). Liver Function Tests: Recent Labs  Lab 07/27/22 0449  AST 12*  ALT 9  ALKPHOS 42  BILITOT 0.2*  PROT 8.4*  ALBUMIN 3.0*   Recent Results (from the past 240 hour(s))  Resp panel by RT-PCR (RSV, Flu A&B, Covid) Anterior Nasal Swab     Status: None   Collection Time: 07/26/22  1:35 PM   Specimen: Anterior Nasal Swab  Result Value Ref Range Status   SARS Coronavirus 2 by RT PCR NEGATIVE NEGATIVE Final    Comment: (NOTE) SARS-CoV-2 target nucleic acids are NOT DETECTED.  The SARS-CoV-2 RNA is generally detectable in upper respiratory specimens during the acute phase of infection. The lowest concentration of SARS-CoV-2 viral copies this assay can detect is 138 copies/mL. A negative result does not preclude SARS-Cov-2 infection and should not be used as the sole basis for treatment or other patient management decisions. A negative result may occur with  improper specimen collection/handling, submission of specimen other than nasopharyngeal swab, presence of viral mutation(s) within the areas targeted by this assay, and inadequate number of viral copies(<138 copies/mL). A negative result must be combined  with clinical observations, patient history, and epidemiological information. The expected result is Negative.  Fact Sheet for Patients:  EntrepreneurPulse.com.au  Fact Sheet for Healthcare Providers:  IncredibleEmployment.be  This test is no t yet approved or cleared by the [place] FDA and  has been authorized for detection and/or diagnosis of SARS-CoV-2 by FDA under an Emergency Use Authorization (EUA). This EUA will remain  in effect (meaning this test can be used) for the duration of the COVID-19 declaration under Section 564(b)(1) of the Act, 21 U.S.C.section 360bbb-3(b)(1), unless the authorization is terminated  or revoked sooner.       Influenza A by PCR  NEGATIVE NEGATIVE Final   Influenza B by PCR NEGATIVE NEGATIVE Final    Comment: (NOTE) The Xpert Xpress SARS-CoV-2/FLU/RSV plus assay is intended as an aid in the diagnosis of influenza from Nasopharyngeal swab specimens and should not be used as a sole basis for treatment. Nasal washings and aspirates are unacceptable for Xpert Xpress SARS-CoV-2/FLU/RSV testing.  Fact Sheet for Patients: EntrepreneurPulse.com.au  Fact Sheet for Healthcare Providers: IncredibleEmployment.be  This test is not yet approved or cleared by the [place] FDA and has been authorized for detection and/or diagnosis of SARS-CoV-2 by FDA under an Emergency Use Authorization (EUA). This EUA will remain in effect (meaning this test can be used) for the duration of the COVID-19 declaration under Section 564(b)(1) of the Act, 21 U.S.C. section 360bbb-3(b)(1), unless the authorization is terminated or revoked.     Resp Syncytial Virus by PCR NEGATIVE NEGATIVE Final    Comment: (NOTE) Fact Sheet for Patients: EntrepreneurPulse.com.au  Fact Sheet for Healthcare Providers: IncredibleEmployment.be  This test is not yet approved  or cleared by the [place] FDA and has been authorized for detection and/or diagnosis of SARS-CoV-2 by FDA under an Emergency Use Authorization (EUA). This EUA will remain in effect (meaning this test can be used) for the duration of the COVID-19 declaration under Section 564(b)(1) of the Act, 21 U.S.C. section 360bbb-3(b)(1), unless the authorization is terminated or revoked.  Performed at [facility], [address] [place], [place] [address]   Culture, blood (routine x 2) Call MD if unable to obtain prior to antibiotics being given     Status: None (Preliminary result)   Collection Time: 07/26/22  8:31 PM   Specimen: BLOOD  Result Value Ref Range Status   Specimen Description BLOOD BLOOD RIGHT ARM  Final   Special Requests   Final    BOTTLES DRAWN AEROBIC AND ANAEROBIC Blood Culture adequate volume Performed at [facility], [address], [place], [place] [address]    Culture PENDING  Incomplete   Report Status PENDING  Incomplete  Culture, blood (routine x 2) Call MD if unable to obtain prior to antibiotics being given     Status: None (Preliminary result)   Collection Time: 07/26/22  8:34 PM   Specimen: BLOOD  Result Value Ref Range Status   Specimen Description BLOOD BLOOD RIGHT HAND  Final   Special Requests   Final    BOTTLES DRAWN AEROBIC AND ANAEROBIC Blood Culture adequate volume Performed at [facility], [address], [place],  [address]    Culture PENDING  Incomplete   Report Status PENDING  Incomplete      Radiology Studies: CT Angio Chest PE W and/or Wo Contrast  Result Date: 07/26/2022 CLINICAL DATA:  Pulmonary embolism (PE) suspected, high prob. Hypoxia EXAM: CT ANGIOGRAPHY CHEST WITH CONTRAST TECHNIQUE: Multidetector CT imaging of the chest was performed using the standard protocol during bolus administration of intravenous contrast. Multiplanar CT image reconstructions and MIPs were obtained to evaluate the vascular anatomy. RADIATION DOSE  REDUCTION: This exam was performed according to the departmental dose-optimization program which includes automated exposure control, adjustment of the mA and/or kV according to patient size and/or use of iterative reconstruction technique. CONTRAST:  65m OMNIPAQUE IOHEXOL 350 MG/ML SOLN COMPARISON:  07/14/2019 FINDINGS: Cardiovascular: SVC is patent. There is a prominent left superior intercostal vein as before, presumably anatomic variant. Heart size normal. No pericardial effusion. RV nondilated. Satisfactory opacification of pulmonary arteries noted, and there is no evidence of pulmonary emboli. Adequate contrast  opacification of the thoracic aorta with no evidence of dissection, aneurysm, or stenosis. There is patent proximal brachiocephalic arterial anatomy without stenosis. Mediastinum/Nodes: Small hiatal hernia.  No mass or adenopathy. Lungs/Pleura: No significant pleural effusion. No pneumothorax. Coarse scattered bilateral suprahilar and posterior lower lobe ground-glass infiltrates. Coarse peripheral interstitial thickening posteriorly and in both lung bases. Upper Abdomen: No acute findings. Musculoskeletal: Vertebral endplate spurring at multiple levels in the mid thoracic spine. No fracture or worrisome bone lesion. Review of the MIP images confirms the above findings. IMPRESSION: 1. Negative for acute PE or thoracic aortic dissection. 2. Bilateral ground-glass pulmonary infiltrates, and dependent interstitial thickening possibly atypical edema versus infectious/inflammatory etiology. 3. Small hiatal hernia. Electronically Signed   By: Lucrezia Europe M.D.   On: 07/26/2022 16:14   DG Chest Port 1 View  Result Date: 07/26/2022 CLINICAL DATA:  Provided history: Shortness of breath. EXAM: PORTABLE CHEST 1 VIEW COMPARISON:  Prior chest radiographs 03/27/2022 and earlier. FINDINGS: Cardiomegaly. Central pulmonary vascular congestion with suspected interstitial edema. Some linear atelectasis within the mid  left lung. Ill-defined opacity within the left lung base with blunting of the left lateral costophrenic angle. Subtle opacity within the right lateral costophrenic angle. Degenerative changes of the spine. IMPRESSION: 1. Cardiomegaly. 2. Central pulmonary vascular congestion with suspected interstitial edema. 3. Ill-defined opacity within the left lung base which may reflect atelectasis, a small left pleural effusion and/or pneumonia. 4. Subtle opacity within the right lateral costophrenic angle, which may reflect atelectasis or pneumonia. 5. Small focus of linear atelectasis within the mid left lung. Electronically Signed   By: Kellie Simmering D.O.   On: 07/26/2022 13:37     Scheduled Meds:  amLODipine  10 mg Oral Daily   heparin  5,000 Units Subcutaneous Q8H   hydrochlorothiazide  25 mg Oral Daily   potassium chloride  10 mEq Oral Daily   Continuous Infusions:  azithromycin 500 mg (07/27/22 1547)     LOS: 1 day   Roxan Hockey M.D on 07/27/2022 at 7:45 PM  Go to [email or web address] - for contact info  [facility] - Office  [phone]  If 7PM-7AM, please contact night-coverage [email or web address] 07/27/2022, 7:45 PM

## 2022-07-28 LAB — BASIC METABOLIC PANEL
Anion gap: 9 (ref 5–15)
BUN: 19 mg/dL (ref 8–23)
CO2: 33 mmol/L — ABNORMAL HIGH (ref 22–32)
Calcium: 8.7 mg/dL — ABNORMAL LOW (ref 8.9–10.3)
Chloride: 95 mmol/L — ABNORMAL LOW (ref 98–111)
Creatinine, Ser: 0.73 mg/dL (ref 0.44–1.00)
GFR, Estimated: >60 mL/min (ref >60)
Glucose, Bld: 84 mg/dL (ref 70–99)
Potassium: 3.5 mmol/L (ref 3.5–5.1)
Sodium: 137 mmol/L (ref 135–145)

## 2022-07-28 MED ORDER — AZITHROMYCIN 500 MG PO TABS: 500.0000 mg | ORAL_TABLET | Freq: Every day | ORAL | 0 refills | Status: AC

## 2022-07-28 MED ORDER — MELOXICAM 7.5 MG PO TABS: 7.5000 mg | ORAL_TABLET | Freq: Every day | ORAL | 0 refills | Status: DC

## 2022-07-28 MED ORDER — CEFDINIR 300 MG PO CAPS: 300.0000 mg | ORAL_CAPSULE | Freq: Two times a day (BID) | ORAL | 0 refills | Status: AC

## 2022-07-28 MED ORDER — FUROSEMIDE 40 MG PO TABS: 40.0000 mg | ORAL_TABLET | Freq: Every day | ORAL | 11 refills | Status: DC

## 2022-07-28 MED ORDER — SODIUM CHLORIDE 0.9 % IV SOLN
1.0000 g | INTRAVENOUS | Status: DC
  Administered 2022-07-28: 1 g via INTRAVENOUS
  Filled 2022-07-28: qty 10

## 2022-07-28 MED ORDER — DEXTROMETHORPHAN POLISTIREX ER 30 MG/5ML PO SUER: 30.0000 mg | Freq: Two times a day (BID) | ORAL | 0 refills | Status: DC | PRN

## 2022-07-28 NOTE — Progress Notes (Signed)
Patient has declined the use of CPAP at this time;Pt doesn't wear a unit at home and has never worn one. She has never had a sleep study and patient has responded well to lasix and wearing O2 at 2lpm [place] with saturations around 92-95%. Rt has unit on stand by for patient.

## 2022-07-28 NOTE — Progress Notes (Signed)
Discharge instructions reviewed with patient. Patient verbalized understanding of instructions, patient discharged home with daughter in stable condition.

## 2022-07-28 NOTE — Discharge Summary (Signed)
Physician Discharge Summary   Patient: Emma Morris MRN: [id number] DOB: 10/24/1960  Admit date:     07/26/2022  Discharge date: 07/28/22  Discharge Physician: Berle Mull  PCP: Abran Richard, MD  Recommendations at discharge: Follow-up with PCP in 1 week.    Discharge Diagnoses: Principal Problem:   Acute respiratory failure with hypoxia ([place]) Active Problems:   HTN (hypertension)   CAP (community acquired pneumonia)   Obesity hypoventilation syndrome (HCC)   Asthma, mild   Acute respiratory failure (HCC)  Assessment and Plan  Acute hypoxic respiratory failure. Community-acquired pneumonia. Acute on chronic heart failure with preserved EF. Hypoxia currently resolved.  Was requiring 2 L of oxygen at the time of admission. Treated with IV Lasix with improvement in volume status. At present we will switch her home HCTZ to Lasix on discharge. Patient was also treated with IV antibiotics. Will continue the antibiotics for 3 more days. Blood cultures so far negative. No evidence of COPD exacerbation.  HTN. Patient is on Norvasc.  Which I will continue. Discontinue HCTZ and switch to Lasix.  Bilateral knee arthritis. Outpatient follow-up with PCP recommended. Patient requested a refill of her Mobic.  Morbid obesity. Body mass index is 51.69 kg/m.  Placing the patient at high risk of poor outcome. Recommend the patient to have conversation with the PCP with regards to therapy for obesity.  Consultants:  No one at bedside  Procedures performed:  none  DISCHARGE MEDICATION: Allergies as of 07/28/2022   No Known Allergies      Medication List     STOP taking these medications    hydrochlorothiazide 25 MG tablet Commonly known as: HYDRODIURIL       TAKE these medications    acetaminophen 500 MG tablet Commonly known as: TYLENOL Take 1,000 mg by mouth every 8 (eight) hours as needed for moderate pain.   albuterol 108 (90 Base) MCG/ACT  inhaler Commonly known as: VENTOLIN HFA Inhale 2 puffs into the lungs every 6 (six) hours as needed for wheezing or shortness of breath.   amLODipine 10 MG tablet Commonly known as: NORVASC Take 10 mg by mouth daily.   azithromycin 500 MG tablet Commonly known as: Zithromax Take 1 tablet (500 mg total) by mouth daily for 3 days.   cefdinir 300 MG capsule Commonly known as: OMNICEF Take 1 capsule (300 mg total) by mouth 2 (two) times daily for 3 days.   cholecalciferol 25 MCG (1000 UNIT) tablet Commonly known as: VITAMIN D3 Take 1,000 Units by mouth daily.   dextromethorphan 30 MG/5ML liquid Commonly known as: Delsym Take 5 mLs (30 mg total) by mouth 2 (two) times daily as needed for cough.   fluticasone 50 MCG/ACT nasal spray Commonly known as: FLONASE Place into both nostrils.   furosemide 40 MG tablet Commonly known as: Lasix Take 1 tablet (40 mg total) by mouth daily.   meloxicam 7.5 MG tablet Commonly known as: MOBIC Take 1 tablet (7.5 mg total) by mouth daily.   potassium chloride 10 MEQ tablet Commonly known as: KLOR-CON M Take 1 tablet (10 mEq total) by mouth daily.               Durable Medical Equipment  (From admission, onward)           Start     Ordered   07/28/22 1305  For home use only DME Walker rolling  Once       Question Answer Comment  Walker: With [place]  Patient needs a walker to treat with the following condition Degenerative arthritis of knee, bilateral      07/28/22 1305           Disposition: Home Diet recommendation: Cardiac diet  Discharge Exam: Vitals:   07/27/22 1950 07/28/22 0507 07/28/22 1100 07/28/22 1115  BP: 113/72 119/75    Pulse: 74 60    Resp: 20 20    Temp: 98 F (36.7 C) 98.1 F (36.7 C)    TempSrc: Oral Oral    SpO2: 98% 100% 94% 97%  Weight:  128.2 kg    Height:       General: Appear in mild distress; no visible Abnormal Neck Mass Or lumps, Conjunctiva normal Cardiovascular: S1 and  S2 Present, no Murmur, Respiratory: good respiratory effort, Bilateral Air entry present and CTA, no Crackles, no wheezes Abdomen: Bowel Sound present, Non tender  Extremities: no Pedal edema Neurology: alert and oriented to time, place, and person  Filed Weights   07/26/22 1254 07/26/22 1813 07/28/22 0507  Weight: 127 kg 129.8 kg 128.2 kg   Condition at discharge: stable  The results of significant diagnostics from this hospitalization (including imaging, microbiology, ancillary and laboratory) are listed below for reference.   Imaging Studies: CT Angio Chest PE W and/or Wo Contrast  Result Date: 07/26/2022 CLINICAL DATA:  Pulmonary embolism (PE) suspected, high prob. Hypoxia EXAM: CT ANGIOGRAPHY CHEST WITH CONTRAST TECHNIQUE: Multidetector CT imaging of the chest was performed using the standard protocol during bolus administration of intravenous contrast. Multiplanar CT image reconstructions and MIPs were obtained to evaluate the vascular anatomy. RADIATION DOSE REDUCTION: This exam was performed according to the departmental dose-optimization program which includes automated exposure control, adjustment of the mA and/or kV according to patient size and/or use of iterative reconstruction technique. CONTRAST:  12m OMNIPAQUE IOHEXOL 350 MG/ML SOLN COMPARISON:  07/14/2019 FINDINGS: Cardiovascular: SVC is patent. There is a prominent left superior intercostal vein as before, presumably anatomic variant. Heart size normal. No pericardial effusion. RV nondilated. Satisfactory opacification of pulmonary arteries noted, and there is no evidence of pulmonary emboli. Adequate contrast opacification of the thoracic aorta with no evidence of dissection, aneurysm, or stenosis. There is patent proximal brachiocephalic arterial anatomy without stenosis. Mediastinum/Nodes: Small hiatal hernia.  No mass or adenopathy. Lungs/Pleura: No significant pleural effusion. No pneumothorax. Coarse scattered bilateral  suprahilar and posterior lower lobe ground-glass infiltrates. Coarse peripheral interstitial thickening posteriorly and in both lung bases. Upper Abdomen: No acute findings. Musculoskeletal: Vertebral endplate spurring at multiple levels in the mid thoracic spine. No fracture or worrisome bone lesion. Review of the MIP images confirms the above findings. IMPRESSION: 1. Negative for acute PE or thoracic aortic dissection. 2. Bilateral ground-glass pulmonary infiltrates, and dependent interstitial thickening possibly atypical edema versus infectious/inflammatory etiology. 3. Small hiatal hernia. Electronically Signed   By: DLucrezia EuropeM.D.   On: 07/26/2022 16:14   DG Chest Port 1 View  Result Date: 07/26/2022 CLINICAL DATA:  Provided history: Shortness of breath. EXAM: PORTABLE CHEST 1 VIEW COMPARISON:  Prior chest radiographs 03/27/2022 and earlier. FINDINGS: Cardiomegaly. Central pulmonary vascular congestion with suspected interstitial edema. Some linear atelectasis within the mid left lung. Ill-defined opacity within the left lung base with blunting of the left lateral costophrenic angle. Subtle opacity within the right lateral costophrenic angle. Degenerative changes of the spine. IMPRESSION: 1. Cardiomegaly. 2. Central pulmonary vascular congestion with suspected interstitial edema. 3. Ill-defined opacity within the left lung base which may reflect atelectasis, a small left  pleural effusion and/or pneumonia. 4. Subtle opacity within the right lateral costophrenic angle, which may reflect atelectasis or pneumonia. 5. Small focus of linear atelectasis within the mid left lung. Electronically Signed   By: Kellie Simmering D.O.   On: 07/26/2022 13:37    Microbiology: Results for orders placed or performed during the hospital encounter of 07/26/22  Resp panel by RT-PCR (RSV, Flu A&B, Covid) Anterior Nasal Swab     Status: None   Collection Time: 07/26/22  1:35 PM   Specimen: Anterior Nasal Swab  Result Value  Ref Range Status   SARS Coronavirus 2 by RT PCR NEGATIVE NEGATIVE Final    Comment: (NOTE) SARS-CoV-2 target nucleic acids are NOT DETECTED.  The SARS-CoV-2 RNA is generally detectable in upper respiratory specimens during the acute phase of infection. The lowest concentration of SARS-CoV-2 viral copies this assay can detect is 138 copies/mL. A negative result does not preclude SARS-Cov-2 infection and should not be used as the sole basis for treatment or other patient management decisions. A negative result may occur with  improper specimen collection/handling, submission of specimen other than nasopharyngeal swab, presence of viral mutation(s) within the areas targeted by this assay, and inadequate number of viral copies(<138 copies/mL). A negative result must be combined with clinical observations, patient history, and epidemiological information. The expected result is Negative.  Fact Sheet for Patients:  EntrepreneurPulse.com.au  Fact Sheet for Healthcare Providers:  IncredibleEmployment.be  This test is no t yet approved or cleared by the [place] FDA and  has been authorized for detection and/or diagnosis of SARS-CoV-2 by FDA under an Emergency Use Authorization (EUA). This EUA will remain  in effect (meaning this test can be used) for the duration of the COVID-19 declaration under Section 564(b)(1) of the Act, 21 U.S.C.section 360bbb-3(b)(1), unless the authorization is terminated  or revoked sooner.       Influenza A by PCR NEGATIVE NEGATIVE Final   Influenza B by PCR NEGATIVE NEGATIVE Final    Comment: (NOTE) The Xpert Xpress SARS-CoV-2/FLU/RSV plus assay is intended as an aid in the diagnosis of influenza from Nasopharyngeal swab specimens and should not be used as a sole basis for treatment. Nasal washings and aspirates are unacceptable for Xpert Xpress SARS-CoV-2/FLU/RSV testing.  Fact Sheet for  Patients: EntrepreneurPulse.com.au  Fact Sheet for Healthcare Providers: IncredibleEmployment.be  This test is not yet approved or cleared by the [place] FDA and has been authorized for detection and/or diagnosis of SARS-CoV-2 by FDA under an Emergency Use Authorization (EUA). This EUA will remain in effect (meaning this test can be used) for the duration of the COVID-19 declaration under Section 564(b)(1) of the Act, 21 U.S.C. section 360bbb-3(b)(1), unless the authorization is terminated or revoked.     Resp Syncytial Virus by PCR NEGATIVE NEGATIVE Final    Comment: (NOTE) Fact Sheet for Patients: EntrepreneurPulse.com.au  Fact Sheet for Healthcare Providers: IncredibleEmployment.be  This test is not yet approved or cleared by the [place] FDA and has been authorized for detection and/or diagnosis of SARS-CoV-2 by FDA under an Emergency Use Authorization (EUA). This EUA will remain in effect (meaning this test can be used) for the duration of the COVID-19 declaration under Section 564(b)(1) of the Act, 21 U.S.C. section 360bbb-3(b)(1), unless the authorization is terminated or revoked.  Performed at [facility], [address] [place], [place] [address]   Culture, blood (routine x 2) Call MD if unable to obtain prior to antibiotics being given  Status: None (Preliminary result)   Collection Time: 07/26/22  8:31 PM   Specimen: BLOOD  Result Value Ref Range Status   Specimen Description   Final    BLOOD BLOOD RIGHT ARM Performed at [facility], [address], [place], [place] [address]    Special Requests   Final    BOTTLES DRAWN AEROBIC AND ANAEROBIC Blood Culture adequate volume Performed at [facility], [address], [place], [place] [address]    Culture   Final    NO GROWTH 2 DAYS Performed at [facility] [address] [place], [place] [address]    Report Status  PENDING  Incomplete  Culture, blood (routine x 2) Call MD if unable to obtain prior to antibiotics being given     Status: None (Preliminary result)   Collection Time: 07/26/22  8:34 PM   Specimen: BLOOD  Result Value Ref Range Status   Specimen Description   Final    BLOOD BLOOD RIGHT HAND Performed at [facility], [address], [place], [place] [address]    Special Requests   Final    BOTTLES DRAWN AEROBIC AND ANAEROBIC Blood Culture adequate volume Performed at [facility], [address] [place], [place] [address]    Culture   Final    NO GROWTH 2 DAYS Performed at [facility], [place] [address] [place], [place] [address]    Report Status PENDING  Incomplete   Labs: CBC: Recent Labs  Lab 07/26/22 1354 07/27/22 0449  WBC 5.1 6.3  NEUTROABS 3.6  --   HGB 12.1 11.2*  HCT 41.2 37.8  MCV 82.7 82.0  PLT 268 123456   Basic Metabolic Panel: Recent Labs  Lab 07/26/22 1354 07/27/22 0449 07/28/22 0449  NA 136 136 137  K 3.7 4.1 3.5  CL 98 97* 95*  CO2 30 31 33*  GLUCOSE 84 118* 84  BUN '17 19 19  '$ CREATININE 0.68 0.81 0.73  CALCIUM 8.6* 8.7* 8.7*   Liver Function Tests: Recent Labs  Lab 07/27/22 0449  AST 12*  ALT 9  ALKPHOS 42  BILITOT 0.2*  PROT 8.4*  ALBUMIN 3.0*   CBG: No results for input(s): "GLUCAP" in the last 168 hours.  Discharge time spent: greater than 30 minutes.  Signed: Berle Mull, MD [facility]

## 2022-07-28 NOTE — TOC Transition Note (Signed)
Transition of Care [facility]) - CM/SW Discharge Note   Patient Details  Name: Emma Morris MRN: [id number] Date of Birth: 05-27-1960  Transition [facility]) CM/SW Contact:  Iona Beard, [place] Phone Number: 07/28/2022, 1:56 PM   Clinical Narrative:    CSW updated by RN that pt is requesting a rolling walker at D/C. CSW spoke to pt who confirms this and does not have an agency preference. CSW spoke to Henry with [employer] who accepts DME referral. They will work on order and get walker delivered. TOC signing off.   Final next level of care: Home/Self Care Barriers to Discharge: Continued Medical Work up   Patient Goals and CMS Choice      Discharge Placement                         Discharge Plan and Services Additional resources added to the After Visit Summary for                  DME Arranged: Walker rolling DME Agency: [employer] Date DME Agency Contacted: 07/28/22   Representative spoke with at DME Agency: [place] ([place]) Interventions SDOH Screenings   Food Insecurity: No Food Insecurity (07/27/2022)  Housing: Low Risk  (07/27/2022)  Transportation Needs: No Transportation Needs (07/27/2022)  Utilities: Not At Risk (07/27/2022)  Tobacco Use: Low Risk  (07/27/2022)     Readmission Risk Interventions    03/29/2022    2:27 PM  Readmission Risk Prevention Plan  Post Dischage Appt Complete  Medication Screening Complete  Transportation Screening Complete

## 2022-07-29 LAB — LEGIONELLA PNEUMOPHILA SEROGP 1 UR AG: L. pneumophila Serogp 1 Ur Ag: NEGATIVE

## 2022-07-31 LAB — CULTURE, BLOOD (ROUTINE X 2)
Culture: NO GROWTH
Culture: NO GROWTH
Special Requests: ADEQUATE
Special Requests: ADEQUATE

## 2022-09-28 ENCOUNTER — Encounter: Payer: Self-pay | Admitting: Orthopedic Surgery

## 2022-09-28 ENCOUNTER — Ambulatory Visit (INDEPENDENT_AMBULATORY_CARE_PROVIDER_SITE_OTHER): Payer: 59 | Admitting: Orthopedic Surgery

## 2022-09-28 DIAGNOSIS — M1712 Unilateral primary osteoarthritis, left knee: Secondary | ICD-10-CM

## 2022-09-28 DIAGNOSIS — M170 Bilateral primary osteoarthritis of knee: Secondary | ICD-10-CM | POA: Diagnosis not present

## 2022-09-28 DIAGNOSIS — M1711 Unilateral primary osteoarthritis, right knee: Secondary | ICD-10-CM

## 2022-09-28 MED ORDER — TRAMADOL HCL 50 MG PO TABS: 50.0000 mg | ORAL_TABLET | Freq: Two times a day (BID) | ORAL | 0 refills | Status: DC | PRN

## 2022-09-28 NOTE — Patient Instructions (Signed)

## 2022-09-28 NOTE — Progress Notes (Signed)
Orthopaedic Clinic Return  Assessment: Emma Morris is a 62 y.o. female with the following: Bilateral knee arthritis  Plan: Ms. Nida Boatman has severe bilateral knee arthritis.  She has not had an injection in over 18 months.  These were repeated in clinic today.  No issues.  Provided her with a limited prescription for tramadol.  We also gave her a prescription for a rolling walker.  Procedure note injection Right knee joint   Verbal consent was obtained to inject the right knee joint  Timeout was completed to confirm the site of injection.  The skin was prepped with alcohol and ethyl chloride was sprayed at the injection site.  A 21-gauge needle was used to inject 40 mg of Depo-Medrol and 1% lidocaine (3 cc) into the right knee using an anterolateral approach.  There were no complications. A sterile bandage was applied.   Procedure note injection Left knee joint   Verbal consent was obtained to inject the left knee joint  Timeout was completed to confirm the site of injection.  The skin was prepped with alcohol and ethyl chloride was sprayed at the injection site.  A 21-gauge needle was used to inject 40 mg of Depo-Medrol and 1% lidocaine (3 cc) into the left knee using an anterolateral approach.  There were no complications. A sterile bandage was applied.      There is no height or weight on file to calculate BMI.   The patient meets the AMA guidelines for Morbid obesity with BMI > 40.  The patient has been counseled on weight loss.    Follow-up: Return if symptoms worsen or fail to improve.   Subjective:  Chief Complaint  Patient presents with   Knee Pain    Bilat knee pain gotten worse over the past mo.  Would like repeat injection s    History of Present Illness: Emma Morris is a 62 y.o. female who presents for repeat evaluation of her bilateral knees.  She was last seen in clinic about 18 months ago and both knees were injected.  Symptoms improved.  She  continues to have pain in both knees.  She is using a walker to assist with ambulation.  She is requesting something stronger for pain.  Review of Systems: No fevers or chills No numbness or tingling No chest pain No shortness of breath No bowel or bladder dysfunction No GI distress No headaches  Objective: There were no vitals taken for this visit.  Physical Exam:  Obese female.  No acute distress.  Alert and oriented.   Bilateral knees, diffuse anterior pain.  Tenderness along medial and lateral joint line.  Crepitus with ROM.  Varus alignment.  No increased laxity to varus or valgus stress.   IMAGING: I personally ordered and reviewed the following images:  No new imaging obtained today.   Oliver Barre, MD 09/28/2022 11:36 AM

## 2022-10-15 ENCOUNTER — Other Ambulatory Visit (HOSPITAL_COMMUNITY): Payer: Self-pay | Admitting: Family Medicine

## 2022-10-15 DIAGNOSIS — Z1231 Encounter for screening mammogram for malignant neoplasm of breast: Secondary | ICD-10-CM

## 2022-10-27 ENCOUNTER — Other Ambulatory Visit (HOSPITAL_COMMUNITY): Payer: Self-pay | Admitting: Family Medicine

## 2022-10-27 DIAGNOSIS — N6310 Unspecified lump in the right breast, unspecified quadrant: Secondary | ICD-10-CM

## 2022-11-09 ENCOUNTER — Ambulatory Visit (HOSPITAL_COMMUNITY): Payer: 59

## 2022-11-09 ENCOUNTER — Inpatient Hospital Stay (HOSPITAL_COMMUNITY): Admission: RE | Admit: 2022-11-09 | Payer: 59 | Source: Ambulatory Visit

## 2022-11-27 ENCOUNTER — Inpatient Hospital Stay (HOSPITAL_COMMUNITY)
Admission: EM | Admit: 2022-11-27 | Discharge: 2022-12-02 | DRG: 189 | Disposition: A | Discharge status: Home Health | Payer: 59 | Attending: Family Medicine | Admitting: Family Medicine

## 2022-11-27 ENCOUNTER — Other Ambulatory Visit: Payer: Self-pay

## 2022-11-27 ENCOUNTER — Encounter (HOSPITAL_COMMUNITY): Payer: Self-pay | Admitting: Emergency Medicine

## 2022-11-27 ENCOUNTER — Emergency Department (HOSPITAL_COMMUNITY): Payer: 59

## 2022-11-27 DIAGNOSIS — I2699 Other pulmonary embolism without acute cor pulmonale: Secondary | ICD-10-CM | POA: Insufficient documentation

## 2022-11-27 DIAGNOSIS — Z6841 Body Mass Index (BMI) 40.0 and over, adult: Secondary | ICD-10-CM

## 2022-11-27 DIAGNOSIS — I10 Essential (primary) hypertension: Secondary | ICD-10-CM | POA: Diagnosis present

## 2022-11-27 DIAGNOSIS — J441 Chronic obstructive pulmonary disease with (acute) exacerbation: Secondary | ICD-10-CM | POA: Diagnosis present

## 2022-11-27 DIAGNOSIS — Z833 Family history of diabetes mellitus: Secondary | ICD-10-CM

## 2022-11-27 DIAGNOSIS — Z791 Long term (current) use of non-steroidal anti-inflammatories (NSAID): Secondary | ICD-10-CM

## 2022-11-27 DIAGNOSIS — Z8701 Personal history of pneumonia (recurrent): Secondary | ICD-10-CM | POA: Diagnosis not present

## 2022-11-27 DIAGNOSIS — J189 Pneumonia, unspecified organism: Secondary | ICD-10-CM | POA: Diagnosis present

## 2022-11-27 DIAGNOSIS — J9621 Acute and chronic respiratory failure with hypoxia: Secondary | ICD-10-CM | POA: Diagnosis not present

## 2022-11-27 DIAGNOSIS — Z79899 Other long term (current) drug therapy: Secondary | ICD-10-CM

## 2022-11-27 DIAGNOSIS — Z9981 Dependence on supplemental oxygen: Secondary | ICD-10-CM | POA: Diagnosis not present

## 2022-11-27 DIAGNOSIS — R040 Epistaxis: Secondary | ICD-10-CM | POA: Diagnosis not present

## 2022-11-27 DIAGNOSIS — I2602 Saddle embolus of pulmonary artery with acute cor pulmonale: Secondary | ICD-10-CM | POA: Diagnosis not present

## 2022-11-27 DIAGNOSIS — J440 Chronic obstructive pulmonary disease with acute lower respiratory infection: Secondary | ICD-10-CM | POA: Diagnosis not present

## 2022-11-27 DIAGNOSIS — J45901 Unspecified asthma with (acute) exacerbation: Secondary | ICD-10-CM | POA: Diagnosis present

## 2022-11-27 DIAGNOSIS — R0602 Shortness of breath: Secondary | ICD-10-CM | POA: Diagnosis present

## 2022-11-27 DIAGNOSIS — Z841 Family history of disorders of kidney and ureter: Secondary | ICD-10-CM

## 2022-11-27 LAB — COMPREHENSIVE METABOLIC PANEL
ALT: 11 U/L (ref 0–44)
AST: 11 U/L — ABNORMAL LOW (ref 15–41)
Albumin: 3.2 g/dL — ABNORMAL LOW (ref 3.5–5.0)
Alkaline Phosphatase: 47 U/L (ref 38–126)
Anion gap: 5 (ref 5–15)
BUN: 14 mg/dL (ref 8–23)
CO2: 36 mmol/L — ABNORMAL HIGH (ref 22–32)
Calcium: 8.7 mg/dL — ABNORMAL LOW (ref 8.9–10.3)
Chloride: 95 mmol/L — ABNORMAL LOW (ref 98–111)
Creatinine, Ser: 0.73 mg/dL (ref 0.44–1.00)
GFR, Estimated: >60 mL/min (ref >60)
Glucose, Bld: 122 mg/dL — ABNORMAL HIGH (ref 70–99)
Potassium: 3.5 mmol/L (ref 3.5–5.1)
Sodium: 136 mmol/L (ref 135–145)
Total Bilirubin: 0.6 mg/dL (ref 0.3–1.2)
Total Protein: 8.7 g/dL — ABNORMAL HIGH (ref 6.5–8.1)

## 2022-11-27 LAB — CBC WITH DIFFERENTIAL/PLATELET
Abs Immature Granulocytes: 0.17 10*3/uL — ABNORMAL HIGH (ref 0.00–0.07)
Basophils Absolute: 0 10*3/uL (ref 0.0–0.1)
Basophils Relative: 0 %
Eosinophils Absolute: 0.1 10*3/uL (ref 0.0–0.5)
Eosinophils Relative: 1 %
HCT: 43.2 % (ref 36.0–46.0)
Hemoglobin: 12.3 g/dL (ref 12.0–15.0)
Immature Granulocytes: 2 %
Lymphocytes Relative: 10 %
Lymphs Abs: 0.9 10*3/uL (ref 0.7–4.0)
MCH: 24.9 pg — ABNORMAL LOW (ref 26.0–34.0)
MCHC: 28.5 g/dL — ABNORMAL LOW (ref 30.0–36.0)
MCV: 87.6 fL (ref 80.0–100.0)
Monocytes Absolute: 0.5 10*3/uL (ref 0.1–1.0)
Monocytes Relative: 5 %
Neutro Abs: 7.8 10*3/uL — ABNORMAL HIGH (ref 1.7–7.7)
Neutrophils Relative %: 82 %
Platelets: 261 10*3/uL (ref 150–400)
RBC: 4.93 MIL/uL (ref 3.87–5.11)
RDW: 19.9 % — ABNORMAL HIGH (ref 11.5–15.5)
WBC: 9.5 10*3/uL (ref 4.0–10.5)
nRBC: 0.2 % (ref 0.0–0.2)

## 2022-11-27 LAB — HEPARIN LEVEL (UNFRACTIONATED): Heparin Unfractionated: 0.54 IU/mL (ref 0.30–0.70)

## 2022-11-27 LAB — MRSA NEXT GEN BY PCR, NASAL: MRSA by PCR Next Gen: NOT DETECTED

## 2022-11-27 LAB — BRAIN NATRIURETIC PEPTIDE: B Natriuretic Peptide: 37 pg/mL (ref 0.0–100.0)

## 2022-11-27 LAB — PROCALCITONIN: Procalcitonin: <0.1 ng/mL

## 2022-11-27 LAB — LACTIC ACID, PLASMA
Lactic Acid, Venous: 1.1 mmol/L (ref 0.5–1.9)
Lactic Acid, Venous: 1.4 mmol/L (ref 0.5–1.9)

## 2022-11-27 MED ORDER — SODIUM CHLORIDE 0.9 % IV SOLN
2.0000 g | INTRAVENOUS | Status: DC
  Administered 2022-11-28 – 2022-12-01 (×4): 2 g via INTRAVENOUS
  Filled 2022-11-27 (×4): qty 20

## 2022-11-27 MED ORDER — HYDROCODONE-ACETAMINOPHEN 5-325 MG PO TABS
1.0000 | ORAL_TABLET | Freq: Once | ORAL | Status: AC
  Administered 2022-11-27: 1 via ORAL
  Filled 2022-11-27: qty 1

## 2022-11-27 MED ORDER — HEPARIN BOLUS VIA INFUSION
6000.0000 [IU] | Freq: Once | INTRAVENOUS | Status: AC
  Administered 2022-11-27: 6000 [IU] via INTRAVENOUS

## 2022-11-27 MED ORDER — CHLORHEXIDINE GLUCONATE CLOTH 2 % EX PADS
6.0000 | MEDICATED_PAD | Freq: Every day | CUTANEOUS | Status: DC
  Administered 2022-11-28 – 2022-11-30 (×3): 6 via TOPICAL

## 2022-11-27 MED ORDER — ONDANSETRON HCL 4 MG PO TABS: 4.0000 mg | ORAL_TABLET | Freq: Four times a day (QID) | ORAL | Status: DC | PRN

## 2022-11-27 MED ORDER — ONDANSETRON HCL 4 MG/2ML IJ SOLN
4.0000 mg | Freq: Four times a day (QID) | INTRAMUSCULAR | Status: DC | PRN
  Administered 2022-11-29: 4 mg via INTRAVENOUS
  Filled 2022-11-27: qty 2

## 2022-11-27 MED ORDER — SODIUM CHLORIDE 0.9 % IV SOLN
500.0000 mg | Freq: Once | INTRAVENOUS | Status: AC
  Administered 2022-11-27: 500 mg via INTRAVENOUS
  Filled 2022-11-27: qty 5

## 2022-11-27 MED ORDER — AMLODIPINE BESYLATE 5 MG PO TABS
10.0000 mg | ORAL_TABLET | Freq: Every day | ORAL | Status: DC
  Administered 2022-11-28 – 2022-12-02 (×5): 10 mg via ORAL
  Filled 2022-11-27 (×5): qty 2

## 2022-11-27 MED ORDER — METHYLPREDNISOLONE SODIUM SUCC 125 MG IJ SOLR
125.0000 mg | Freq: Once | INTRAMUSCULAR | Status: AC
  Administered 2022-11-27: 125 mg via INTRAVENOUS
  Filled 2022-11-27: qty 2

## 2022-11-27 MED ORDER — SODIUM CHLORIDE 0.9 % IV SOLN
2.0000 g | Freq: Once | INTRAVENOUS | Status: AC
  Administered 2022-11-27: 2 g via INTRAVENOUS
  Filled 2022-11-27: qty 20

## 2022-11-27 MED ORDER — HEPARIN (PORCINE) 25000 UT/250ML-% IV SOLN
1700.0000 [IU]/h | INTRAVENOUS | Status: DC
  Administered 2022-11-27 – 2022-11-29 (×3): 1500 [IU]/h via INTRAVENOUS
  Administered 2022-11-29 – 2022-11-30 (×2): 1700 [IU]/h via INTRAVENOUS
  Filled 2022-11-27 (×4): qty 250

## 2022-11-27 MED ORDER — IOHEXOL 350 MG/ML SOLN
75.0000 mL | Freq: Once | INTRAVENOUS | Status: AC | PRN
  Administered 2022-11-27: 75 mL via INTRAVENOUS

## 2022-11-27 MED ORDER — SODIUM CHLORIDE 0.9 % IV BOLUS
500.0000 mL | Freq: Once | INTRAVENOUS | Status: AC
  Administered 2022-11-27: 500 mL via INTRAVENOUS

## 2022-11-27 MED ORDER — SODIUM CHLORIDE 0.9 % IV SOLN
500.0000 mg | Freq: Once | INTRAVENOUS | Status: AC
  Administered 2022-11-28: 500 mg via INTRAVENOUS
  Filled 2022-11-27: qty 5

## 2022-11-27 MED ORDER — METHYLPREDNISOLONE SODIUM SUCC 125 MG IJ SOLR
60.0000 mg | Freq: Two times a day (BID) | INTRAMUSCULAR | Status: DC
  Administered 2022-11-28 – 2022-11-30 (×5): 60 mg via INTRAVENOUS
  Filled 2022-11-27 (×5): qty 2

## 2022-11-27 MED ORDER — IPRATROPIUM-ALBUTEROL 0.5-2.5 (3) MG/3ML IN SOLN
3.0000 mL | Freq: Four times a day (QID) | RESPIRATORY_TRACT | Status: DC | PRN
  Administered 2022-12-01: 3 mL via RESPIRATORY_TRACT
  Filled 2022-11-27: qty 3

## 2022-11-27 NOTE — Progress Notes (Signed)
ANTICOAGULATION CONSULT NOTE   Pharmacy Consult for heparin Indication: pulmonary embolus  No Known Allergies  Patient Measurements: Height: 5\' 2"  (157.5 cm) Weight: 132.6 kg (292 lb 5.3 oz) IBW/kg (Calculated) : 50.1 Heparin Dosing Weight: 83kg  Vital Signs: Temp: 97.9 F (36.6 C) (07/13 1811) Temp Source: Oral (07/13 1811) BP: 137/88 (07/13 2100) Pulse Rate: 92 (07/13 2100)  Labs: Recent Labs    11/27/22 1212 11/27/22 2304  HGB 12.3  --   HCT 43.2  --   PLT 261  --   HEPARINUNFRC  --  0.54  CREATININE 0.73  --     Estimated Creatinine Clearance: 96.9 mL/min (by C-G formula based on SCr of 0.73 mg/dL).   Medical History: Past Medical History:  Diagnosis Date   Anemia    Arthritis    Asthma    Hypertension    Knee pain    Obesity     Assessment: 10 YOF presenting with CP and SOB, CT angio chest shows PE, suggestive RHS, she is not on anticoagulation PTA, CBC wnl  7/13 PM update:  Heparin level therapeutic   Goal of Therapy:  Heparin level 0.3-0.7 units/ml Monitor platelets by anticoagulation protocol: Yes   Plan:  Cont heparin 1500 units/hr Heparin level with AM labs  Abran Duke, PharmD, BCPS Clinical Pharmacist Phone: [phone]

## 2022-11-27 NOTE — ED Notes (Signed)
Pt up to the restroom, minimal assistance needed.

## 2022-11-27 NOTE — Progress Notes (Signed)
ANTICOAGULATION CONSULT NOTE - Initial Consult  Pharmacy Consult for heparin Indication: pulmonary embolus  No Known Allergies  Patient Measurements: Height: 5\' 2"  (157.5 cm) Weight: 131.1 kg (289 lb) IBW/kg (Calculated) : 50.1 Heparin Dosing Weight: 83kg  Vital Signs: Temp: 97.9 F (36.6 C) (07/13 1622) Temp Source: Oral (07/13 1622) BP: 105/66 (07/13 1545) Pulse Rate: 96 (07/13 1545)  Labs: Recent Labs    11/27/22 1212  HGB 12.3  HCT 43.2  PLT 261  CREATININE 0.73    Estimated Creatinine Clearance: 96.2 mL/min (by C-G formula based on SCr of 0.73 mg/dL).   Medical History: Past Medical History:  Diagnosis Date   Anemia    Arthritis    Asthma    Hypertension    Knee pain    Obesity     Assessment: 85 YOF presenting with CP and SOB, CT angio chest shows PE, suggestive RHS, she is not on anticoagulation PTA, CBC wnl  Goal of Therapy:  Heparin level 0.3-0.7 units/ml Monitor platelets by anticoagulation protocol: Yes   Plan:  Heparin 6000 units IV x 1, and gtt at 1500 units/hr F/u 6 hour heparin level F/u long term [facility] plan  Daylene Posey, PharmD, [facility] Clinical Pharmacist ED Pharmacist Phone # [phone] 11/27/2022 4:28 PM

## 2022-11-27 NOTE — ED Provider Notes (Signed)
[place] EMERGENCY DEPARTMENT AT [facility] Provider Note   CSN: [id number] Arrival date & time: 11/27/22  1106     History  Chief Complaint  Patient presents with   Chest Pain   Shortness of Breath    Emma Morris is a 62 y.o. female.  Patient has a history of COPD and pneumonia in May.  She complains of shortness of breath and coughing up some yellow sputum.  The history is provided by the patient and medical records. No language interpreter was used.  Shortness of Breath Severity:  Moderate Timing:  Constant Progression:  Worsening Chronicity:  New Context: activity   Relieved by:  Nothing Worsened by:  Nothing Ineffective treatments:  None tried Associated symptoms: no abdominal pain, no chest pain, no cough, no headaches and no rash        Home Medications Prior to Admission medications   Medication Sig Start Date End Date Taking? Authorizing Provider  amoxicillin-clavulanate (AUGMENTIN) 875-125 MG tablet Take 1 tablet by mouth 2 (two) times daily. For 7 days 08/12/22  Yes [provider]  cyclobenzaprine (FLEXERIL) 10 MG tablet Take 10 mg by mouth at bedtime as needed for muscle spasms. 09/24/22  Yes [provider]  potassium chloride (KLOR-CON) 10 MEQ tablet Take 10 mEq by mouth 2 (two) times daily. 07/08/22  Yes [provider]  acetaminophen (TYLENOL) 500 MG tablet Take 1,000 mg by mouth every 8 (eight) hours as needed for moderate pain. Patient not taking: Reported on 07/26/2022    [provider]  albuterol (PROVENTIL HFA;VENTOLIN HFA) 108 (90 Base) MCG/ACT inhaler Inhale 2 puffs into the lungs every 6 (six) hours as needed for wheezing or shortness of breath.    [provider]  amLODipine (NORVASC) 10 MG tablet Take 10 mg by mouth daily. 05/17/19   [provider]  cholecalciferol (VITAMIN D3) 25 MCG (1000 UNIT) tablet Take 1,000 Units by mouth daily.    [provider]   dextromethorphan (DELSYM) 30 MG/5ML liquid Take 5 mLs (30 mg total) by mouth 2 (two) times daily as needed for cough. 07/28/22   Rolly Salter, MD  fluticasone Aleda Grana) 50 MCG/ACT nasal spray Place into both nostrils. 01/27/21   [provider]  furosemide (LASIX) 40 MG tablet Take 1 tablet (40 mg total) by mouth daily. 07/28/22 07/28/23  Rolly Salter, MD  meloxicam (MOBIC) 7.5 MG tablet Take 1 tablet (7.5 mg total) by mouth daily. 07/28/22   Rolly Salter, MD  potassium chloride (KLOR-CON M) 10 MEQ tablet Take 1 tablet (10 mEq total) by mouth daily. 03/29/22   Johnson, Clanford L, MD  traMADol (ULTRAM) 50 MG tablet Take 1 tablet (50 mg total) by mouth every 12 (twelve) hours as needed. 09/28/22   Oliver Barre, MD      Allergies    Patient has no known allergies.    Review of Systems   Review of Systems  Constitutional:  Negative for appetite change and fatigue.  HENT:  Negative for congestion, ear discharge and sinus pressure.   Eyes:  Negative for discharge.  Respiratory:  Positive for shortness of breath. Negative for cough.   Cardiovascular:  Negative for chest pain.  Gastrointestinal:  Negative for abdominal pain and diarrhea.  Genitourinary:  Negative for frequency and hematuria.  Musculoskeletal:  Negative for back pain.  Skin:  Negative for rash.  Neurological:  Negative for seizures and headaches.  Psychiatric/Behavioral:  Negative for hallucinations.  Physical Exam Updated Vital Signs BP 105/66   Pulse 96   Temp 98.2 F (36.8 C)   Resp (!) 22   Ht 5\' 2"  (1.575 m)   Wt 131.1 kg   SpO2 98%   BMI 52.86 kg/m  Physical Exam Vitals and nursing note reviewed.  Constitutional:      Appearance: She is well-developed.  HENT:     Head: Normocephalic.     Nose: Nose normal.  Eyes:     General: No scleral icterus.    Conjunctiva/sclera: Conjunctivae normal.  Neck:     Thyroid: No thyromegaly.  Cardiovascular:     Rate and Rhythm: Normal rate and  regular rhythm.     Heart sounds: No murmur heard.    No friction rub. No gallop.  Pulmonary:     Breath sounds: No stridor. No wheezing or rales.  Chest:     Chest wall: No tenderness.  Abdominal:     General: There is no distension.     Tenderness: There is no abdominal tenderness. There is no rebound.  Musculoskeletal:        General: Normal range of motion.     Cervical back: Neck supple.  Lymphadenopathy:     Cervical: No cervical adenopathy.  Skin:    Findings: No erythema or rash.  Neurological:     Mental Status: She is alert and oriented to person, place, and time.     Motor: No abnormal muscle tone.     Coordination: Coordination normal.  Psychiatric:        Behavior: Behavior normal.     ED Results / Procedures / Treatments   Labs (all labs ordered are listed, but only abnormal results are displayed) Labs Reviewed  CBC WITH DIFFERENTIAL/PLATELET - Abnormal; Notable for the following components:      Result Value   MCH 24.9 (*)    MCHC 28.5 (*)    RDW 19.9 (*)    Neutro Abs 7.8 (*)    Abs Immature Granulocytes 0.17 (*)    All other components within normal limits  COMPREHENSIVE METABOLIC PANEL - Abnormal; Notable for the following components:   Chloride 95 (*)    CO2 36 (*)    Glucose, Bld 122 (*)    Calcium 8.7 (*)    Total Protein 8.7 (*)    Albumin 3.2 (*)    AST 11 (*)    All other components within normal limits  BRAIN NATRIURETIC PEPTIDE    EKG EKG Interpretation Date/Time:  Saturday November 27 2022 11:23:38 EDT Ventricular Rate:  111 PR Interval:  159 QRS Duration:  91 QT Interval:  331 QTC Calculation: 450 R Axis:   103  Text Interpretation: Sinus tachycardia Right axis deviation Low voltage, precordial leads Borderline repolarization abnormality Confirmed by Bethann Berkshire [phone]) on 11/27/2022 1:39:45 PM  Radiology CT Angio Chest PE W and/or Wo Contrast  Result Date: 11/27/2022 CLINICAL DATA:  Chest pain, shortness of breath EXAM: CT  ANGIOGRAPHY CHEST WITH CONTRAST TECHNIQUE: Multidetector CT imaging of the chest was performed using the standard protocol during bolus administration of intravenous contrast. Multiplanar CT image reconstructions and MIPs were obtained to evaluate the vascular anatomy. RADIATION DOSE REDUCTION: This exam was performed according to the departmental dose-optimization program which includes automated exposure control, adjustment of the mA and/or kV according to patient size and/or use of iterative reconstruction technique. CONTRAST:  75mL OMNIPAQUE IOHEXOL 350 MG/ML SOLN COMPARISON:  07/26/2022 FINDINGS: Cardiovascular: There is homogeneous enhancement in thoracic  aorta. There is ectasia of main pulmonary artery measuring 3.7 cm suggesting pulmonary arterial hypertension. In image 70 of series 5, there is filling defect in a segmental pulmonary artery branch in right upper lobe. Evaluation of peripheral pulmonary artery branches is limited by extensive patchy infiltrates and breathing motion artifacts. Evaluation of RV LV ratio is somewhat limited due to less than optimal contrast density in the right ventricular cavity. As far as seen, RV LV ratio appears to be more than 1 suggesting possible right heart strain. Heart is enlarged in size. Mediastinum/Nodes: There are slightly enlarged lymph nodes in mediastinum and hilar regions. There are enlarged lymph nodes in both axillary regions measuring up to 1.9 x 1.5 cm. Lungs/Pleura: Extensive patchy infiltrates are seen both lungs, more so in the lower lung fields. There is minimal right pleural effusion. There is no pneumothorax. Upper Abdomen: No acute findings are seen. Musculoskeletal: No acute findings are seen. Review of the MIP images confirms the above findings. IMPRESSION: Intraluminal filling defects are seen in segmental branches in right upper lobe suggesting PE with small thrombus burden. Evaluation of other peripheral pulmonary artery branches is limited by  extensive patchy infiltrates and breathing motion. No filling defects are seen in main pulmonary artery branches in the mediastinum. Main pulmonary artery measures 3.7 cm suggesting pulmonary arterial hypertension. There is possible prominence of right ventricle cavity in comparison to the left ventricular cavity suggesting right heart strain. Evaluation of size of right ventricular cavity is technically difficult due to less than optimal contrast density in the lumen. Extensive patchy infiltrates are seen in both lungs, more so in the lower lung fields suggesting multifocal atelectasis/pneumonia. Electronically Signed   By: Ernie Avena M.D.   On: 11/27/2022 15:32   DG Chest Port 1 View  Result Date: 11/27/2022 CLINICAL DATA:  Chest pain and shortness of breath. EXAM: PORTABLE CHEST 1 VIEW COMPARISON:  CT chest and chest x-ray dated July 26, 2022. FINDINGS: Unchanged cardiomegaly. Significantly decreased lung volumes with bibasilar opacities, likely atelectasis. No pneumothorax or large pleural effusion. No acute osseous abnormality. IMPRESSION: 1. Low lung volumes with bibasilar atelectasis. Electronically Signed   By: Obie Dredge M.D.   On: 11/27/2022 12:18    Procedures Procedures    Medications Ordered in ED Medications  cefTRIAXone (ROCEPHIN) 2 g in sodium chloride 0.9 % 100 mL IVPB (has no administration in time range)  azithromycin (ZITHROMAX) 500 mg in sodium chloride 0.9 % 250 mL IVPB (has no administration in time range)  sodium chloride 0.9 % bolus 500 mL (0 mLs Intravenous Stopped 11/27/22 1505)  HYDROcodone-acetaminophen (NORCO/VICODIN) 5-325 MG per tablet 1 tablet (1 tablet Oral Given 11/27/22 1351)  iohexol (OMNIPAQUE) 350 MG/ML injection 75 mL (75 mLs Intravenous Contrast Given 11/27/22 1437)    ED Course/ Medical Decision Making/ A&P  CRITICAL CARE Performed by: Bethann Berkshire Total critical care time: 40 minutes Critical care time was exclusive of separately billable  procedures and treating other patients. Critical care was necessary to treat or prevent imminent or life-threatening deterioration. Critical care was time spent personally by me on the following activities: development of treatment plan with patient and/or surrogate as well as nursing, discussions with consultants, evaluation of patient's response to treatment, examination of patient, obtaining history from patient or surrogate, ordering and performing treatments and interventions, ordering and review of laboratory studies, ordering and review of radiographic studies, pulse oximetry and re-evaluation of patient's condition.  Medical Decision Making Amount and/or Complexity of Data Reviewed Labs: ordered. Radiology: ordered.  Risk Prescription drug management. Decision regarding hospitalization.  This patient presents to the ED for concern of patient complains of cough and shortness of breath, this involves an extensive number of treatment options, and is a complaint that carries with it a high risk of complications and morbidity.  The differential diagnosis includes COPD, pneumonia   Co morbidities that complicate the patient evaluation  COPD   Additional history obtained:  Additional history obtained from patient External records from outside source obtained and reviewed including hospital records   Lab Tests:  I Ordered, and personally interpreted labs.  The pertinent results include: WBC 9.5   Imaging Studies ordered:  I ordered imaging studies including chest x-ray and CT chest I independently visualized and interpreted imaging which showed pneumonia and PE I agree with the radiologist interpretation   Cardiac Monitoring: / EKG:  The patient was maintained on a cardiac monitor.  I personally viewed and interpreted the cardiac monitored which showed an underlying rhythm of: Normal sinus rhythm   Consultations Obtained:  I requested  consultation with the hospitalist,  and discussed lab and imaging findings as well as pertinent plan - they recommend: Admit   Problem List / ED Course / Critical interventions / Medication management  COPD and pneumonia PE I ordered medication including antibiotics for pneumonia Reevaluation of the patient after these medicines showed that the patient improved I have reviewed the patients home medicines and have made adjustments as needed   Social Determinants of Health:  None   Test / Admission - Considered:  None none  Patient with pneumonia, hypoxia, small PE.  She will be admitted to medicine        Final Clinical Impression(s) / ED Diagnoses Final diagnoses:  None    Rx / DC Orders ED Discharge Orders     None         Bethann Berkshire, MD 11/29/22 1728

## 2022-11-27 NOTE — ED Triage Notes (Signed)
Pt to ER via EMs form home with c/o CP and SHOB starting approximately 4 hours PTA.  Pt was treated several months ago for PNA and was released on O2 2-4 L prn at that time.  Per EMS pt was on 4l  with sats 88-94%. Pt denies n/v, diaphoresis or other s/s at this time. Pt reports several day hx of cough productive of yellow sputum.

## 2022-11-27 NOTE — H&P (Addendum)
History and Physical    Patient: Emma Morris ZOX:[id number] DOB: 1960/08/27 DOA: 11/27/2022 DOS: the patient was seen and examined on 11/27/2022 PCP: Alvina Filbert, MD  Patient coming from: Home  Chief Complaint:  Chief Complaint  Patient presents with   Chest Pain   Shortness of Breath   HPI: Emma Morris is a 62 y.o. female with medical history significant of hypertension, obesity, asthma.  Patient was seen for chest pain or shortness of breath that started this morning.  Patient complains of feeling like something is sitting on her chest.  She gets short of breath with moving, as well as at rest.  She does have a cough that is productive with yellow sputum.  She does have COPD and is only on albuterol inhaler.  She was recently admitted in May for COPD and pneumonia.  She was brought to the ER via EMS.  With EMS, she was on 4 L nasal cannula with sats between 88 and 94%.  Patient had a CTA that showed multifocal pneumonia and segmental PE with small thrombus burden.  Review of Systems: As mentioned in the history of present illness. All other systems reviewed and are negative. Past Medical History:  Diagnosis Date   Anemia    Arthritis    Asthma    Hypertension    Knee pain    Obesity    Past Surgical History:  Procedure Laterality Date   CESAREAN SECTION     COLONOSCOPY WITH PROPOFOL N/A 11/09/2021   Procedure: COLONOSCOPY WITH PROPOFOL;  Surgeon: Lanelle Bal, DO;  Location: [facility];  Service: Endoscopy;  Laterality: N/A;  11:00am   DILITATION & CURRETTAGE/HYSTROSCOPY WITH THERMACHOICE ABLATION N/A 05/04/2013   Procedure: DILATATION & CURETTAGE/HYSTEROSCOPY WITH THERMACHOICE ABLATION;  Surgeon: Lazaro Arms, MD;  Location: [facility];  Service: Gynecology;  Laterality: N/A;  temperature:87 degrees; total therapy time:14 minutes and 5 seconds   ESOPHAGOGASTRODUODENOSCOPY (EGD) WITH PROPOFOL N/A 11/09/2021   Procedure: ESOPHAGOGASTRODUODENOSCOPY (EGD) WITH  PROPOFOL;  Surgeon: Lanelle Bal, DO;  Location: [facility];  Service: Endoscopy;  Laterality: N/A;   TONSILLECTOMY     Social History:  reports that she has never smoked. She has never used smokeless tobacco. She reports that she does not drink alcohol and does not use drugs.  No Known Allergies  Family History  Problem Relation Age of Onset   Cancer Mother    Kidney disease Father    Diabetes Father    Colon cancer Neg Hx     Prior to Admission medications   Medication Sig Start Date End Date Taking? Authorizing Provider  amoxicillin-clavulanate (AUGMENTIN) 875-125 MG tablet Take 1 tablet by mouth 2 (two) times daily. For 7 days 08/12/22  Yes [provider]  cyclobenzaprine (FLEXERIL) 10 MG tablet Take 10 mg by mouth at bedtime as needed for muscle spasms. 09/24/22  Yes [provider]  potassium chloride (KLOR-CON) 10 MEQ tablet Take 10 mEq by mouth 2 (two) times daily. 07/08/22  Yes [provider]  acetaminophen (TYLENOL) 500 MG tablet Take 1,000 mg by mouth every 8 (eight) hours as needed for moderate pain. Patient not taking: Reported on 07/26/2022    [provider]  albuterol (PROVENTIL HFA;VENTOLIN HFA) 108 (90 Base) MCG/ACT inhaler Inhale 2 puffs into the lungs every 6 (six) hours as needed for wheezing or shortness of breath.    [provider]  amLODipine (NORVASC) 10 MG tablet Take 10 mg by mouth daily. 05/17/19  [provider]  cholecalciferol (VITAMIN D3) 25 MCG (1000 UNIT) tablet Take 1,000 Units by mouth daily.    [provider]  dextromethorphan (DELSYM) 30 MG/5ML liquid Take 5 mLs (30 mg total) by mouth 2 (two) times daily as needed for cough. 07/28/22   Rolly Salter, MD  fluticasone Aleda Grana) 50 MCG/ACT nasal spray Place into both nostrils. 01/27/21   [provider]  furosemide (LASIX) 40 MG tablet Take 1 tablet (40 mg total) by mouth daily. 07/28/22 07/28/23  Rolly Salter, MD   meloxicam (MOBIC) 7.5 MG tablet Take 1 tablet (7.5 mg total) by mouth daily. 07/28/22   Rolly Salter, MD  potassium chloride (KLOR-CON M) 10 MEQ tablet Take 1 tablet (10 mEq total) by mouth daily. 03/29/22   Johnson, Clanford L, MD  traMADol (ULTRAM) 50 MG tablet Take 1 tablet (50 mg total) by mouth every 12 (twelve) hours as needed. 09/28/22   Oliver Barre, MD    Physical Exam: Vitals:   11/27/22 1801 11/27/22 1802 11/27/22 1803 11/27/22 1805  BP:      Pulse: 99 95  95  Resp:    17  Temp:      TempSrc:      SpO2:  90% 90% 90%  Weight:      Height:       General: Elderly female. Awake and alert and oriented x3. No acute cardiopulmonary distress.  HEENT: Normocephalic atraumatic.  Right and left ears normal in appearance.  Pupils equal, round, reactive to light. Extraocular muscles are intact. Sclerae anicteric and noninjected.  Moist mucosal membranes. No mucosal lesions.  Neck: Neck supple without lymphadenopathy. No carotid bruits. No masses palpated.  Cardiovascular: Regular rate with normal S1-S2 sounds. No murmurs, rubs, gallops auscultated. No JVD.  Respiratory: wheezing and   No accessory muscle use. Abdomen: Soft, nontender, nondistended. Active bowel sounds. No masses or hepatosplenomegaly  Skin: No rashes, lesions, or ulcerations.  Dry, warm to touch. 2+ dorsalis pedis and radial pulses. Musculoskeletal: No calf or leg pain. All major joints not erythematous nontender.  No upper or lower joint deformation.  Good ROM.  No contractures  Psychiatric: Intact judgment and insight. Pleasant and cooperative. Neurologic: No focal neurological deficits. Strength is 5/5 and symmetric in upper and lower extremities.  Cranial nerves II through XII are grossly intact.  Data Reviewed: Results for orders placed or performed during the hospital encounter of 11/27/22 (from the past 24 hour(s))  CBC with Differential     Status: Abnormal   Collection Time: 11/27/22 12:12 PM  Result Value  Ref Range   WBC 9.5 4.0 - 10.5 K/uL   RBC 4.93 3.87 - 5.11 MIL/uL   Hemoglobin 12.3 12.0 - 15.0 g/dL   HCT 16.1 09.6 - 04.5 %   MCV 87.6 80.0 - 100.0 fL   MCH 24.9 (L) 26.0 - 34.0 pg   MCHC 28.5 (L) 30.0 - 36.0 g/dL   RDW 40.9 (H) 81.1 - 91.4 %   Platelets 261 150 - 400 K/uL   nRBC 0.2 0.0 - 0.2 %   Neutrophils Relative % 82 %   Neutro Abs 7.8 (H) 1.7 - 7.7 K/uL   Lymphocytes Relative 10 %   Lymphs Abs 0.9 0.7 - 4.0 K/uL   Monocytes Relative 5 %   Monocytes Absolute 0.5 0.1 - 1.0 K/uL   Eosinophils Relative 1 %   Eosinophils Absolute 0.1 0.0 - 0.5 K/uL   Basophils Relative 0 %   Basophils  Absolute 0.0 0.0 - 0.1 K/uL   Immature Granulocytes 2 %   Abs Immature Granulocytes 0.17 (H) 0.00 - 0.07 K/uL  Comprehensive metabolic panel     Status: Abnormal   Collection Time: 11/27/22 12:12 PM  Result Value Ref Range   Sodium 136 135 - 145 mmol/L   Potassium 3.5 3.5 - 5.1 mmol/L   Chloride 95 (L) 98 - 111 mmol/L   CO2 36 (H) 22 - 32 mmol/L   Glucose, Bld 122 (H) 70 - 99 mg/dL   BUN 14 8 - 23 mg/dL   Creatinine, Ser 1.61 0.44 - 1.00 mg/dL   Calcium 8.7 (L) 8.9 - 10.3 mg/dL   Total Protein 8.7 (H) 6.5 - 8.1 g/dL   Albumin 3.2 (L) 3.5 - 5.0 g/dL   AST 11 (L) 15 - 41 U/L   ALT 11 0 - 44 U/L   Alkaline Phosphatase 47 38 - 126 U/L   Total Bilirubin 0.6 0.3 - 1.2 mg/dL   GFR, Estimated >09 >60 mL/min   Anion gap 5 5 - 15  Brain natriuretic peptide     Status: None   Collection Time: 11/27/22 12:12 PM  Result Value Ref Range   B Natriuretic Peptide 37.0 0.0 - 100.0 pg/mL    CT Angio Chest PE W and/or Wo Contrast  Result Date: 11/27/2022 CLINICAL DATA:  Chest pain, shortness of breath EXAM: CT ANGIOGRAPHY CHEST WITH CONTRAST TECHNIQUE: Multidetector CT imaging of the chest was performed using the standard protocol during bolus administration of intravenous contrast. Multiplanar CT image reconstructions and MIPs were obtained to evaluate the vascular anatomy. RADIATION DOSE REDUCTION:  This exam was performed according to the departmental dose-optimization program which includes automated exposure control, adjustment of the mA and/or kV according to patient size and/or use of iterative reconstruction technique. CONTRAST:  75mL OMNIPAQUE IOHEXOL 350 MG/ML SOLN COMPARISON:  07/26/2022 FINDINGS: Cardiovascular: There is homogeneous enhancement in thoracic aorta. There is ectasia of main pulmonary artery measuring 3.7 cm suggesting pulmonary arterial hypertension. In image 70 of series 5, there is filling defect in a segmental pulmonary artery branch in right upper lobe. Evaluation of peripheral pulmonary artery branches is limited by extensive patchy infiltrates and breathing motion artifacts. Evaluation of RV LV ratio is somewhat limited due to less than optimal contrast density in the right ventricular cavity. As far as seen, RV LV ratio appears to be more than 1 suggesting possible right heart strain. Heart is enlarged in size. Mediastinum/Nodes: There are slightly enlarged lymph nodes in mediastinum and hilar regions. There are enlarged lymph nodes in both axillary regions measuring up to 1.9 x 1.5 cm. Lungs/Pleura: Extensive patchy infiltrates are seen both lungs, more so in the lower lung fields. There is minimal right pleural effusion. There is no pneumothorax. Upper Abdomen: No acute findings are seen. Musculoskeletal: No acute findings are seen. Review of the MIP images confirms the above findings. IMPRESSION: Intraluminal filling defects are seen in segmental branches in right upper lobe suggesting PE with small thrombus burden. Evaluation of other peripheral pulmonary artery branches is limited by extensive patchy infiltrates and breathing motion. No filling defects are seen in main pulmonary artery branches in the mediastinum. Main pulmonary artery measures 3.7 cm suggesting pulmonary arterial hypertension. There is possible prominence of right ventricle cavity in comparison to the left  ventricular cavity suggesting right heart strain. Evaluation of size of right ventricular cavity is technically difficult due to less than optimal contrast density in the lumen. Extensive patchy  infiltrates are seen in both lungs, more so in the lower lung fields suggesting multifocal atelectasis/pneumonia. Electronically Signed   By: Ernie Avena M.D.   On: 11/27/2022 15:32   DG Chest Port 1 View  Result Date: 11/27/2022 CLINICAL DATA:  Chest pain and shortness of breath. EXAM: PORTABLE CHEST 1 VIEW COMPARISON:  CT chest and chest x-ray dated July 26, 2022. FINDINGS: Unchanged cardiomegaly. Significantly decreased lung volumes with bibasilar opacities, likely atelectasis. No pneumothorax or large pleural effusion. No acute osseous abnormality. IMPRESSION: 1. Low lung volumes with bibasilar atelectasis. Electronically Signed   By: Obie Dredge M.D.   On: 11/27/2022 12:18     Assessment and Plan: No notes have been filed under this hospital service. Service: Hospitalist  Principal Problem:   Acute on chronic respiratory failure with hypoxia (HCC) Active Problems:   HTN (hypertension)   Asthma, chronic obstructive, with acute exacerbation (HCC)   CAP (community acquired pneumonia)   PE (pulmonary thromboembolism) (HCC)  Acute on chronic respiratory failure with hypoxia Multifactorial Patient is very sick with high likelihood of decline. PE Start heparin drip CAP  Antibiotics: Rocephin and azithromycin Robitussin Blood cultures drawn in the emergency department Sputum cultures CBC tomorrow Strep and Legionella antigen by urine Lactic acid and procalcitonin COPD exacerbation Antibiotics: as above DuoNeb's every 6 scheduled with albuterol every 2 when necessary Continue inhaled steroids and LA bronchodilator Solu-Medrol 60 mg IV every 12 hours Mucinex   Advance Care Planning:   Code Status: Prior Full code confirmed by patient  Consults:  Family Communication: sister  present  Severity of Illness: The appropriate patient status for this patient is INPATIENT. Inpatient status is judged to be reasonable and necessary in order to provide the required intensity of service to ensure the patient's safety. The patient's presenting symptoms, physical exam findings, and initial radiographic and laboratory data in the context of their chronic comorbidities is felt to place them at high risk for further clinical deterioration. Furthermore, it is not anticipated that the patient will be medically stable for discharge from the hospital within 2 midnights of admission.   * I certify that at the point of admission it is my clinical judgment that the patient will require inpatient hospital care spanning beyond 2 midnights from the point of admission due to high intensity of service, high risk for further deterioration and high frequency of surveillance required.*  Author: Levie Heritage, DO 11/27/2022 6:08 PM  For on call review [email or web address].

## 2022-11-28 ENCOUNTER — Inpatient Hospital Stay (HOSPITAL_COMMUNITY): Payer: 59

## 2022-11-28 DIAGNOSIS — I2602 Saddle embolus of pulmonary artery with acute cor pulmonale: Secondary | ICD-10-CM | POA: Diagnosis not present

## 2022-11-28 DIAGNOSIS — J9621 Acute and chronic respiratory failure with hypoxia: Secondary | ICD-10-CM

## 2022-11-28 LAB — URINALYSIS, ROUTINE W REFLEX MICROSCOPIC
Bilirubin Urine: NEGATIVE
Glucose, UA: NEGATIVE mg/dL
Hgb urine dipstick: NEGATIVE
Ketones, ur: NEGATIVE mg/dL
Leukocytes,Ua: NEGATIVE
Nitrite: NEGATIVE
Protein, ur: 30 mg/dL — AB
Specific Gravity, Urine: >1.046 — ABNORMAL HIGH (ref 1.005–1.030)
pH: 5 (ref 5.0–8.0)

## 2022-11-28 LAB — CBC
HCT: 42.8 % (ref 36.0–46.0)
Hemoglobin: 11.9 g/dL — ABNORMAL LOW (ref 12.0–15.0)
MCH: 24.9 pg — ABNORMAL LOW (ref 26.0–34.0)
MCHC: 27.8 g/dL — ABNORMAL LOW (ref 30.0–36.0)
MCV: 89.5 fL (ref 80.0–100.0)
Platelets: 251 10*3/uL (ref 150–400)
RBC: 4.78 MIL/uL (ref 3.87–5.11)
RDW: 19.3 % — ABNORMAL HIGH (ref 11.5–15.5)
WBC: 14.6 10*3/uL — ABNORMAL HIGH (ref 4.0–10.5)
nRBC: 0.1 % (ref 0.0–0.2)

## 2022-11-28 LAB — BASIC METABOLIC PANEL
Anion gap: 9 (ref 5–15)
BUN: 20 mg/dL (ref 8–23)
CO2: 33 mmol/L — ABNORMAL HIGH (ref 22–32)
Calcium: 8.3 mg/dL — ABNORMAL LOW (ref 8.9–10.3)
Chloride: 93 mmol/L — ABNORMAL LOW (ref 98–111)
Creatinine, Ser: 0.99 mg/dL (ref 0.44–1.00)
GFR, Estimated: >60 mL/min (ref >60)
Glucose, Bld: 131 mg/dL — ABNORMAL HIGH (ref 70–99)
Potassium: 4.1 mmol/L (ref 3.5–5.1)
Sodium: 135 mmol/L (ref 135–145)

## 2022-11-28 LAB — ECHOCARDIOGRAM COMPLETE
Area-P 1/2: 3.66 cm2
Calc EF: 68.8 %
Height: 62 in
S' Lateral: 2.7 cm
Single Plane A2C EF: 72.3 %
Single Plane A4C EF: 62.9 %
Weight: 4673.75 oz

## 2022-11-28 LAB — STREP PNEUMONIAE URINARY ANTIGEN: Strep Pneumo Urinary Antigen: NEGATIVE

## 2022-11-28 LAB — HEPARIN LEVEL (UNFRACTIONATED): Heparin Unfractionated: 0.43 IU/mL (ref 0.30–0.70)

## 2022-11-28 MED ORDER — SALINE SPRAY 0.65 % NA SOLN
1.0000 | NASAL | Status: DC | PRN
  Administered 2022-11-28: 1 via NASAL
  Filled 2022-11-28 (×2): qty 44

## 2022-11-28 MED ORDER — LACTATED RINGERS IV SOLN: INTRAVENOUS | Status: AC

## 2022-11-28 MED ORDER — OXYCODONE-ACETAMINOPHEN 5-325 MG PO TABS
1.0000 | ORAL_TABLET | ORAL | Status: DC | PRN
  Administered 2022-11-28 – 2022-12-02 (×4): 1 via ORAL
  Filled 2022-11-28 (×4): qty 1

## 2022-11-28 NOTE — Plan of Care (Signed)

## 2022-11-28 NOTE — Progress Notes (Signed)
ANTICOAGULATION CONSULT NOTE   Pharmacy Consult for heparin Indication: pulmonary embolus  No Known Allergies  Patient Measurements: Height: 5\' 2"  (157.5 cm) Weight: 132.5 kg (292 lb 1.8 oz) IBW/kg (Calculated) : 50.1 Heparin Dosing Weight: 83kg  Labs: Recent Labs    11/27/22 1212 11/27/22 2304 11/28/22 0550  HGB 12.3  --  11.9*  HCT 43.2  --  42.8  PLT 261  --  251  HEPARINUNFRC  --  0.54 0.43  CREATININE 0.73  --  0.99    Estimated Creatinine Clearance: 78.3 mL/min (by C-G formula based on SCr of 0.99 mg/dL).  Assessment: 40 YOF presenting with CP and SOB, CT angio chest shows PE, suggestive RHS, she is not on anticoagulation PTA, CBC wnl  7/14 am: Heparin is therapeutic  Goal of Therapy:  Heparin level 0.3-0.7 units/ml Monitor platelets by anticoagulation protocol: Yes   Plan:  Cont heparin 1500 units/hr Heparin level with AM labs  Caryl Asp, PharmD Clinical Pharmacist 11/28/2022 10:01 AM

## 2022-11-28 NOTE — Progress Notes (Signed)
  Echocardiogram 2D Echocardiogram has been performed.  Emma Morris 11/28/2022, 2:17 PM

## 2022-11-28 NOTE — Progress Notes (Signed)
PROGRESS NOTE    Emma Morris  ZOX:[id number] DOB: 05-01-1961 DOA: 11/27/2022 PCP: Alvina Filbert, MD   Brief Narrative:    Emma Morris is a 62 y.o. female with medical history significant of hypertension, obesity, asthma.  Patient was seen for chest pain or shortness of breath that started this morning.  She was admitted for acute on chronic hypoxemic respiratory failure noted to be multifactorial in the setting of PE as well as community-acquired pneumonia and associated acute COPD exacerbation.  Assessment & Plan:   Principal Problem:   Acute on chronic respiratory failure with hypoxia (HCC) Active Problems:   HTN (hypertension)   Asthma, chronic obstructive, with acute exacerbation (HCC)   CAP (community acquired pneumonia)   PE (pulmonary thromboembolism) (HCC)  Assessment and Plan:   Acute on chronic respiratory failure with hypoxia Multifactorial Patient is very sick with high likelihood of decline. Currently on 6 L nasal cannula, wean to baseline 1-2 L PE with RV strain Continue heparin drip Some RV strain noted and 2D echocardiogram ordered CAP  Antibiotics: Rocephin and azithromycin Robitussin Blood cultures drawn in the emergency department Sputum cultures CBC tomorrow Strep and Legionella antigen by urine Lactic acid 1.1 and procalcitonin less than 0.10 COPD exacerbation Antibiotics: as above DuoNeb's every 6 scheduled with albuterol every 2 when necessary Continue inhaled steroids and LA bronchodilator Solu-Medrol 60 mg IV every 12 hours Mucinex 5.   Morbid obesity    DVT prophylaxis: Heparin drip Code Status: Full Family Communication: None at bedside Disposition Plan:  Status is: Inpatient Remains inpatient appropriate because: Need for IV medications  Consultants:  None  Procedures:  None  Antimicrobials:  Anti-infectives (From admission, onward)    Start     Dose/Rate Route Frequency Ordered Stop   11/28/22 1600   cefTRIAXone (ROCEPHIN) 2 g in sodium chloride 0.9 % 100 mL IVPB        2 g 200 mL/hr over 30 Minutes Intravenous Every 24 hours 11/27/22 2039     11/28/22 1600  azithromycin (ZITHROMAX) 500 mg in sodium chloride 0.9 % 250 mL IVPB        500 mg 250 mL/hr over 60 Minutes Intravenous  Once 11/27/22 2039     11/27/22 1615  cefTRIAXone (ROCEPHIN) 2 g in sodium chloride 0.9 % 100 mL IVPB        2 g 200 mL/hr over 30 Minutes Intravenous  Once 11/27/22 1602 11/27/22 1818   11/27/22 1615  azithromycin (ZITHROMAX) 500 mg in sodium chloride 0.9 % 250 mL IVPB        500 mg 250 mL/hr over 60 Minutes Intravenous  Once 11/27/22 1602 11/27/22 1818      Subjective: Patient seen and evaluated today with no new acute complaints or concerns. No acute concerns or events noted overnight.  She denies any chest pain and states that her breathing is better.  She has some tenderness to palpation over her lower extremities.  Objective: Vitals:   11/28/22 0700 11/28/22 0720 11/28/22 0802 11/28/22 0829  BP: 124/83  (!) 138/97 (!) 138/97  Pulse: 81  87   Resp: 17  19   Temp:  98 F (36.7 C)    TempSrc:  Oral    SpO2: 95%  96%   Weight:      Height:        Intake/Output Summary (Last 24 hours) at 11/28/2022 0937 Last data filed at 11/28/2022 0553 Gross per 24 hour  Intake 1678.01 ml  Output 250  ml  Net 1428.01 ml   Filed Weights   11/27/22 1124 11/27/22 1745 11/28/22 0500  Weight: 131.1 kg 132.6 kg 132.5 kg    Examination:  General exam: Appears calm and comfortable, morbidly obese Respiratory system: Clear to auscultation. Respiratory effort normal.  6 L nasal cannula Cardiovascular system: S1 & S2 heard, RRR.  Gastrointestinal system: Abdomen is soft Central nervous system: Alert and awake Extremities: No edema Skin: No significant lesions noted Psychiatry: Flat affect.    Data Reviewed: I have personally reviewed following labs and imaging studies  CBC: Recent Labs  Lab 11/27/22 1212  11/28/22 0550  WBC 9.5 14.6*  NEUTROABS 7.8*  --   HGB 12.3 11.9*  HCT 43.2 42.8  MCV 87.6 89.5  PLT 261 251   Basic Metabolic Panel: Recent Labs  Lab 11/27/22 1212 11/28/22 0550  NA 136 135  K 3.5 4.1  CL 95* 93*  CO2 36* 33*  GLUCOSE 122* 131*  BUN 14 20  CREATININE 0.73 0.99  CALCIUM 8.7* 8.3*   GFR: Estimated Creatinine Clearance: 78.3 mL/min (by C-G formula based on SCr of 0.99 mg/dL). Liver Function Tests: Recent Labs  Lab 11/27/22 1212  AST 11*  ALT 11  ALKPHOS 47  BILITOT 0.6  PROT 8.7*  ALBUMIN 3.2*   No results for input(s): "LIPASE", "AMYLASE" in the last 168 hours. No results for input(s): "AMMONIA" in the last 168 hours. Coagulation Profile: No results for input(s): "INR", "PROTIME" in the last 168 hours. Cardiac Enzymes: No results for input(s): "CKTOTAL", "CKMB", "CKMBINDEX", "TROPONINI" in the last 168 hours. BNP (last 3 results) No results for input(s): "PROBNP" in the last 8760 hours. HbA1C: No results for input(s): "HGBA1C" in the last 72 hours. CBG: No results for input(s): "GLUCAP" in the last 168 hours. Lipid Profile: No results for input(s): "CHOL", "HDL", "LDLCALC", "TRIG", "CHOLHDL", "LDLDIRECT" in the last 72 hours. Thyroid Function Tests: No results for input(s): "TSH", "T4TOTAL", "FREET4", "T3FREE", "THYROIDAB" in the last 72 hours. Anemia Panel: No results for input(s): "VITAMINB12", "FOLATE", "FERRITIN", "TIBC", "IRON", "RETICCTPCT" in the last 72 hours. Sepsis Labs: Recent Labs  Lab 11/27/22 1833 11/27/22 2007  PROCALCITON <0.10  --   LATICACIDVEN 1.1 1.4    Recent Results (from the past 240 hour(s))  Culture, blood (Routine X 2) w Reflex to ID Panel     Status: None (Preliminary result)   Collection Time: 11/27/22  6:15 PM   Specimen: BLOOD RIGHT HAND  Result Value Ref Range Status   Specimen Description BLOOD RIGHT HAND  Final   Special Requests   Final    BOTTLES DRAWN AEROBIC AND ANAEROBIC Blood Culture adequate  volume   Culture   Final    NO GROWTH < 24 HOURS Performed at [facility], [address] [place], [place] [address]    Report Status PENDING  Incomplete  Culture, blood (Routine X 2) w Reflex to ID Panel     Status: None (Preliminary result)   Collection Time: 11/27/22  6:33 PM   Specimen: BLOOD RIGHT HAND  Result Value Ref Range Status   Specimen Description BLOOD RIGHT HAND  Final   Special Requests   Final    BOTTLES DRAWN AEROBIC AND ANAEROBIC Blood Culture adequate volume   Culture   Final    NO GROWTH < 24 HOURS Performed at [facility], [address], [place], [place] [address]    Report Status PENDING  Incomplete  MRSA Next Gen by PCR, Nasal  Status: None   Collection Time: 11/27/22  6:50 PM   Specimen: Nasal Mucosa; Nasal Swab  Result Value Ref Range Status   MRSA by PCR Next Gen NOT DETECTED NOT DETECTED Final    Comment: (NOTE) The GeneXpert MRSA Assay (FDA approved for NASAL specimens only), is one component of a comprehensive MRSA colonization surveillance program. It is not intended to diagnose MRSA infection nor to guide or monitor treatment for MRSA infections. Test performance is not FDA approved in patients less than 59 years old. Performed at [facility], [address] [place], [place] [address]          Radiology Studies: CT Angio Chest PE W and/or Wo Contrast  Result Date: 11/27/2022 CLINICAL DATA:  Chest pain, shortness of breath EXAM: CT ANGIOGRAPHY CHEST WITH CONTRAST TECHNIQUE: Multidetector CT imaging of the chest was performed using the standard protocol during bolus administration of intravenous contrast. Multiplanar CT image reconstructions and MIPs were obtained to evaluate the vascular anatomy. RADIATION DOSE REDUCTION: This exam was performed according to the departmental dose-optimization program which includes automated exposure control, adjustment of the mA and/or kV according to patient size and/or use of iterative reconstruction  technique. CONTRAST:  75mL OMNIPAQUE IOHEXOL 350 MG/ML SOLN COMPARISON:  07/26/2022 FINDINGS: Cardiovascular: There is homogeneous enhancement in thoracic aorta. There is ectasia of main pulmonary artery measuring 3.7 cm suggesting pulmonary arterial hypertension. In image 70 of series 5, there is filling defect in a segmental pulmonary artery branch in right upper lobe. Evaluation of peripheral pulmonary artery branches is limited by extensive patchy infiltrates and breathing motion artifacts. Evaluation of RV LV ratio is somewhat limited due to less than optimal contrast density in the right ventricular cavity. As far as seen, RV LV ratio appears to be more than 1 suggesting possible right heart strain. Heart is enlarged in size. Mediastinum/Nodes: There are slightly enlarged lymph nodes in mediastinum and hilar regions. There are enlarged lymph nodes in both axillary regions measuring up to 1.9 x 1.5 cm. Lungs/Pleura: Extensive patchy infiltrates are seen both lungs, more so in the lower lung fields. There is minimal right pleural effusion. There is no pneumothorax. Upper Abdomen: No acute findings are seen. Musculoskeletal: No acute findings are seen. Review of the MIP images confirms the above findings. IMPRESSION: Intraluminal filling defects are seen in segmental branches in right upper lobe suggesting PE with small thrombus burden. Evaluation of other peripheral pulmonary artery branches is limited by extensive patchy infiltrates and breathing motion. No filling defects are seen in main pulmonary artery branches in the mediastinum. Main pulmonary artery measures 3.7 cm suggesting pulmonary arterial hypertension. There is possible prominence of right ventricle cavity in comparison to the left ventricular cavity suggesting right heart strain. Evaluation of size of right ventricular cavity is technically difficult due to less than optimal contrast density in the lumen. Extensive patchy infiltrates are seen in  both lungs, more so in the lower lung fields suggesting multifocal atelectasis/pneumonia. Electronically Signed   By: Ernie Avena M.D.   On: 11/27/2022 15:32   DG Chest Port 1 View  Result Date: 11/27/2022 CLINICAL DATA:  Chest pain and shortness of breath. EXAM: PORTABLE CHEST 1 VIEW COMPARISON:  CT chest and chest x-ray dated July 26, 2022. FINDINGS: Unchanged cardiomegaly. Significantly decreased lung volumes with bibasilar opacities, likely atelectasis. No pneumothorax or large pleural effusion. No acute osseous abnormality. IMPRESSION: 1. Low lung volumes with bibasilar atelectasis. Electronically Signed   By: Vickki Hearing.D.  On: 11/27/2022 12:18        Scheduled Meds:  amLODipine  10 mg Oral Daily   Chlorhexidine Gluconate Cloth  6 each Topical Q0600   methylPREDNISolone (SOLU-MEDROL) injection  60 mg Intravenous Q12H   Continuous Infusions:  azithromycin     cefTRIAXone (ROCEPHIN)  IV     heparin 1,500 Units/hr (11/28/22 0629)     LOS: 1 day    Time spent: 35 minutes    Jennise Both Hoover Brunette, DO [facility]  If 7PM-7AM, please contact night-coverage [email or web address] 11/28/2022, 9:37 AM

## 2022-11-28 NOTE — Progress Notes (Signed)
Urine output since 7am has only been thus far. Bladder scan done x2 with less than results. Dr Sherryll Burger made aware and IV fluids ordered. Will continue to monitor.

## 2022-11-28 NOTE — Progress Notes (Signed)
Urine noted to be tea colored. Notified Dr Sherryll Burger. Patient denies any abdominal pain or discomfort at this time.

## 2022-11-29 DIAGNOSIS — J9621 Acute and chronic respiratory failure with hypoxia: Secondary | ICD-10-CM | POA: Diagnosis not present

## 2022-11-29 LAB — CBC
HCT: 39.7 % (ref 36.0–46.0)
Hemoglobin: 11.3 g/dL — ABNORMAL LOW (ref 12.0–15.0)
MCH: 24.9 pg — ABNORMAL LOW (ref 26.0–34.0)
MCHC: 28.5 g/dL — ABNORMAL LOW (ref 30.0–36.0)
MCV: 87.6 fL (ref 80.0–100.0)
Platelets: 235 10*3/uL (ref 150–400)
RBC: 4.53 MIL/uL (ref 3.87–5.11)
RDW: 19.3 % — ABNORMAL HIGH (ref 11.5–15.5)
WBC: 13.9 10*3/uL — ABNORMAL HIGH (ref 4.0–10.5)
nRBC: 0 % (ref 0.0–0.2)

## 2022-11-29 LAB — BASIC METABOLIC PANEL
Anion gap: 9 (ref 5–15)
BUN: 33 mg/dL — ABNORMAL HIGH (ref 8–23)
CO2: 33 mmol/L — ABNORMAL HIGH (ref 22–32)
Calcium: 8.6 mg/dL — ABNORMAL LOW (ref 8.9–10.3)
Chloride: 92 mmol/L — ABNORMAL LOW (ref 98–111)
Creatinine, Ser: 0.97 mg/dL (ref 0.44–1.00)
GFR, Estimated: >60 mL/min (ref >60)
Glucose, Bld: 117 mg/dL — ABNORMAL HIGH (ref 70–99)
Potassium: 4.5 mmol/L (ref 3.5–5.1)
Sodium: 134 mmol/L — ABNORMAL LOW (ref 135–145)

## 2022-11-29 LAB — MAGNESIUM: Magnesium: 2.5 mg/dL — ABNORMAL HIGH (ref 1.7–2.4)

## 2022-11-29 LAB — HEPARIN LEVEL (UNFRACTIONATED)
Heparin Unfractionated: 0.25 IU/mL — ABNORMAL LOW (ref 0.30–0.70)
Heparin Unfractionated: 0.58 IU/mL (ref 0.30–0.70)

## 2022-11-29 MED ORDER — SODIUM CHLORIDE 0.9 % IV SOLN
500.0000 mg | INTRAVENOUS | Status: DC
  Administered 2022-11-29 – 2022-12-01 (×3): 500 mg via INTRAVENOUS
  Filled 2022-11-29 (×3): qty 5

## 2022-11-29 MED ORDER — HEPARIN BOLUS VIA INFUSION
1500.0000 [IU] | Freq: Once | INTRAVENOUS | Status: AC
  Administered 2022-11-29: 1500 [IU] via INTRAVENOUS
  Filled 2022-11-29: qty 1500

## 2022-11-29 NOTE — Progress Notes (Signed)
1915 patient alert x4 on 3L [place] able to make all needs known

## 2022-11-29 NOTE — Progress Notes (Signed)
TOC Screen Over the weekend TOC following  Transition of Care Department [facility]) has reviewed patient and no TOC needs have been identified at this time. We will continue to monitor patient advancement through interdisciplinary progression rounds. If new patient transition needs arise, please place a TOC consult.

## 2022-11-29 NOTE — Progress Notes (Signed)
ANTICOAGULATION CONSULT NOTE   Pharmacy Consult for heparin Indication: pulmonary embolus  No Known Allergies  Patient Measurements: Height: 5\' 2"  (157.5 cm) Weight: 132.5 kg (292 lb 1.8 oz) IBW/kg (Calculated) : 50.1 Heparin Dosing Weight: 83kg  Labs: Recent Labs    11/27/22 1212 11/27/22 2304 11/28/22 0550 11/29/22 0406 11/29/22 1635  HGB 12.3  --  11.9* 11.3*  --   HCT 43.2  --  42.8 39.7  --   PLT 261  --  251 235  --   HEPARINUNFRC  --    < > 0.43 0.25* 0.58  CREATININE 0.73  --  0.99 0.97  --    < > = values in this interval not displayed.    Estimated Creatinine Clearance: 79.9 mL/min (by C-G formula based on SCr of 0.97 mg/dL).  Assessment: 20 YOF presenting with CP and SOB, CT angio chest shows PE, suggestive RHS, she is not on anticoagulation PTA, CBC wnl  HL 0.58- therapeutic   Goal of Therapy:  Heparin level 0.3-0.7 units/ml Monitor platelets by anticoagulation protocol: Yes   Plan:  Continue heparin infusion at 1700 units/hr Heparin level daily  Judeth Cornfield, PharmD Clinical Pharmacist 11/29/2022 5:19 PM

## 2022-11-29 NOTE — Progress Notes (Addendum)
ANTICOAGULATION CONSULT NOTE   Pharmacy Consult for heparin Indication: pulmonary embolus  No Known Allergies  Patient Measurements: Height: 5\' 2"  (157.5 cm) Weight: 132.5 kg (292 lb 1.8 oz) IBW/kg (Calculated) : 50.1 Heparin Dosing Weight: 83kg  Labs: Recent Labs    11/27/22 1212 11/27/22 2304 11/28/22 0550 11/29/22 0406  HGB 12.3  --  11.9* 11.3*  HCT 43.2  --  42.8 39.7  PLT 261  --  251 235  HEPARINUNFRC  --  0.54 0.43 0.25*  CREATININE 0.73  --  0.99 0.97    Estimated Creatinine Clearance: 79.9 mL/min (by C-G formula based on SCr of 0.97 mg/dL).  Assessment: [name] presenting with CP and SOB, CT angio chest shows PE, suggestive RHS, she is not on anticoagulation PTA, CBC wnl  HL 0.25- slightly subtherapeutic   Goal of Therapy:  Heparin level 0.3-0.7 units/ml Monitor platelets by anticoagulation protocol: Yes   Plan:  Rebolus 1500 units IV x 1 Increase heparin infusion to 1700 units/hr Heparin level in 6 hours and daily  Emma Morris, PharmD Clinical Pharmacist 11/29/2022 8:04 AM

## 2022-11-29 NOTE — Plan of Care (Signed)

## 2022-11-29 NOTE — Progress Notes (Signed)
PROGRESS NOTE    Emma Morris  XBJ:[id number] DOB: March 24, 1961 DOA: 11/27/2022 PCP: Alvina Filbert, MD   Brief Narrative:    Emma Morris is a 62 y.o. female with medical history significant of hypertension, obesity, asthma.  Patient was seen for chest pain or shortness of breath that started this morning.  She was admitted for acute on chronic hypoxemic respiratory failure noted to be multifactorial in the setting of PE as well as community-acquired pneumonia and associated acute COPD exacerbation.  Assessment & Plan:   Principal Problem:   Acute on chronic respiratory failure with hypoxia (HCC) Active Problems:   HTN (hypertension)   Asthma, chronic obstructive, with acute exacerbation (HCC)   CAP (community acquired pneumonia)   PE (pulmonary thromboembolism) (HCC)  Assessment and Plan:   Acute on chronic respiratory failure with hypoxia Multifactorial Patient is very sick with high likelihood of decline. Currently on 4 L nasal cannula, wean to baseline 1-2 L Okay for transfer to telemetry PE with RV strain Continue heparin drip Some RV strain noted on CT scan, however this was not seen on 2D echocardiogram.  LVEF 65 to 70% with no other acute abnormalities noted. Bilateral DVT studies negative CAP  Antibiotics: Rocephin and azithromycin Robitussin Blood cultures with no growth noted Sputum cultures CBC tomorrow Strep pneumonia negative and Legionella pending Lactic acid 1.1 and procalcitonin less than 0.10 COPD exacerbation Antibiotics: as above DuoNeb's every 6 scheduled with albuterol every 2 when necessary Continue inhaled steroids and LA bronchodilator Solu-Medrol 60 mg IV every 12 hours and wean further with diminished use of oxygen Mucinex 5.   Morbid obesity    DVT prophylaxis: Heparin drip Code Status: Full Family Communication: None at bedside Disposition Plan:  Status is: Inpatient Remains inpatient appropriate because: Need for IV  medications  Consultants:  None  Procedures:  None  Antimicrobials:  Anti-infectives (From admission, onward)    Start     Dose/Rate Route Frequency Ordered Stop   11/28/22 1600  cefTRIAXone (ROCEPHIN) 2 g in sodium chloride 0.9 % 100 mL IVPB        2 g 200 mL/hr over 30 Minutes Intravenous Every 24 hours 11/27/22 2039     11/28/22 1600  azithromycin (ZITHROMAX) 500 mg in sodium chloride 0.9 % 250 mL IVPB        500 mg 250 mL/hr over 60 Minutes Intravenous  Once 11/27/22 2039 11/28/22 1644   11/27/22 1615  cefTRIAXone (ROCEPHIN) 2 g in sodium chloride 0.9 % 100 mL IVPB        2 g 200 mL/hr over 30 Minutes Intravenous  Once 11/27/22 1602 11/27/22 1818   11/27/22 1615  azithromycin (ZITHROMAX) 500 mg in sodium chloride 0.9 % 250 mL IVPB        500 mg 250 mL/hr over 60 Minutes Intravenous  Once 11/27/22 1602 11/27/22 1818      Subjective: Patient seen and evaluated today with no new acute complaints or concerns. No acute concerns or events noted overnight.  She is quite sleepy this morning.  Objective: Vitals:   11/29/22 0731 11/29/22 0800 11/29/22 0900 11/29/22 0920  BP:  (!) 119/59 104/67 124/73  Pulse:  96 78 86  Resp:  (!) 26 20 19   Temp: 98 F (36.7 C)     TempSrc: Oral     SpO2: 96% 96% 97% 98%  Weight:      Height:        Intake/Output Summary (Last 24 hours) at 11/29/2022 1058  Last data filed at 11/29/2022 0917 Gross per 24 hour  Intake 1455.11 ml  Output 2000 ml  Net -544.89 ml   Filed Weights   11/27/22 1124 11/27/22 1745 11/28/22 0500  Weight: 131.1 kg 132.6 kg 132.5 kg    Examination:  General exam: Appears calm and comfortable, morbidly obese Respiratory system: Clear to auscultation. Respiratory effort normal.  4 L nasal cannula Cardiovascular system: S1 & S2 heard, RRR.  Gastrointestinal system: Abdomen is soft Central nervous system: Somnolent Extremities: No edema Skin: No significant lesions noted Psychiatry: Flat affect.    Data  Reviewed: I have personally reviewed following labs and imaging studies  CBC: Recent Labs  Lab 11/27/22 1212 11/28/22 0550 11/29/22 0406  WBC 9.5 14.6* 13.9*  NEUTROABS 7.8*  --   --   HGB 12.3 11.9* 11.3*  HCT 43.2 42.8 39.7  MCV 87.6 89.5 87.6  PLT 261 251 235   Basic Metabolic Panel: Recent Labs  Lab 11/27/22 1212 11/28/22 0550 11/29/22 0406  NA 136 135 134*  K 3.5 4.1 4.5  CL 95* 93* 92*  CO2 36* 33* 33*  GLUCOSE 122* 131* 117*  BUN 14 20 33*  CREATININE 0.73 0.99 0.97  CALCIUM 8.7* 8.3* 8.6*  MG  --   --  2.5*   GFR: Estimated Creatinine Clearance: 79.9 mL/min (by C-G formula based on SCr of 0.97 mg/dL). Liver Function Tests: Recent Labs  Lab 11/27/22 1212  AST 11*  ALT 11  ALKPHOS 47  BILITOT 0.6  PROT 8.7*  ALBUMIN 3.2*   No results for input(s): "LIPASE", "AMYLASE" in the last 168 hours. No results for input(s): "AMMONIA" in the last 168 hours. Coagulation Profile: No results for input(s): "INR", "PROTIME" in the last 168 hours. Cardiac Enzymes: No results for input(s): "CKTOTAL", "CKMB", "CKMBINDEX", "TROPONINI" in the last 168 hours. BNP (last 3 results) No results for input(s): "PROBNP" in the last 8760 hours. HbA1C: No results for input(s): "HGBA1C" in the last 72 hours. CBG: No results for input(s): "GLUCAP" in the last 168 hours. Lipid Profile: No results for input(s): "CHOL", "HDL", "LDLCALC", "TRIG", "CHOLHDL", "LDLDIRECT" in the last 72 hours. Thyroid Function Tests: No results for input(s): "TSH", "T4TOTAL", "FREET4", "T3FREE", "THYROIDAB" in the last 72 hours. Anemia Panel: No results for input(s): "VITAMINB12", "FOLATE", "FERRITIN", "TIBC", "IRON", "RETICCTPCT" in the last 72 hours. Sepsis Labs: Recent Labs  Lab 11/27/22 1833 11/27/22 2007  PROCALCITON <0.10  --   LATICACIDVEN 1.1 1.4    Recent Results (from the past 240 hour(s))  Culture, blood (Routine X 2) w Reflex to ID Panel     Status: None (Preliminary result)    Collection Time: 11/27/22  6:15 PM   Specimen: BLOOD RIGHT HAND  Result Value Ref Range Status   Specimen Description BLOOD RIGHT HAND  Final   Special Requests   Final    BOTTLES DRAWN AEROBIC AND ANAEROBIC Blood Culture adequate volume   Culture   Final    NO GROWTH 2 DAYS Performed at [facility], [address] [place], [place] [address]    Report Status PENDING  Incomplete  Culture, blood (Routine X 2) w Reflex to ID Panel     Status: None (Preliminary result)   Collection Time: 11/27/22  6:33 PM   Specimen: BLOOD RIGHT HAND  Result Value Ref Range Status   Specimen Description BLOOD RIGHT HAND  Final   Special Requests   Final    BOTTLES DRAWN AEROBIC AND ANAEROBIC Blood Culture adequate volume  Culture   Final    NO GROWTH 2 DAYS Performed at [facility], [address] [place], [place] [address]    Report Status PENDING  Incomplete  MRSA Next Gen by PCR, Nasal     Status: None   Collection Time: 11/27/22  6:50 PM   Specimen: Nasal Mucosa; Nasal Swab  Result Value Ref Range Status   MRSA by PCR Next Gen NOT DETECTED NOT DETECTED Final    Comment: (NOTE) The GeneXpert MRSA Assay (FDA approved for NASAL specimens only), is one component of a comprehensive MRSA colonization surveillance program. It is not intended to diagnose MRSA infection nor to guide or monitor treatment for MRSA infections. Test performance is not FDA approved in patients less than 15 years old. Performed at [facility], [address], [place], [place] [address]          Radiology Studies: ECHOCARDIOGRAM COMPLETE  Result Date: 11/28/2022    ECHOCARDIOGRAM REPORT   Patient Name:   Emma Morris Date of Exam: 11/28/2022 Medical Rec #:  [id number]          Height:       62.0 in Accession #:    [id number]         Weight:       292.1 lb Date of Birth:  15-Apr-1961         BSA:          2.246 m Patient Age:    61 years           BP:           98/71 mmHg Patient Gender: F                  HR:            88 bpm. Exam Location:  Inpatient Procedure: 2D Echo, Cardiac Doppler and Color Doppler Indications:    I26.02 Pulmonary embolus  History:        Patient has prior history of Echocardiogram examinations, most                 recent 03/28/2022. Cardiomegaly, Signs/Symptoms:Dyspnea and                 Shortness of Breath; Risk Factors:Hypertension.  Sonographer:    Sheralyn Boatman RDCS Referring Phys: [phone] JACOB Manus Rudd  Sonographer Comments: Patient is obese. Image acquisition challenging due to patient body habitus. IMPRESSIONS  1. Left ventricular ejection fraction, by estimation, is 65 to 70%. Left ventricular ejection fraction by 2D MOD biplane is 68.8 %. The left ventricle has normal function. The left ventricle has no regional wall motion abnormalities. There is mild left ventricular hypertrophy. Left ventricular diastolic parameters are consistent with Grade I diastolic dysfunction (impaired relaxation).  2. Right ventricular systolic function is normal. The right ventricular size is normal. There is normal pulmonary artery systolic pressure. The estimated right ventricular systolic pressure is 29.9 mmHg.  3. The mitral valve is grossly normal. Trivial mitral valve regurgitation.  4. The aortic valve is tricuspid. Aortic valve regurgitation is not visualized.  5. Aortic dilatation noted. There is borderline dilatation of the ascending aorta, measuring 38 mm.  6. The inferior vena cava is dilated in size with <50% respiratory variability, suggesting right atrial pressure of 15 mmHg. Comparison(s): Changes from prior study are noted. 03/28/2022: LVEF >75%. Conclusion(s)/Recommendation(s): No evidence of right heart strain. FINDINGS  Left Ventricle: Left ventricular ejection fraction, by estimation, is 65 to 70%. Left  ventricular ejection fraction by 2D MOD biplane is 68.8 %. The left ventricle has normal function. The left ventricle has no regional wall motion abnormalities. The left ventricular internal cavity  size was normal in size. There is mild left ventricular hypertrophy. Left ventricular diastolic parameters are consistent with Grade I diastolic dysfunction (impaired relaxation). Indeterminate filling pressures. Right Ventricle: The right ventricular size is normal. No increase in right ventricular wall thickness. Right ventricular systolic function is normal. There is normal pulmonary artery systolic pressure. The tricuspid regurgitant velocity is 1.93 m/s, and  with an assumed right atrial pressure of 15 mmHg, the estimated right ventricular systolic pressure is 29.9 mmHg. Left Atrium: Left atrial size was normal in size. Right Atrium: Right atrial size was normal in size. Pericardium: Trivial pericardial effusion is present. The pericardial effusion is circumferential. Mitral Valve: The mitral valve is grossly normal. Trivial mitral valve regurgitation. Tricuspid Valve: The tricuspid valve is grossly normal. Tricuspid valve regurgitation is trivial. Aortic Valve: The aortic valve is tricuspid. Aortic valve regurgitation is not visualized. Pulmonic Valve: The pulmonic valve was normal in structure. Pulmonic valve regurgitation is not visualized. Aorta: Aortic dilatation noted. There is borderline dilatation of the ascending aorta, measuring 38 mm. Venous: The inferior vena cava is dilated in size with less than 50% respiratory variability, suggesting right atrial pressure of 15 mmHg. IAS/Shunts: No atrial level shunt detected by color flow Doppler.  LEFT VENTRICLE PLAX 2D                        Biplane EF (MOD) LVIDd:         4.50 cm         LV Biplane EF:   Left LVIDs:         2.70 cm                          ventricular LV PW:         1.10 cm                          ejection LV IVS:        1.20 cm                          fraction by LVOT diam:     2.40 cm                          2D MOD LV SV:         95                               biplane is LV SV Index:   42                               68.8 %. LVOT  Area:     4.52 cm                                Diastology                                LV  e' medial:    7.18 cm/s LV Volumes (MOD)               LV E/e' medial:  12.6 LV vol d, MOD    72.9 ml       LV e' lateral:   4.90 cm/s A2C:                           LV E/e' lateral: 18.4 LV vol d, MOD    52.1 ml A4C: LV vol s, MOD    20.2 ml A2C: LV vol s, MOD    19.4 ml A4C: LV SV MOD A2C:   52.7 ml LV SV MOD A4C:   52.1 ml LV SV MOD BP:    43.1 ml RIGHT VENTRICLE             IVC RV S prime:     13.20 cm/s  IVC diam: 2.40 cm TAPSE (M-mode): 2.3 cm LEFT ATRIUM             Index        RIGHT ATRIUM           Index LA diam:        3.10 cm 1.38 cm/m   RA Area:     12.60 cm LA Vol (A2C):   22.1 ml 9.84 ml/m   RA Volume:   30.00 ml  13.36 ml/m LA Vol (A4C):   34.3 ml 15.27 ml/m LA Biplane Vol: 29.1 ml 12.96 ml/m  AORTIC VALVE LVOT Vmax:   127.50 cm/s LVOT Vmean:  84.050 cm/s LVOT VTI:    0.211 m  AORTA Ao Root diam: 3.10 cm Ao Asc diam:  3.80 cm MITRAL VALVE               TRICUSPID VALVE MV Area (PHT): 3.66 cm    TR Peak grad:   14.9 mmHg MV Decel Time: 208 msec    TR Vmax:        193.00 cm/s MV E velocity: 90.35 cm/s MV A velocity: 76.75 cm/s  SHUNTS MV E/A ratio:  1.18        Systemic VTI:  0.21 m                            Systemic Diam: 2.40 cm Zoila Shutter MD Electronically signed by Zoila Shutter MD Signature Date/Time: 11/28/2022/3:33:46 PM    Final    US Venous Img Lower Bilateral (DVT)  Result Date: 11/28/2022 CLINICAL DATA:  Recent diagnosis of pulmonary embolism. Evaluate for DVT. EXAM: BILATERAL LOWER EXTREMITY VENOUS DOPPLER ULTRASOUND TECHNIQUE: Gray-scale sonography with graded compression, as well as color Doppler and duplex ultrasound were performed to evaluate the lower extremity deep venous systems from the level of the common femoral vein and including the common femoral, femoral, profunda femoral, popliteal and calf veins including the posterior tibial, peroneal and gastrocnemius veins when  visible. The superficial great saphenous vein was also interrogated. Spectral Doppler was utilized to evaluate flow at rest and with distal augmentation maneuvers in the common femoral, femoral and popliteal veins. COMPARISON:  None Available. FINDINGS: Examination degraded due to patient body habitus and poor sonographic window. RIGHT LOWER EXTREMITY Common Femoral Vein: No evidence of thrombus. Normal compressibility, respiratory phasicity and response to augmentation. Saphenofemoral Junction: No evidence of thrombus. Normal compressibility and flow on color Doppler imaging. Profunda Femoral Vein: No evidence of thrombus. Normal compressibility  and flow on color Doppler imaging. Femoral Vein: No evidence of thrombus. Normal compressibility, respiratory phasicity and response to augmentation. Popliteal Vein: No evidence of thrombus. Normal compressibility, respiratory phasicity and response to augmentation. Calf Veins: Appear patent where visualized. Superficial Great Saphenous Vein: No evidence of thrombus. Normal compressibility. Other Findings:  None. LEFT LOWER EXTREMITY Common Femoral Vein: No evidence of thrombus. Normal compressibility, respiratory phasicity and response to augmentation. Saphenofemoral Junction: No evidence of thrombus. Normal compressibility and flow on color Doppler imaging. Profunda Femoral Vein: No evidence of thrombus. Normal compressibility and flow on color Doppler imaging. Femoral Vein: No evidence of thrombus. Normal compressibility, respiratory phasicity and response to augmentation. Popliteal Vein: No evidence of thrombus. Normal compressibility, respiratory phasicity and response to augmentation. Calf Veins: Appear patent where visualized. Superficial Great Saphenous Vein: No evidence of thrombus. Normal compressibility. Other Findings:  None. IMPRESSION: No evidence of DVT within either lower extremity. Electronically Signed   By: Simonne Come M.D.   On: 11/28/2022 11:17   CT  Angio Chest PE W and/or Wo Contrast  Result Date: 11/27/2022 CLINICAL DATA:  Chest pain, shortness of breath EXAM: CT ANGIOGRAPHY CHEST WITH CONTRAST TECHNIQUE: Multidetector CT imaging of the chest was performed using the standard protocol during bolus administration of intravenous contrast. Multiplanar CT image reconstructions and MIPs were obtained to evaluate the vascular anatomy. RADIATION DOSE REDUCTION: This exam was performed according to the departmental dose-optimization program which includes automated exposure control, adjustment of the mA and/or kV according to patient size and/or use of iterative reconstruction technique. CONTRAST:  75mL OMNIPAQUE IOHEXOL 350 MG/ML SOLN COMPARISON:  07/26/2022 FINDINGS: Cardiovascular: There is homogeneous enhancement in thoracic aorta. There is ectasia of main pulmonary artery measuring 3.7 cm suggesting pulmonary arterial hypertension. In image 70 of series 5, there is filling defect in a segmental pulmonary artery branch in right upper lobe. Evaluation of peripheral pulmonary artery branches is limited by extensive patchy infiltrates and breathing motion artifacts. Evaluation of RV LV ratio is somewhat limited due to less than optimal contrast density in the right ventricular cavity. As far as seen, RV LV ratio appears to be more than 1 suggesting possible right heart strain. Heart is enlarged in size. Mediastinum/Nodes: There are slightly enlarged lymph nodes in mediastinum and hilar regions. There are enlarged lymph nodes in both axillary regions measuring up to 1.9 x 1.5 cm. Lungs/Pleura: Extensive patchy infiltrates are seen both lungs, more so in the lower lung fields. There is minimal right pleural effusion. There is no pneumothorax. Upper Abdomen: No acute findings are seen. Musculoskeletal: No acute findings are seen. Review of the MIP images confirms the above findings. IMPRESSION: Intraluminal filling defects are seen in segmental branches in right upper  lobe suggesting PE with small thrombus burden. Evaluation of other peripheral pulmonary artery branches is limited by extensive patchy infiltrates and breathing motion. No filling defects are seen in main pulmonary artery branches in the mediastinum. Main pulmonary artery measures 3.7 cm suggesting pulmonary arterial hypertension. There is possible prominence of right ventricle cavity in comparison to the left ventricular cavity suggesting right heart strain. Evaluation of size of right ventricular cavity is technically difficult due to less than optimal contrast density in the lumen. Extensive patchy infiltrates are seen in both lungs, more so in the lower lung fields suggesting multifocal atelectasis/pneumonia. Electronically Signed   By: Ernie Avena M.D.   On: 11/27/2022 15:32   DG Chest Port 1 View  Result Date: 11/27/2022 CLINICAL DATA:  Chest  pain and shortness of breath. EXAM: PORTABLE CHEST 1 VIEW COMPARISON:  CT chest and chest x-ray dated July 26, 2022. FINDINGS: Unchanged cardiomegaly. Significantly decreased lung volumes with bibasilar opacities, likely atelectasis. No pneumothorax or large pleural effusion. No acute osseous abnormality. IMPRESSION: 1. Low lung volumes with bibasilar atelectasis. Electronically Signed   By: Obie Dredge M.D.   On: 11/27/2022 12:18        Scheduled Meds:  amLODipine  10 mg Oral Daily   Chlorhexidine Gluconate Cloth  6 each Topical Q0600   methylPREDNISolone (SOLU-MEDROL) injection  60 mg Intravenous Q12H   Continuous Infusions:  cefTRIAXone (ROCEPHIN)  IV Stopped (11/28/22 1539)   heparin 1,700 Units/hr (11/29/22 0826)     LOS: 2 days    Time spent: 35 minutes    Ousmane Seeman D Sherryll Burger, DO [facility]  If 7PM-7AM, please contact night-coverage [email or web address] 11/29/2022, 10:58 AM

## 2022-11-30 ENCOUNTER — Encounter (HOSPITAL_COMMUNITY): Payer: Self-pay

## 2022-11-30 ENCOUNTER — Other Ambulatory Visit (HOSPITAL_COMMUNITY): Payer: Self-pay

## 2022-11-30 ENCOUNTER — Ambulatory Visit (HOSPITAL_COMMUNITY): Payer: 59

## 2022-11-30 ENCOUNTER — Ambulatory Visit (HOSPITAL_COMMUNITY)
Admission: RE | Admit: 2022-11-30 | Discharge: 2022-11-30 | Disposition: A | Discharge status: Home / Self Care | Payer: 59 | Source: Ambulatory Visit | Attending: Family Medicine | Admitting: Family Medicine

## 2022-11-30 ENCOUNTER — Inpatient Hospital Stay (HOSPITAL_COMMUNITY)
Admission: RE | Admit: 2022-11-30 | Discharge: 2022-11-30 | Disposition: A | Discharge status: Home / Self Care | Payer: 59 | Source: Ambulatory Visit | Attending: Family Medicine | Admitting: Family Medicine

## 2022-11-30 DIAGNOSIS — J9621 Acute and chronic respiratory failure with hypoxia: Secondary | ICD-10-CM | POA: Diagnosis not present

## 2022-11-30 LAB — MAGNESIUM: Magnesium: 2.8 mg/dL — ABNORMAL HIGH (ref 1.7–2.4)

## 2022-11-30 LAB — CBC
HCT: 42.2 % (ref 36.0–46.0)
Hemoglobin: 11.6 g/dL — ABNORMAL LOW (ref 12.0–15.0)
MCH: 24.9 pg — ABNORMAL LOW (ref 26.0–34.0)
MCHC: 27.5 g/dL — ABNORMAL LOW (ref 30.0–36.0)
MCV: 90.8 fL (ref 80.0–100.0)
Platelets: 195 10*3/uL (ref 150–400)
RBC: 4.65 MIL/uL (ref 3.87–5.11)
RDW: 19.3 % — ABNORMAL HIGH (ref 11.5–15.5)
WBC: 12.6 10*3/uL — ABNORMAL HIGH (ref 4.0–10.5)
nRBC: 0.2 % (ref 0.0–0.2)

## 2022-11-30 LAB — BASIC METABOLIC PANEL
Anion gap: 9 (ref 5–15)
BUN: 38 mg/dL — ABNORMAL HIGH (ref 8–23)
CO2: 33 mmol/L — ABNORMAL HIGH (ref 22–32)
Calcium: 8.6 mg/dL — ABNORMAL LOW (ref 8.9–10.3)
Chloride: 93 mmol/L — ABNORMAL LOW (ref 98–111)
Creatinine, Ser: 0.93 mg/dL (ref 0.44–1.00)
GFR, Estimated: >60 mL/min (ref >60)
Glucose, Bld: 108 mg/dL — ABNORMAL HIGH (ref 70–99)
Potassium: 4.6 mmol/L (ref 3.5–5.1)
Sodium: 135 mmol/L (ref 135–145)

## 2022-11-30 LAB — HEPARIN LEVEL (UNFRACTIONATED): Heparin Unfractionated: 0.45 IU/mL (ref 0.30–0.70)

## 2022-11-30 LAB — LEGIONELLA PNEUMOPHILA SEROGP 1 UR AG: L. pneumophila Serogp 1 Ur Ag: NEGATIVE

## 2022-11-30 MED ORDER — APIXABAN 5 MG PO TABS: 5.0000 mg | ORAL_TABLET | Freq: Two times a day (BID) | ORAL | Status: DC

## 2022-11-30 MED ORDER — POLYETHYLENE GLYCOL 3350 17 G PO PACK
17.0000 g | PACK | Freq: Every day | ORAL | Status: DC
  Administered 2022-11-30 – 2022-12-02 (×3): 17 g via ORAL
  Filled 2022-11-30 (×2): qty 1

## 2022-11-30 MED ORDER — METHYLPREDNISOLONE SODIUM SUCC 40 MG IJ SOLR
40.0000 mg | Freq: Two times a day (BID) | INTRAMUSCULAR | Status: DC
  Administered 2022-11-30 – 2022-12-01 (×2): 40 mg via INTRAVENOUS
  Filled 2022-11-30 (×2): qty 1

## 2022-11-30 MED ORDER — APIXABAN 5 MG PO TABS
10.0000 mg | ORAL_TABLET | Freq: Two times a day (BID) | ORAL | Status: DC
  Administered 2022-11-30 – 2022-12-02 (×5): 10 mg via ORAL
  Filled 2022-11-30 (×5): qty 2

## 2022-11-30 NOTE — Progress Notes (Signed)
Patient arrived to unit on 2 liters of oxygen, per order wean patient to room air. Patients oxygen saturation on room air was 83%, placed patient back on 2 liters oxygen saturation 94%. MD Sherryll Burger made aware. No new orders.

## 2022-11-30 NOTE — Progress Notes (Signed)
PROGRESS NOTE    Emma Morris  WUJ:[id number] DOB: [birth year] DOA: 11/27/2022 PCP: Alvina Filbert, MD   Brief Narrative:    Emma Morris is a 62 y.o. female with medical history significant of hypertension, obesity, asthma.  Patient was seen for chest pain or shortness of breath that started this morning.  She was admitted for acute on chronic hypoxemic respiratory failure noted to be multifactorial in the setting of PE as well as community-acquired pneumonia and associated acute COPD exacerbation.  She is overall improving and slowly weaning to her baseline 2 L nasal cannula.  Transition heparin drip to Eliquis today.  Anticipate discharge in the next 24 hours if further improved.  Assessment & Plan:   Principal Problem:   Acute on chronic respiratory failure with hypoxia (HCC) Active Problems:   HTN (hypertension)   Asthma, chronic obstructive, with acute exacerbation (HCC)   CAP (community acquired pneumonia)   PE (pulmonary thromboembolism) (HCC)  Assessment and Plan:   Acute on chronic respiratory failure with hypoxia Multifactorial Patient is very sick with high likelihood of decline. Currently on 4 L nasal cannula, wean to baseline 1-2 L Okay for transfer to telemetry once bed available PE with RV strain Change heparin drip to Eliquis today Some RV strain noted on CT scan, however this was not seen on 2D echocardiogram.  LVEF 65 to 70% with no other acute abnormalities noted. Bilateral DVT studies negative CAP  Antibiotics: Rocephin and azithromycin Robitussin Blood cultures with no growth noted Sputum cultures CBC tomorrow Strep pneumonia negative and Legionella pending Lactic acid 1.1 and procalcitonin less than 0.10 COPD exacerbation Antibiotics: as above DuoNeb's every 6 scheduled with albuterol every 2 when necessary Continue inhaled steroids and LA bronchodilator Solu-Medrol will be weaned to 40 mg IV twice daily today, can transition to  prednisone on discharge Mucinex 5.   Morbid obesity    DVT prophylaxis: Heparin drip to Eliquis today Code Status: Full Family Communication: None at bedside Disposition Plan:  Status is: Inpatient Remains inpatient appropriate because: Need for IV medications  Consultants:  None  Procedures:  None  Antimicrobials:  Anti-infectives (From admission, onward)    Start     Dose/Rate Route Frequency Ordered Stop   11/29/22 1600  azithromycin (ZITHROMAX) 500 mg in sodium chloride 0.9 % 250 mL IVPB        500 mg 250 mL/hr over 60 Minutes Intravenous Every 24 hours 11/29/22 1106 12/03/22 1559   11/28/22 1600  cefTRIAXone (ROCEPHIN) 2 g in sodium chloride 0.9 % 100 mL IVPB        2 g 200 mL/hr over 30 Minutes Intravenous Every 24 hours 11/27/22 2039     11/28/22 1600  azithromycin (ZITHROMAX) 500 mg in sodium chloride 0.9 % 250 mL IVPB        500 mg 250 mL/hr over 60 Minutes Intravenous  Once 11/27/22 2039 11/28/22 1644   11/27/22 1615  cefTRIAXone (ROCEPHIN) 2 g in sodium chloride 0.9 % 100 mL IVPB        2 g 200 mL/hr over 30 Minutes Intravenous  Once 11/27/22 1602 11/27/22 1818   11/27/22 1615  azithromycin (ZITHROMAX) 500 mg in sodium chloride 0.9 % 250 mL IVPB        500 mg 250 mL/hr over 60 Minutes Intravenous  Once 11/27/22 1602 11/27/22 1818      Subjective: Patient seen and evaluated today with no new acute complaints or concerns. No acute concerns or events noted overnight.  She still remains on 4 L nasal cannula oxygen.  Objective: Vitals:   11/30/22 0646 11/30/22 0700 11/30/22 0745 11/30/22 0813  BP:    133/82  Pulse: 94 89 99 90  Resp: 20 17 (!) 26 20  Temp:   98 F (36.7 C)   TempSrc:   Oral   SpO2: (!) 81% 97% 100% 97%  Weight:      Height:        Intake/Output Summary (Last 24 hours) at 11/30/2022 0909 Last data filed at 11/30/2022 0808 Gross per 24 hour  Intake 1527.91 ml  Output 1350 ml  Net 177.91 ml   Filed Weights   11/27/22 1745 11/28/22  0500 11/30/22 0537  Weight: 132.6 kg 132.5 kg 134.7 kg    Examination:  General exam: Appears calm and comfortable, morbidly obese Respiratory system: Clear to auscultation. Respiratory effort normal.  4 L nasal cannula Cardiovascular system: S1 & S2 heard, RRR.  Gastrointestinal system: Abdomen is soft Central nervous system: Somnolent Extremities: No edema Skin: No significant lesions noted Psychiatry: Flat affect.    Data Reviewed: I have personally reviewed following labs and imaging studies  CBC: Recent Labs  Lab 11/27/22 1212 11/28/22 0550 11/29/22 0406 11/30/22 0457  WBC 9.5 14.6* 13.9* 12.6*  NEUTROABS 7.8*  --   --   --   HGB 12.3 11.9* 11.3* 11.6*  HCT 43.2 42.8 39.7 42.2  MCV 87.6 89.5 87.6 90.8  PLT 261 251 235 195   Basic Metabolic Panel: Recent Labs  Lab 11/27/22 1212 11/28/22 0550 11/29/22 0406 11/30/22 0457  NA 136 135 134* 135  K 3.5 4.1 4.5 4.6  CL 95* 93* 92* 93*  CO2 36* 33* 33* 33*  GLUCOSE 122* 131* 117* 108*  BUN 14 20 33* 38*  CREATININE 0.73 0.99 0.97 0.93  CALCIUM 8.7* 8.3* 8.6* 8.6*  MG  --   --  2.5* 2.8*   GFR: Estimated Creatinine Clearance: 84.1 mL/min (by C-G formula based on SCr of 0.93 mg/dL). Liver Function Tests: Recent Labs  Lab 11/27/22 1212  AST 11*  ALT 11  ALKPHOS 47  BILITOT 0.6  PROT 8.7*  ALBUMIN 3.2*   No results for input(s): "LIPASE", "AMYLASE" in the last 168 hours. No results for input(s): "AMMONIA" in the last 168 hours. Coagulation Profile: No results for input(s): "INR", "PROTIME" in the last 168 hours. Cardiac Enzymes: No results for input(s): "CKTOTAL", "CKMB", "CKMBINDEX", "TROPONINI" in the last 168 hours. BNP (last 3 results) No results for input(s): "PROBNP" in the last 8760 hours. HbA1C: No results for input(s): "HGBA1C" in the last 72 hours. CBG: No results for input(s): "GLUCAP" in the last 168 hours. Lipid Profile: No results for input(s): "CHOL", "HDL", "LDLCALC", "TRIG",  "CHOLHDL", "LDLDIRECT" in the last 72 hours. Thyroid Function Tests: No results for input(s): "TSH", "T4TOTAL", "FREET4", "T3FREE", "THYROIDAB" in the last 72 hours. Anemia Panel: No results for input(s): "VITAMINB12", "FOLATE", "FERRITIN", "TIBC", "IRON", "RETICCTPCT" in the last 72 hours. Sepsis Labs: Recent Labs  Lab 11/27/22 1833 11/27/22 2007  PROCALCITON <0.10  --   LATICACIDVEN 1.1 1.4    Recent Results (from the past 240 hour(s))  Culture, blood (Routine X 2) w Reflex to ID Panel     Status: None (Preliminary result)   Collection Time: 11/27/22  6:15 PM   Specimen: BLOOD RIGHT HAND  Result Value Ref Range Status   Specimen Description BLOOD RIGHT HAND  Final   Special Requests   Final    BOTTLES  DRAWN AEROBIC AND ANAEROBIC Blood Culture adequate volume   Culture   Final    NO GROWTH 3 DAYS Performed at [facility], [address], [place], [place] [address]    Report Status PENDING  Incomplete  Culture, blood (Routine X 2) w Reflex to ID Panel     Status: None (Preliminary result)   Collection Time: 11/27/22  6:33 PM   Specimen: BLOOD RIGHT HAND  Result Value Ref Range Status   Specimen Description BLOOD RIGHT HAND  Final   Special Requests   Final    BOTTLES DRAWN AEROBIC AND ANAEROBIC Blood Culture adequate volume   Culture   Final    NO GROWTH 3 DAYS Performed at [facility], [address], [place], [place] [address]    Report Status PENDING  Incomplete  MRSA Next Gen by PCR, Nasal     Status: None   Collection Time: 11/27/22  6:50 PM   Specimen: Nasal Mucosa; Nasal Swab  Result Value Ref Range Status   MRSA by PCR Next Gen NOT DETECTED NOT DETECTED Final    Comment: (NOTE) The GeneXpert MRSA Assay (FDA approved for NASAL specimens only), is one component of a comprehensive MRSA colonization surveillance program. It is not intended to diagnose MRSA infection nor to guide or monitor treatment for MRSA infections. Test performance is not FDA approved in  patients less than 11 years old. Performed at [facility], [address] [place], [place] [address]          Radiology Studies: ECHOCARDIOGRAM COMPLETE  Result Date: 11/28/2022    ECHOCARDIOGRAM REPORT   Patient Name:   Adriana Reams Date of Exam: 11/28/2022 Medical Rec #:  [id number]          Height:       62.0 in Accession #:    [id number]         Weight:       292.1 lb Date of Birth:  1961/03/13         BSA:          2.246 m Patient Age:    61 years           BP:           98/71 mmHg Patient Gender: F                  HR:           88 bpm. Exam Location:  Inpatient Procedure: 2D Echo, Cardiac Doppler and Color Doppler Indications:    I26.02 Pulmonary embolus  History:        Patient has prior history of Echocardiogram examinations, most                 recent 03/28/2022. Cardiomegaly, Signs/Symptoms:Dyspnea and                 Shortness of Breath; Risk Factors:Hypertension.  Sonographer:    Sheralyn Boatman RDCS Referring Phys: [phone] JACOB Manus Rudd  Sonographer Comments: Patient is obese. Image acquisition challenging due to patient body habitus. IMPRESSIONS  1. Left ventricular ejection fraction, by estimation, is 65 to 70%. Left ventricular ejection fraction by 2D MOD biplane is 68.8 %. The left ventricle has normal function. The left ventricle has no regional wall motion abnormalities. There is mild left ventricular hypertrophy. Left ventricular diastolic parameters are consistent with Grade I diastolic dysfunction (impaired relaxation).  2. Right ventricular systolic function is normal. The right ventricular size is normal. There  is normal pulmonary artery systolic pressure. The estimated right ventricular systolic pressure is 29.9 mmHg.  3. The mitral valve is grossly normal. Trivial mitral valve regurgitation.  4. The aortic valve is tricuspid. Aortic valve regurgitation is not visualized.  5. Aortic dilatation noted. There is borderline dilatation of the ascending aorta, measuring 38 mm.  6. The  inferior vena cava is dilated in size with <50% respiratory variability, suggesting right atrial pressure of 15 mmHg. Comparison(s): Changes from prior study are noted. 03/28/2022: LVEF >75%. Conclusion(s)/Recommendation(s): No evidence of right heart strain. FINDINGS  Left Ventricle: Left ventricular ejection fraction, by estimation, is 65 to 70%. Left ventricular ejection fraction by 2D MOD biplane is 68.8 %. The left ventricle has normal function. The left ventricle has no regional wall motion abnormalities. The left ventricular internal cavity size was normal in size. There is mild left ventricular hypertrophy. Left ventricular diastolic parameters are consistent with Grade I diastolic dysfunction (impaired relaxation). Indeterminate filling pressures. Right Ventricle: The right ventricular size is normal. No increase in right ventricular wall thickness. Right ventricular systolic function is normal. There is normal pulmonary artery systolic pressure. The tricuspid regurgitant velocity is 1.93 m/s, and  with an assumed right atrial pressure of 15 mmHg, the estimated right ventricular systolic pressure is 29.9 mmHg. Left Atrium: Left atrial size was normal in size. Right Atrium: Right atrial size was normal in size. Pericardium: Trivial pericardial effusion is present. The pericardial effusion is circumferential. Mitral Valve: The mitral valve is grossly normal. Trivial mitral valve regurgitation. Tricuspid Valve: The tricuspid valve is grossly normal. Tricuspid valve regurgitation is trivial. Aortic Valve: The aortic valve is tricuspid. Aortic valve regurgitation is not visualized. Pulmonic Valve: The pulmonic valve was normal in structure. Pulmonic valve regurgitation is not visualized. Aorta: Aortic dilatation noted. There is borderline dilatation of the ascending aorta, measuring 38 mm. Venous: The inferior vena cava is dilated in size with less than 50% respiratory variability, suggesting right atrial  pressure of 15 mmHg. IAS/Shunts: No atrial level shunt detected by color flow Doppler.  LEFT VENTRICLE PLAX 2D                        Biplane EF (MOD) LVIDd:         4.50 cm         LV Biplane EF:   Left LVIDs:         2.70 cm                          ventricular LV PW:         1.10 cm                          ejection LV IVS:        1.20 cm                          fraction by LVOT diam:     2.40 cm                          2D MOD LV SV:         95                               biplane is LV  SV Index:   42                               68.8 %. LVOT Area:     4.52 cm                                Diastology                                LV e' medial:    7.18 cm/s LV Volumes (MOD)               LV E/e' medial:  12.6 LV vol d, MOD    72.9 ml       LV e' lateral:   4.90 cm/s A2C:                           LV E/e' lateral: 18.4 LV vol d, MOD    52.1 ml A4C: LV vol s, MOD    20.2 ml A2C: LV vol s, MOD    19.4 ml A4C: LV SV MOD A2C:   52.7 ml LV SV MOD A4C:   52.1 ml LV SV MOD BP:    43.1 ml RIGHT VENTRICLE             IVC RV S prime:     13.20 cm/s  IVC diam: 2.40 cm TAPSE (M-mode): 2.3 cm LEFT ATRIUM             Index        RIGHT ATRIUM           Index LA diam:        3.10 cm 1.38 cm/m   RA Area:     12.60 cm LA Vol (A2C):   22.1 ml 9.84 ml/m   RA Volume:   30.00 ml  13.36 ml/m LA Vol (A4C):   34.3 ml 15.27 ml/m LA Biplane Vol: 29.1 ml 12.96 ml/m  AORTIC VALVE LVOT Vmax:   127.50 cm/s LVOT Vmean:  84.050 cm/s LVOT VTI:    0.211 m  AORTA Ao Root diam: 3.10 cm Ao Asc diam:  3.80 cm MITRAL VALVE               TRICUSPID VALVE MV Area (PHT): 3.66 cm    TR Peak grad:   14.9 mmHg MV Decel Time: 208 msec    TR Vmax:        193.00 cm/s MV E velocity: 90.35 cm/s MV A velocity: 76.75 cm/s  SHUNTS MV E/A ratio:  1.18        Systemic VTI:  0.21 m                            Systemic Diam: 2.40 cm Zoila Shutter MD Electronically signed by Zoila Shutter MD Signature Date/Time: 11/28/2022/3:33:46 PM    Final    US Venous Img  Lower Bilateral (DVT)  Result Date: 11/28/2022 CLINICAL DATA:  Recent diagnosis of pulmonary embolism. Evaluate for DVT. EXAM: BILATERAL LOWER EXTREMITY VENOUS DOPPLER ULTRASOUND TECHNIQUE: Gray-scale sonography with graded compression, as well as color Doppler and duplex ultrasound were performed to evaluate the lower extremity deep venous systems from the level of the common femoral vein  and including the common femoral, femoral, profunda femoral, popliteal and calf veins including the posterior tibial, peroneal and gastrocnemius veins when visible. The superficial great saphenous vein was also interrogated. Spectral Doppler was utilized to evaluate flow at rest and with distal augmentation maneuvers in the common femoral, femoral and popliteal veins. COMPARISON:  None Available. FINDINGS: Examination degraded due to patient body habitus and poor sonographic window. RIGHT LOWER EXTREMITY Common Femoral Vein: No evidence of thrombus. Normal compressibility, respiratory phasicity and response to augmentation. Saphenofemoral Junction: No evidence of thrombus. Normal compressibility and flow on color Doppler imaging. Profunda Femoral Vein: No evidence of thrombus. Normal compressibility and flow on color Doppler imaging. Femoral Vein: No evidence of thrombus. Normal compressibility, respiratory phasicity and response to augmentation. Popliteal Vein: No evidence of thrombus. Normal compressibility, respiratory phasicity and response to augmentation. Calf Veins: Appear patent where visualized. Superficial Great Saphenous Vein: No evidence of thrombus. Normal compressibility. Other Findings:  None. LEFT LOWER EXTREMITY Common Femoral Vein: No evidence of thrombus. Normal compressibility, respiratory phasicity and response to augmentation. Saphenofemoral Junction: No evidence of thrombus. Normal compressibility and flow on color Doppler imaging. Profunda Femoral Vein: No evidence of thrombus. Normal compressibility and  flow on color Doppler imaging. Femoral Vein: No evidence of thrombus. Normal compressibility, respiratory phasicity and response to augmentation. Popliteal Vein: No evidence of thrombus. Normal compressibility, respiratory phasicity and response to augmentation. Calf Veins: Appear patent where visualized. Superficial Great Saphenous Vein: No evidence of thrombus. Normal compressibility. Other Findings:  None. IMPRESSION: No evidence of DVT within either lower extremity. Electronically Signed   By: Simonne Come M.D.   On: 11/28/2022 11:17        Scheduled Meds:  amLODipine  10 mg Oral Daily   apixaban  10 mg Oral BID   Followed by   Melene Muller ON 12/07/2022] apixaban  5 mg Oral BID   Chlorhexidine Gluconate Cloth  6 each Topical Q0600   methylPREDNISolone (SOLU-MEDROL) injection  40 mg Intravenous Q12H   Continuous Infusions:  azithromycin Stopped (11/29/22 1601)   cefTRIAXone (ROCEPHIN)  IV Stopped (11/29/22 1718)     LOS: 3 days    Time spent: 35 minutes    Chanler Schreiter Hoover Brunette, DO [facility]  If 7PM-7AM, please contact night-coverage [email or web address] 11/30/2022, 9:09 AM

## 2022-11-30 NOTE — Discharge Instructions (Signed)
Information on my medicine - ELIQUIS (apixaban)  This medication education was reviewed with me or my healthcare representative as part of my discharge preparation.  The pharmacist that spoke with me during my hospital stay was:  Tad Moore, [facility]  Why was Eliquis prescribed for you? Eliquis was prescribed to treat blood clots that may have been found in the veins of your legs (deep vein thrombosis) or in your lungs (pulmonary embolism) and to reduce the risk of them occurring again.  What do You need to know about Eliquis ? The starting dose is 10 mg (two 5 mg tablets) taken TWICE daily for the FIRST SEVEN (7) DAYS, then on 12/07/22 the dose is reduced to ONE 5 mg tablet taken TWICE daily.  Eliquis may be taken with or without food.   Try to take the dose about the same time in the morning and in the evening. If you have difficulty swallowing the tablet whole please discuss with your pharmacist how to take the medication safely.  Take Eliquis exactly as prescribed and DO NOT stop taking Eliquis without talking to the doctor who prescribed the medication.  Stopping may increase your risk of developing a new blood clot.  Refill your prescription before you run out.  After discharge, you should have regular check-up appointments with your healthcare provider that is prescribing your Eliquis.    What do you do if you miss a dose? If a dose of ELIQUIS is not taken at the scheduled time, take it as soon as possible on the same day and twice-daily administration should be resumed. The dose should not be doubled to make up for a missed dose.  Important Safety Information A possible side effect of Eliquis is bleeding. You should call your healthcare provider right away if you experience any of the following: Bleeding from an injury or your nose that does not stop. Unusual colored urine (red or dark brown) or unusual colored stools (red or black). Unusual bruising for unknown reasons. A  serious fall or if you hit your head (even if there is no bleeding).  Some medicines may interact with Eliquis and might increase your risk of bleeding or clotting while on Eliquis. To help avoid this, consult your healthcare provider or pharmacist prior to using any new prescription or non-prescription medications, including herbals, vitamins, non-steroidal anti-inflammatory drugs (NSAIDs) and supplements.  This website has more information on Eliquis (apixaban): http://www.eliquis.com/eliquis/home

## 2022-11-30 NOTE — TOC Benefit Eligibility Note (Signed)
Pharmacy Patient Advocate Encounter  Insurance verification completed.    The patient is insured through [employer] Medicare Part D  Ran test claim for Eliquis 5 mg and the current 30 day co-pay is $0.00.   This test claim was processed through [facility]- copay amounts may vary at other pharmacies due to pharmacy/plan contracts, or as the patient moves through the different stages of their insurance plan.    Jeffrey Smith, CPHT Pharmacy Patient Advocate Specialist [facility] Patient Advocate Team Direct Number: [phone]  Fax: [phone] 

## 2022-11-30 NOTE — Progress Notes (Addendum)
ANTICOAGULATION CONSULT NOTE   Pharmacy Consult for heparin >> apixaban Indication: pulmonary embolus  No Known Allergies  Patient Measurements: Height: 5\' 2"  (157.5 cm) Weight: 134.7 kg (296 lb 15.4 oz) IBW/kg (Calculated) : 50.1 Heparin Dosing Weight: 83kg  Labs: Recent Labs    11/28/22 0550 11/29/22 0406 11/29/22 1635 11/30/22 0457  HGB 11.9* 11.3*  --  11.6*  HCT 42.8 39.7  --  42.2  PLT 251 235  --  195  HEPARINUNFRC 0.43 0.25* 0.58 0.45  CREATININE 0.99 0.97  --  0.93    Estimated Creatinine Clearance: 84.1 mL/min (by C-G formula based on SCr of 0.93 mg/dL).  Assessment: 40 YOF presenting with CP and SOB, CT angio chest shows PE, suggestive RHS, she is not on anticoagulation PTA, CBC wnl. Now she is transitioning to apixaban  HL 0.45- therapeutic  CBC WNL  Goal of Therapy:  Heparin level 0.3-0.7 units/ml Monitor platelets by anticoagulation protocol: Yes   Plan:  Stop heparin infusion Start apixaban 10 mg twice daily x 7 days followed by apixaban 5 mg twice daily. Continue to monitor H&H and platelets.  Judeth Cornfield, PharmD Clinical Pharmacist 11/30/2022 7:49 AM

## 2022-12-01 ENCOUNTER — Inpatient Hospital Stay (HOSPITAL_COMMUNITY): Payer: 59

## 2022-12-01 DIAGNOSIS — J9621 Acute and chronic respiratory failure with hypoxia: Secondary | ICD-10-CM | POA: Diagnosis not present

## 2022-12-01 LAB — MAGNESIUM: Magnesium: 2.7 mg/dL — ABNORMAL HIGH (ref 1.7–2.4)

## 2022-12-01 LAB — BASIC METABOLIC PANEL
Anion gap: 6 (ref 5–15)
BUN: 30 mg/dL — ABNORMAL HIGH (ref 8–23)
CO2: 35 mmol/L — ABNORMAL HIGH (ref 22–32)
Calcium: 8.7 mg/dL — ABNORMAL LOW (ref 8.9–10.3)
Chloride: 98 mmol/L (ref 98–111)
Creatinine, Ser: 0.81 mg/dL (ref 0.44–1.00)
GFR, Estimated: >60 mL/min (ref >60)
Glucose, Bld: 89 mg/dL (ref 70–99)
Potassium: 4.6 mmol/L (ref 3.5–5.1)
Sodium: 139 mmol/L (ref 135–145)

## 2022-12-01 LAB — BLOOD GAS, ARTERIAL
Acid-Base Excess: 15.3 mmol/L — ABNORMAL HIGH (ref 0.0–2.0)
Bicarbonate: 42.4 mmol/L — ABNORMAL HIGH (ref 20.0–28.0)
Drawn by: 27016
O2 Saturation: 81.2 %
Patient temperature: 37.1
pCO2 arterial: 61 mmHg — ABNORMAL HIGH (ref 32–48)
pH, Arterial: 7.45 (ref 7.35–7.45)
pO2, Arterial: 48 mmHg — ABNORMAL LOW (ref 83–108)

## 2022-12-01 LAB — CBC
HCT: 43.5 % (ref 36.0–46.0)
Hemoglobin: 12.5 g/dL (ref 12.0–15.0)
MCH: 25.2 pg — ABNORMAL LOW (ref 26.0–34.0)
MCHC: 28.7 g/dL — ABNORMAL LOW (ref 30.0–36.0)
MCV: 87.7 fL (ref 80.0–100.0)
Platelets: 229 10*3/uL (ref 150–400)
RBC: 4.96 MIL/uL (ref 3.87–5.11)
RDW: 19.3 % — ABNORMAL HIGH (ref 11.5–15.5)
WBC: 8.9 10*3/uL (ref 4.0–10.5)
nRBC: 0.3 % — ABNORMAL HIGH (ref 0.0–0.2)

## 2022-12-01 LAB — BRAIN NATRIURETIC PEPTIDE: B Natriuretic Peptide: 233 pg/mL — ABNORMAL HIGH (ref 0.0–100.0)

## 2022-12-01 MED ORDER — AZITHROMYCIN 500 MG PO TABS: 500.0000 mg | ORAL_TABLET | Freq: Every day | ORAL | 0 refills | Status: DC

## 2022-12-01 MED ORDER — FUROSEMIDE 10 MG/ML IJ SOLN
40.0000 mg | Freq: Once | INTRAMUSCULAR | Status: DC
  Filled 2022-12-01: qty 4

## 2022-12-01 MED ORDER — FUROSEMIDE 10 MG/ML IJ SOLN
40.0000 mg | Freq: Once | INTRAMUSCULAR | Status: AC
  Administered 2022-12-01: 40 mg via INTRAVENOUS
  Filled 2022-12-01: qty 4

## 2022-12-01 MED ORDER — METHYLPREDNISOLONE SODIUM SUCC 40 MG IJ SOLR
40.0000 mg | INTRAMUSCULAR | Status: DC
  Administered 2022-12-02: 40 mg via INTRAVENOUS
  Filled 2022-12-01: qty 1

## 2022-12-01 MED ORDER — FUROSEMIDE 40 MG PO TABS: 40.0000 mg | ORAL_TABLET | Freq: Every day | ORAL | 11 refills | Status: DC

## 2022-12-01 MED ORDER — METHYLPREDNISOLONE 4 MG PO TBPK: ORAL_TABLET | ORAL | 0 refills | Status: DC

## 2022-12-01 MED ORDER — APIXABAN 5 MG PO TABS: ORAL_TABLET | ORAL | 3 refills | Status: DC

## 2022-12-01 NOTE — TOC Progression Note (Signed)
Transition of Care [facility]) - Progression Note    Patient Details  Name: Emma Morris MRN: [id number] Date of Birth: [birth year]  [facility]) CM/SW Contact  Leitha Bleak, RN Phone Number: 12/01/2022, 4:15 PM  Clinical Narrative:    Daughter came to assess patient. She states she is at baseline. She needs assistance at home and has an aide. She stated she will go home when she is ready for discharge.   Expected Discharge Plan: Home w Home Health Services Barriers to Discharge: Barriers Resolved  Expected Discharge Plan and Services     Post Acute Care Choice: Durable Medical Equipment, Home Health Living arrangements for the past 2 months: Single Family Home Expected Discharge Date: 12/01/22                     [facility] Agency: [facility] Date HH Agency Contacted: 12/01/22 Time HH Agency Contacted: 1049 Representative spoke with at [facility] Agency: Clifton Custard   Social Determinants of Health (SDOH) Interventions SDOH Screenings   Food Insecurity: No Food Insecurity (11/27/2022)  Housing: Low Risk  (11/27/2022)  Transportation Needs: No Transportation Needs (11/27/2022)  Utilities: Not At Risk (11/27/2022)  Financial Resource Strain: Medium Risk (07/26/2022)   Received from [facility]  Social Connections: Unknown (04/06/2022)   Received from [facility]  Tobacco Use: Low Risk  (11/27/2022)    Readmission Risk Interventions    12/01/2022   10:47 AM 03/29/2022    2:27 PM  Readmission Risk Prevention Plan  Post Dischage Appt Complete Complete  Medication Screening Complete Complete  Transportation Screening Complete Complete

## 2022-12-01 NOTE — Evaluation (Addendum)
Physical Therapy Evaluation Patient Details Name: Emma Morris MRN: [id number] DOB: 1960-11-27 Today's Date: 12/01/2022  History of Present Illness  Emma Morris is a 62 y.o. female with medical history significant of hypertension, obesity, asthma.  Patient was seen for chest pain or shortness of breath that started this morning.  She was admitted for acute on chronic hypoxemic respiratory failure noted to be multifactorial in the setting of PE as well as community-acquired pneumonia and associated acute COPD exacerbation.  She is overall improving and slowly weaning to her baseline 2 L nasal cannula.  Transition heparin drip to Eliquis today.  Anticipate discharge in the next 24 hours if further improved.   Clinical Impression  Pt tolerated treatment well and was able to perform transfers and ambulation using a RW and min assist from therapist. O2 level monitored throughout, noting a slight drop to 87% on room air during ambulation. O2 levels increased with rest. Pt tolerated sitting up in chair after therapy. Patient will benefit from continued skilled physical therapy in hospital and recommended venue below to increase strength, balance, endurance for safe ADLs and gait.        Assistance Recommended at Discharge Set up Supervision/Assistance  If plan is discharge home, recommend the following:  Can travel by private vehicle  A little help with walking and/or transfers;A little help with bathing/dressing/bathroom;Assistance with cooking/housework;Assist for transportation        Equipment Recommendations Rolling walker (2 wheels)  Recommendations for Other Services       Functional Status Assessment Patient has had a recent decline in their functional status and demonstrates the ability to make significant improvements in function in a reasonable and predictable amount of time.     Precautions / Restrictions Precautions Precautions: Fall Restrictions Weight Bearing  Restrictions: No      Mobility  Bed Mobility Overal bed mobility: Modified Independent             General bed mobility comments: slow    Transfers Overall transfer level: Needs assistance Equipment used: Rolling walker (2 wheels) Transfers: Sit to/from Stand, Bed to chair/wheelchair/BSC Sit to Stand: Modified independent (Device/Increase time), Min guard Stand pivot transfers: Modified independent (Device/Increase time), Min guard         General transfer comment: RW    Ambulation/Gait Ambulation/Gait assistance: Modified independent (Device/Increase time), Min guard Gait Distance (Feet): 50 Feet Assistive device: Rolling walker (2 wheels) Gait Pattern/deviations: Decreased step length - right, Decreased step length - left, Decreased stride length Gait velocity: decreased     General Gait Details: RW  Stairs            Wheelchair Mobility     Tilt Bed    Modified Rankin (Stroke Patients Only)       Balance Overall balance assessment: Modified Independent                                           Pertinent Vitals/Pain Pain Assessment Pain Assessment: No/denies pain Pain Score: 0-No pain    Home Living Family/patient expects to be discharged to:: Private residence Living Arrangements: Children Available Help at Discharge: Available PRN/intermittently;Family Type of Home: Apartment Home Access: Level entry       Home Layout: One level Home Equipment: Grab bars - tub/shower;Rolling Walker (2 wheels)      Prior Function Prior Level of Function : Independent/Modified Independent  Mobility Comments: Engineer, 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 ADLs Comments: Independent     Hand Dominance        Extremity/Trunk Assessment                Communication   Communication: No difficulties  Cognition Arousal/Alertness: Awake/alert Behavior During Therapy: WFL for tasks assessed/performed Overall Cognitive Status:  Within Functional Limits for tasks assessed                                          General Comments      Exercises     Assessment/Plan    PT Assessment Patient needs continued PT services  PT Problem List Decreased strength;Decreased activity tolerance;Decreased balance       PT Treatment Interventions DME instruction;Therapeutic exercise;Therapeutic activities;Gait training;Balance training    PT Goals (Current goals can be found in the Care Plan section)  Acute Rehab PT Goals Patient Stated Goal: return home with assist of family PT Goal Formulation: With patient Time For Goal Achievement: 12/15/22 Potential to Achieve Goals: Good    Frequency Min 3X/week     Co-evaluation               AM-PAC PT "6 Clicks" Mobility  Outcome Measure Help needed turning from your back to your side while in a flat bed without using bedrails?: A Little Help needed moving from lying on your back to sitting on the side of a flat bed without using bedrails?: A Little Help needed moving to and from a bed to a chair (including a wheelchair)?: A Little Help needed standing up from a chair using your arms (e.g., wheelchair or bedside chair)?: A Little Help needed to walk in hospital room?: A Little Help needed climbing 3-5 steps with a railing? : A Little 6 Click Score: 18    End of Session Equipment Utilized During Treatment: Gait belt Activity Tolerance: Patient limited by fatigue;Patient tolerated treatment well Patient left: in chair;with call bell/phone within reach Nurse Communication: Mobility status PT Visit Diagnosis: Unsteadiness on feet (R26.81);Other abnormalities of gait and mobility (R26.89);Muscle weakness (generalized) (M62.81)    Time: 1410-1440 PT Time Calculation (min) (ACUTE ONLY): 30 min   Charges:   PT Evaluation $PT Eval Moderate Complexity: 1 Mod PT Treatments $Therapeutic Activity: 23-37 mins PT General Charges $$ ACUTE PT VISIT: 1  Visit         Shaolin Armas SPT [place], DPT Program

## 2022-12-01 NOTE — TOC Transition Note (Addendum)
Transition of Care [facility]) - CM/SW Discharge Note   Patient Details  Name: Emma Morris MRN: [id number] Date of Birth: 1960-06-17  [facility]) CM/SW Contact:  Leitha Bleak, RN Phone Number: 12/01/2022, 10:49 AM  Addendum :   Dishcharge held, patient is sleepy and abnormal ABG.  Patient lives at [address] [place], [place]   [facility].   TOC following for HHPT agency to accept.   Clinical Narrative:   Patient discharging home. PT is recommending HHPT. Patient is agreeable. Clifton Custard with [facility] accepted the referral. Patient is active with [facility] for home oxygen. She will have her daughter come transport her home. CM reminded her to tell her to bring home tank for transport.     Final next level of care: Home w Home Health Services Barriers to Discharge: Barriers Resolved   Patient Goals and CMS Choice CMS Medicare.gov Compare Post Acute Care list provided to:: Patient Choice offered to / list presented to : Patient  Discharge Placement                 Patient and family notified of of transfer: 12/01/22  Discharge Plan and Services Additional resources added to the After Visit Summary for       Post Acute Care Choice: Durable Medical Equipment, Home Health                 [facility] Agency: [facility] Date [facility] Agency Contacted: 12/01/22 Time HH Agency Contacted: 1049 Representative spoke with at [facility] Agency: Clifton Custard  Social Determinants of Health (SDOH) Interventions SDOH Screenings   Food Insecurity: No Food Insecurity (11/27/2022)  Housing: Low Risk  (11/27/2022)  Transportation Needs: No Transportation Needs (11/27/2022)  Utilities: Not At Risk (11/27/2022)  Financial Resource Strain: Medium Risk (07/26/2022)   Received from [facility]  Social Connections: Unknown (04/06/2022)   Received from [facility]  Tobacco Use: Low Risk  (11/27/2022)     Readmission Risk Interventions    12/01/2022   10:47 AM  03/29/2022    2:27 PM  Readmission Risk Prevention Plan  Post Dischage Appt Complete Complete  Medication Screening Complete Complete  Transportation Screening Complete Complete

## 2022-12-01 NOTE — Progress Notes (Signed)
Called by RN to come assess patient. When I arrived in room the patient was very low in the bed scrunched over because of her position. Her SPO2 on RA was 95% (probe on ear). She seems very sleepy. Answered 3 questions appropriately and then fell asleep. Patient is not in distress at this time.

## 2022-12-01 NOTE — Progress Notes (Signed)
Dr.Shahmehdi notified of following concern with pt:  Pt.has had a neuro change,she is lethargic,confused on some issues, unable to follow some directions.  Pt.has had a dramatic decline in mobility. Pt.was previously able to get up with walker and very little assist to bathroom and now has been observed shaking, very weak, gasping to breathe (oxygen was on)  Per CNA pt. Almost fell in bathroom.   Pt.continues to take oxygen off at times and found to have a oxygen level of 84% however it will come up when pt.is more awake. Pt's nose is bleeding, and she is having sudden pain in LLE.  RN asking for ABG and PT eval and for MD to come an see pt.

## 2022-12-01 NOTE — Plan of Care (Signed)

## 2022-12-01 NOTE — Plan of Care (Signed)
  Problem: Acute Rehab PT Goals(only PT should resolve) Goal: Pt will Roll Supine to Side Outcome: Progressing Flowsheets (Taken 12/01/2022 1545) Pt will Roll Supine to Side:  with supervision  with modified independence Goal: Patient Will Transfer Sit To/From Stand Outcome: Progressing Flowsheets (Taken 12/01/2022 1545) Patient will transfer sit to/from stand:  with supervision  with modified independence Goal: Pt Will Transfer Bed To Chair/Chair To Bed Outcome: Progressing Flowsheets (Taken 12/01/2022 1545) Pt will Transfer Bed to Chair/Chair to Bed:  with supervision  with modified independence Goal: Pt Will Ambulate Outcome: Progressing Flowsheets (Taken 12/01/2022 1545) Pt will Ambulate: 75 feet   Emma Morris SPT [place], DPT Program

## 2022-12-01 NOTE — Discharge Summary (Signed)
Physician Discharge Summary   Patient: Emma Morris MRN: [id number] DOB: [birth year]  Admit date:     11/27/2022  Discharge date: 12/01/22  Discharge Physician: Kendell Bane   PCP: Alvina Filbert, MD   Recommendations at discharge:   With PCP in 2 weeks 2. Follow with a pulmonologist in 2-4 weeks 3. continue current medication including Eliquis, inhalers,  Discharge Diagnoses: Principal Problem:   Acute on chronic respiratory failure with hypoxia (HCC) Active Problems:   HTN (hypertension)   Asthma, chronic obstructive, with acute exacerbation (HCC)   CAP (community acquired pneumonia)   PE (pulmonary thromboembolism) (HCC)   Emma Morris is a 62 y.o. female with medical history significant of hypertension, obesity, asthma.  Patient was seen for chest pain or shortness of breath that started this morning.  She was admitted for acute on chronic hypoxemic respiratory failure noted to be multifactorial in the setting of PE as well as community-acquired pneumonia and associated acute COPD exacerbation.  She is overall improving and slowly weaning to her baseline 2 L nasal cannula.  Transition heparin drip to Eliquis today.  Anticipate discharge in the next 24 hours if further improved.   Assessment & Plan:   Principal Problem:   Acute on chronic respiratory failure with hypoxia (HCC) Active Problems:   HTN (hypertension)   Asthma, chronic obstructive, with acute exacerbation (HCC)   CAP (community acquired pneumonia)   PE (pulmonary thromboembolism) (HCC)        Acute on chronic respiratory failure with hypoxia Multifactorial Patient is very sick with high likelihood of decline. Currently on 4 L nasal cannula, wean to baseline 1-2 L Okay for transfer to telemetry once bed available PE with RV strain Change heparin drip to Eliquis  Some RV strain noted on CT scan, however this was not seen on 2D echocardiogram.  LVEF 65 to 70% with no other acute  abnormalities noted. Bilateral DVT studies negative CAP  Antibiotics: IV Rocephin and azithromycin>> switch to p.o. azithromycin Robitussin as needed  Blood cultures with no growth noted Sputum cultures no growth to date Improved leukocytosis Strep pneumonia negative and Legionella pending -no results yet Lactic acid 1.1 and procalcitonin less than 0.10 COPD exacerbation Antibiotics: as above DuoNeb's every 6 scheduled with albuterol every 2 when necessary Continue inhaled steroids and LA bronchodilator Solu-Medrol will be weaned to 40 mg IV twice daily today, can transition to prednisone  Mucinex 5.   Morbid obesity Body mass index is 54.31 kg/m. Referral to outpatient-PCP for weight loss management     Disposition: Home health Diet recommendation:  Discharge Diet Orders (From admission, onward)     Start     Ordered   12/01/22 0000  Diet - low sodium heart healthy        12/01/22 1035           Cardiac diet DISCHARGE MEDICATION: Allergies as of 12/01/2022   No Known Allergies      Medication List     STOP taking these medications    meloxicam 7.5 MG tablet Commonly known as: MOBIC       TAKE these medications    acetaminophen 500 MG tablet Commonly known as: TYLENOL Take 1,000 mg by mouth every 8 (eight) hours as needed for moderate pain.   albuterol 108 (90 Base) MCG/ACT inhaler Commonly known as: VENTOLIN HFA Inhale 2 puffs into the lungs every 6 (six) hours as needed for wheezing or shortness of breath.   amLODipine 10 MG tablet  Commonly known as: NORVASC Take 10 mg by mouth daily.   apixaban 5 MG Tabs tablet Commonly known as: ELIQUIS Take 2 tablets (10 mg total) by mouth 2 (two) times daily for 7 days, THEN 1 tablet (5 mg total) 2 (two) times daily. Start taking on: December 01, 2022   azithromycin 500 MG tablet Commonly known as: Zithromax Take 1 tablet (500 mg total) by mouth daily for 5 days.   cholecalciferol 25 MCG (1000 UNIT)  tablet Commonly known as: VITAMIN D3 Take 1,000 Units by mouth daily.   dextromethorphan 30 MG/5ML liquid Commonly known as: Delsym Take 5 mLs (30 mg total) by mouth 2 (two) times daily as needed for cough.   fluticasone 50 MCG/ACT nasal spray Commonly known as: FLONASE Place into both nostrils.   furosemide 40 MG tablet Commonly known as: Lasix Take 1 tablet (40 mg total) by mouth daily.   methylPREDNISolone 4 MG Tbpk tablet Commonly known as: MEDROL DOSEPAK Medrol Dosepak take as instructed               Durable Medical Equipment  (From admission, onward)           Start     Ordered   12/01/22 1032  DME Oxygen  Once       Question Answer Comment  Length of Need 12 Months   Liters per Minute 2   Oxygen delivery system Gas      12/01/22 1035            Discharge Exam: Filed Weights   11/27/22 1745 11/28/22 0500 11/30/22 0537  Weight: 132.6 kg 132.5 kg 134.7 kg        General:  AAO x 3,  cooperative, no distress;   HEENT:  Normocephalic, PERRL, otherwise with in Normal limits   Neuro:  CNII-XII intact. , normal motor and sensation, reflexes intact   Lungs:   Clear to auscultation BL, Respirations unlabored,  No wheezes / crackles  Cardio:    S1/S2, RRR, No murmure, No Rubs or Gallops   Abdomen:  Soft, non-tender, bowel sounds active all four quadrants, no guarding or peritoneal signs.  Muscular  skeletal:  Limited exam -global generalized weaknesses - in bed, able to move all 4 extremities,   2+ pulses,  symmetric, No pitting edema  Skin:  Dry, warm to touch, negative for any Rashes,  Wounds: Please see nursing documentation          Condition at discharge: fair  The results of significant diagnostics from this hospitalization (including imaging, microbiology, ancillary and laboratory) are listed below for reference.   Imaging Studies: ECHOCARDIOGRAM COMPLETE  Result Date: 11/28/2022    ECHOCARDIOGRAM REPORT   Patient Name:   Emma Morris Date of Exam: 11/28/2022 Medical Rec #:  [id number]          Height:       62.0 in Accession #:    [id number]         Weight:       292.1 lb Date of Birth:  28-Aug-1960         BSA:          2.246 m Patient Age:    61 years           BP:           98/71 mmHg Patient Gender: F                  HR:  88 bpm. Exam Location:  Inpatient Procedure: 2D Echo, Cardiac Doppler and Color Doppler Indications:    I26.02 Pulmonary embolus  History:        Patient has prior history of Echocardiogram examinations, most                 recent 03/28/2022. Cardiomegaly, Signs/Symptoms:Dyspnea and                 Shortness of Breath; Risk Factors:Hypertension.  Sonographer:    Sheralyn Boatman RDCS Referring Phys: [phone] JACOB Manus Rudd  Sonographer Comments: Patient is obese. Image acquisition challenging due to patient body habitus. IMPRESSIONS  1. Left ventricular ejection fraction, by estimation, is 65 to 70%. Left ventricular ejection fraction by 2D MOD biplane is 68.8 %. The left ventricle has normal function. The left ventricle has no regional wall motion abnormalities. There is mild left ventricular hypertrophy. Left ventricular diastolic parameters are consistent with Grade I diastolic dysfunction (impaired relaxation).  2. Right ventricular systolic function is normal. The right ventricular size is normal. There is normal pulmonary artery systolic pressure. The estimated right ventricular systolic pressure is 29.9 mmHg.  3. The mitral valve is grossly normal. Trivial mitral valve regurgitation.  4. The aortic valve is tricuspid. Aortic valve regurgitation is not visualized.  5. Aortic dilatation noted. There is borderline dilatation of the ascending aorta, measuring 38 mm.  6. The inferior vena cava is dilated in size with <50% respiratory variability, suggesting right atrial pressure of 15 mmHg. Comparison(s): Changes from prior study are noted. 03/28/2022: LVEF >75%. Conclusion(s)/Recommendation(s): No evidence of  right heart strain. FINDINGS  Left Ventricle: Left ventricular ejection fraction, by estimation, is 65 to 70%. Left ventricular ejection fraction by 2D MOD biplane is 68.8 %. The left ventricle has normal function. The left ventricle has no regional wall motion abnormalities. The left ventricular internal cavity size was normal in size. There is mild left ventricular hypertrophy. Left ventricular diastolic parameters are consistent with Grade I diastolic dysfunction (impaired relaxation). Indeterminate filling pressures. Right Ventricle: The right ventricular size is normal. No increase in right ventricular wall thickness. Right ventricular systolic function is normal. There is normal pulmonary artery systolic pressure. The tricuspid regurgitant velocity is 1.93 m/s, and  with an assumed right atrial pressure of 15 mmHg, the estimated right ventricular systolic pressure is 29.9 mmHg. Left Atrium: Left atrial size was normal in size. Right Atrium: Right atrial size was normal in size. Pericardium: Trivial pericardial effusion is present. The pericardial effusion is circumferential. Mitral Valve: The mitral valve is grossly normal. Trivial mitral valve regurgitation. Tricuspid Valve: The tricuspid valve is grossly normal. Tricuspid valve regurgitation is trivial. Aortic Valve: The aortic valve is tricuspid. Aortic valve regurgitation is not visualized. Pulmonic Valve: The pulmonic valve was normal in structure. Pulmonic valve regurgitation is not visualized. Aorta: Aortic dilatation noted. There is borderline dilatation of the ascending aorta, measuring 38 mm. Venous: The inferior vena cava is dilated in size with less than 50% respiratory variability, suggesting right atrial pressure of 15 mmHg. IAS/Shunts: No atrial level shunt detected by color flow Doppler.  LEFT VENTRICLE PLAX 2D                        Biplane EF (MOD) LVIDd:         4.50 cm         LV Biplane EF:   Left LVIDs:         2.70 cm  ventricular LV PW:         1.10 cm                          ejection LV IVS:        1.20 cm                          fraction by LVOT diam:     2.40 cm                          2D MOD LV SV:         95                               biplane is LV SV Index:   42                               68.8 %. LVOT Area:     4.52 cm                                Diastology                                LV e' medial:    7.18 cm/s LV Volumes (MOD)               LV E/e' medial:  12.6 LV vol d, MOD    72.9 ml       LV e' lateral:   4.90 cm/s A2C:                           LV E/e' lateral: 18.4 LV vol d, MOD    52.1 ml A4C: LV vol s, MOD    20.2 ml A2C: LV vol s, MOD    19.4 ml A4C: LV SV MOD A2C:   52.7 ml LV SV MOD A4C:   52.1 ml LV SV MOD BP:    43.1 ml RIGHT VENTRICLE             IVC RV S prime:     13.20 cm/s  IVC diam: 2.40 cm TAPSE (M-mode): 2.3 cm LEFT ATRIUM             Index        RIGHT ATRIUM           Index LA diam:        3.10 cm 1.38 cm/m   RA Area:     12.60 cm LA Vol (A2C):   22.1 ml 9.84 ml/m   RA Volume:   30.00 ml  13.36 ml/m LA Vol (A4C):   34.3 ml 15.27 ml/m LA Biplane Vol: 29.1 ml 12.96 ml/m  AORTIC VALVE LVOT Vmax:   127.50 cm/s LVOT Vmean:  84.050 cm/s LVOT VTI:    0.211 m  AORTA Ao Root diam: 3.10 cm Ao Asc diam:  3.80 cm MITRAL VALVE               TRICUSPID VALVE MV Area (PHT): 3.66 cm    TR Peak grad:   14.9 mmHg MV Decel Time: 208  msec    TR Vmax:        193.00 cm/s MV E velocity: 90.35 cm/s MV A velocity: 76.75 cm/s  SHUNTS MV E/A ratio:  1.18        Systemic VTI:  0.21 m                            Systemic Diam: 2.40 cm Zoila Shutter MD Electronically signed by Zoila Shutter MD Signature Date/Time: 11/28/2022/3:33:46 PM    Final    US Venous Img Lower Bilateral (DVT)  Result Date: 11/28/2022 CLINICAL DATA:  Recent diagnosis of pulmonary embolism. Evaluate for DVT. EXAM: BILATERAL LOWER EXTREMITY VENOUS DOPPLER ULTRASOUND TECHNIQUE: Gray-scale sonography with graded compression, as well  as color Doppler and duplex ultrasound were performed to evaluate the lower extremity deep venous systems from the level of the common femoral vein and including the common femoral, femoral, profunda femoral, popliteal and calf veins including the posterior tibial, peroneal and gastrocnemius veins when visible. The superficial great saphenous vein was also interrogated. Spectral Doppler was utilized to evaluate flow at rest and with distal augmentation maneuvers in the common femoral, femoral and popliteal veins. COMPARISON:  None Available. FINDINGS: Examination degraded due to patient body habitus and poor sonographic window. RIGHT LOWER EXTREMITY Common Femoral Vein: No evidence of thrombus. Normal compressibility, respiratory phasicity and response to augmentation. Saphenofemoral Junction: No evidence of thrombus. Normal compressibility and flow on color Doppler imaging. Profunda Femoral Vein: No evidence of thrombus. Normal compressibility and flow on color Doppler imaging. Femoral Vein: No evidence of thrombus. Normal compressibility, respiratory phasicity and response to augmentation. Popliteal Vein: No evidence of thrombus. Normal compressibility, respiratory phasicity and response to augmentation. Calf Veins: Appear patent where visualized. Superficial Great Saphenous Vein: No evidence of thrombus. Normal compressibility. Other Findings:  None. LEFT LOWER EXTREMITY Common Femoral Vein: No evidence of thrombus. Normal compressibility, respiratory phasicity and response to augmentation. Saphenofemoral Junction: No evidence of thrombus. Normal compressibility and flow on color Doppler imaging. Profunda Femoral Vein: No evidence of thrombus. Normal compressibility and flow on color Doppler imaging. Femoral Vein: No evidence of thrombus. Normal compressibility, respiratory phasicity and response to augmentation. Popliteal Vein: No evidence of thrombus. Normal compressibility, respiratory phasicity and response  to augmentation. Calf Veins: Appear patent where visualized. Superficial Great Saphenous Vein: No evidence of thrombus. Normal compressibility. Other Findings:  None. IMPRESSION: No evidence of DVT within either lower extremity. Electronically Signed   By: Simonne Come M.D.   On: 11/28/2022 11:17   CT Angio Chest PE W and/or Wo Contrast  Result Date: 11/27/2022 CLINICAL DATA:  Chest pain, shortness of breath EXAM: CT ANGIOGRAPHY CHEST WITH CONTRAST TECHNIQUE: Multidetector CT imaging of the chest was performed using the standard protocol during bolus administration of intravenous contrast. Multiplanar CT image reconstructions and MIPs were obtained to evaluate the vascular anatomy. RADIATION DOSE REDUCTION: This exam was performed according to the departmental dose-optimization program which includes automated exposure control, adjustment of the mA and/or kV according to patient size and/or use of iterative reconstruction technique. CONTRAST:  75mL OMNIPAQUE IOHEXOL 350 MG/ML SOLN COMPARISON:  07/26/2022 FINDINGS: Cardiovascular: There is homogeneous enhancement in thoracic aorta. There is ectasia of main pulmonary artery measuring 3.7 cm suggesting pulmonary arterial hypertension. In image 70 of series 5, there is filling defect in a segmental pulmonary artery branch in right upper lobe. Evaluation of peripheral pulmonary artery branches is limited by extensive patchy infiltrates and breathing  motion artifacts. Evaluation of RV LV ratio is somewhat limited due to less than optimal contrast density in the right ventricular cavity. As far as seen, RV LV ratio appears to be more than 1 suggesting possible right heart strain. Heart is enlarged in size. Mediastinum/Nodes: There are slightly enlarged lymph nodes in mediastinum and hilar regions. There are enlarged lymph nodes in both axillary regions measuring up to 1.9 x 1.5 cm. Lungs/Pleura: Extensive patchy infiltrates are seen both lungs, more so in the lower lung  fields. There is minimal right pleural effusion. There is no pneumothorax. Upper Abdomen: No acute findings are seen. Musculoskeletal: No acute findings are seen. Review of the MIP images confirms the above findings. IMPRESSION: Intraluminal filling defects are seen in segmental branches in right upper lobe suggesting PE with small thrombus burden. Evaluation of other peripheral pulmonary artery branches is limited by extensive patchy infiltrates and breathing motion. No filling defects are seen in main pulmonary artery branches in the mediastinum. Main pulmonary artery measures 3.7 cm suggesting pulmonary arterial hypertension. There is possible prominence of right ventricle cavity in comparison to the left ventricular cavity suggesting right heart strain. Evaluation of size of right ventricular cavity is technically difficult due to less than optimal contrast density in the lumen. Extensive patchy infiltrates are seen in both lungs, more so in the lower lung fields suggesting multifocal atelectasis/pneumonia. Electronically Signed   By: Ernie Avena M.D.   On: 11/27/2022 15:32   DG Chest Port 1 View  Result Date: 11/27/2022 CLINICAL DATA:  Chest pain and shortness of breath. EXAM: PORTABLE CHEST 1 VIEW COMPARISON:  CT chest and chest x-ray dated July 26, 2022. FINDINGS: Unchanged cardiomegaly. Significantly decreased lung volumes with bibasilar opacities, likely atelectasis. No pneumothorax or large pleural effusion. No acute osseous abnormality. IMPRESSION: 1. Low lung volumes with bibasilar atelectasis. Electronically Signed   By: Obie Dredge M.D.   On: 11/27/2022 12:18    Microbiology: Results for orders placed or performed during the hospital encounter of 11/27/22  Culture, blood (Routine X 2) w Reflex to ID Panel     Status: None (Preliminary result)   Collection Time: 11/27/22  6:15 PM   Specimen: BLOOD RIGHT HAND  Result Value Ref Range Status   Specimen Description BLOOD RIGHT HAND   Final   Special Requests   Final    BOTTLES DRAWN AEROBIC AND ANAEROBIC Blood Culture adequate volume   Culture   Final    NO GROWTH 4 DAYS Performed at [facility], [address] [place], [place] [address]    Report Status PENDING  Incomplete  Culture, blood (Routine X 2) w Reflex to ID Panel     Status: None (Preliminary result)   Collection Time: 11/27/22  6:33 PM   Specimen: BLOOD RIGHT HAND  Result Value Ref Range Status   Specimen Description BLOOD RIGHT HAND  Final   Special Requests   Final    BOTTLES DRAWN AEROBIC AND ANAEROBIC Blood Culture adequate volume   Culture   Final    NO GROWTH 4 DAYS Performed at [facility], [address], [place], [place] [address]    Report Status PENDING  Incomplete  MRSA Next Gen by PCR, Nasal     Status: None   Collection Time: 11/27/22  6:50 PM   Specimen: Nasal Mucosa; Nasal Swab  Result Value Ref Range Status   MRSA by PCR Next Gen NOT DETECTED NOT DETECTED Final    Comment: (NOTE) The GeneXpert MRSA Assay (  FDA approved for NASAL specimens only), is one component of a comprehensive MRSA colonization surveillance program. It is not intended to diagnose MRSA infection nor to guide or monitor treatment for MRSA infections. Test performance is not FDA approved in patients less than 92 years old. Performed at [facility], [address], [place], [place] [address]     Labs: CBC: Recent Labs  Lab 11/27/22 1212 11/28/22 0550 11/29/22 0406 11/30/22 0457 12/01/22 0502  WBC 9.5 14.6* 13.9* 12.6* 8.9  NEUTROABS 7.8*  --   --   --   --   HGB 12.3 11.9* 11.3* 11.6* 12.5  HCT 43.2 42.8 39.7 42.2 43.5  MCV 87.6 89.5 87.6 90.8 87.7  PLT 261 251 235 195 229   Basic Metabolic Panel: Recent Labs  Lab 11/27/22 1212 11/28/22 0550 11/29/22 0406 11/30/22 0457 12/01/22 0502  NA 136 135 134* 135 139  K 3.5 4.1 4.5 4.6 4.6  CL 95* 93* 92* 93* 98  CO2 36* 33* 33* 33* 35*  GLUCOSE 122* 131* 117* 108* 89  BUN 14 20 33* 38* 30*   CREATININE 0.73 0.99 0.97 0.93 0.81  CALCIUM 8.7* 8.3* 8.6* 8.6* 8.7*  MG  --   --  2.5* 2.8* 2.7*   Liver Function Tests: Recent Labs  Lab 11/27/22 1212  AST 11*  ALT 11  ALKPHOS 47  BILITOT 0.6  PROT 8.7*  ALBUMIN 3.2*   CBG: No results for input(s): "GLUCAP" in the last 168 hours.  Discharge time spent: greater than 40  minutes.  Signed: Kendell Bane, MD [facility] 12/01/2022

## 2022-12-01 NOTE — Progress Notes (Signed)
   12/01/22 0518  Oxygen Therapy  SpO2 (!) 78 %  O2 Device Room Air (pt removed oxygen, educated)   Pt removed oxygen multiple times during the night, educated on importance of oxygen usage.

## 2022-12-02 ENCOUNTER — Telehealth: Payer: Self-pay | Admitting: Orthopedic Surgery

## 2022-12-02 DIAGNOSIS — J9621 Acute and chronic respiratory failure with hypoxia: Secondary | ICD-10-CM | POA: Diagnosis not present

## 2022-12-02 LAB — CULTURE, BLOOD (ROUTINE X 2)
Culture: NO GROWTH
Culture: NO GROWTH
Special Requests: ADEQUATE
Special Requests: ADEQUATE

## 2022-12-02 LAB — CBC
HCT: 45.4 % (ref 36.0–46.0)
Hemoglobin: 13 g/dL (ref 12.0–15.0)
MCH: 24.6 pg — ABNORMAL LOW (ref 26.0–34.0)
MCHC: 28.6 g/dL — ABNORMAL LOW (ref 30.0–36.0)
MCV: 85.8 fL (ref 80.0–100.0)
Platelets: 227 10*3/uL (ref 150–400)
RBC: 5.29 MIL/uL — ABNORMAL HIGH (ref 3.87–5.11)
RDW: 19.3 % — ABNORMAL HIGH (ref 11.5–15.5)
WBC: 7.9 10*3/uL (ref 4.0–10.5)
nRBC: 0 % (ref 0.0–0.2)

## 2022-12-02 MED ORDER — AZITHROMYCIN 250 MG PO TABS
500.0000 mg | ORAL_TABLET | Freq: Every day | ORAL | Status: DC
  Administered 2022-12-02: 500 mg via ORAL
  Filled 2022-12-02: qty 2

## 2022-12-02 MED ORDER — FUROSEMIDE 10 MG/ML IJ SOLN
40.0000 mg | Freq: Once | INTRAMUSCULAR | Status: AC
  Administered 2022-12-02: 40 mg via INTRAVENOUS
  Filled 2022-12-02: qty 4

## 2022-12-02 NOTE — TOC Transition Note (Addendum)
Transition of Care [facility]) - CM/SW Discharge Note   Patient Details  Name: Emma Morris MRN: [id number] Date of Birth: 30-May-1960  Transition of Care (TOC) CM/SW Contact:  Catalina Gravel, LCSW Phone Number: 12/02/2022, 12:16 PM   Clinical Narrative:    CSW attempted two additional providers for HHPT.  [facility] and [facility], awaiting response. TOC has attempted to refer pt to 4 providers. Pt up for DC, and will be contacted if a provider is found before that time.  TOC to follow.  Addendum: [facility] declined request.   [facility] declined- unable to assists. [facility] could not accept in that county.  CSW attempted to speak with pt at bedside, she was asleep.  Pt returned call, CSW left her a VM, unknowingly- thinking it was daughter's number. Shared with pt unable to secure a HHPT provider.   No further TOC needs.   Final next level of care: Home w Home Health Services Barriers to Discharge: No Barriers Identified   Patient Goals and CMS Choice CMS Medicare.gov Compare Post Acute Care list provided to:: Patient Choice offered to / list presented to : Patient  Discharge Placement                      Patient and family notified of of transfer: 12/01/22  Discharge Plan and Services Additional resources added to the After Visit Summary for       Post Acute Care Choice: Durable Medical Equipment, Home Health                      [facility] Agency: [facility] Date [facility] Agency Contacted: 12/01/22 Time HH Agency Contacted: 1049 Representative spoke with at [facility] Agency: Clifton Custard  Social Determinants of Health (SDOH) Interventions SDOH Screenings   Food Insecurity: No Food Insecurity (11/27/2022)  Housing: Low Risk  (11/27/2022)  Transportation Needs: No Transportation Needs (11/27/2022)  Utilities: Not At Risk (11/27/2022)  Financial Resource Strain: Medium Risk (07/26/2022)   Received from [facility]  Social Connections: Unknown (04/06/2022)   Received from  [facility]  Tobacco Use: Low Risk  (11/27/2022)     Readmission Risk Interventions    12/01/2022   10:47 AM 03/29/2022    2:27 PM  Readmission Risk Prevention Plan  Post Dischage Appt Complete Complete  Medication Screening Complete Complete  Transportation Screening Complete Complete

## 2022-12-02 NOTE — Plan of Care (Signed)
  Problem: Education: Goal: Knowledge of General Education information will improve Description: Including pain rating scale, medication(s)/side effects and non-pharmacologic comfort measures Outcome: Adequate for Discharge   Problem: Health Behavior/Discharge Planning: Goal: Ability to manage health-related needs will improve Outcome: Adequate for Discharge   Problem: Clinical Measurements: Goal: Ability to maintain clinical measurements within normal limits will improve Outcome: Adequate for Discharge Goal: Will remain free from infection Outcome: Adequate for Discharge Goal: Diagnostic test results will improve Outcome: Adequate for Discharge Goal: Respiratory complications will improve Outcome: Adequate for Discharge Goal: Cardiovascular complication will be avoided Outcome: Adequate for Discharge   Problem: Activity: Goal: Risk for activity intolerance will decrease Outcome: Adequate for Discharge   Problem: Nutrition: Goal: Adequate nutrition will be maintained Outcome: Adequate for Discharge   Problem: Coping: Goal: Level of anxiety will decrease Outcome: Adequate for Discharge   Problem: Elimination: Goal: Will not experience complications related to bowel motility Outcome: Adequate for Discharge Goal: Will not experience complications related to urinary retention Outcome: Adequate for Discharge   Problem: Pain Managment: Goal: General experience of comfort will improve Outcome: Adequate for Discharge   Problem: Safety: Goal: Ability to remain free from injury will improve Outcome: Adequate for Discharge   Problem: Skin Integrity: Goal: Risk for impaired skin integrity will decrease Outcome: Adequate for Discharge   Problem: Education: Goal: Knowledge of disease or condition will improve Outcome: Adequate for Discharge Goal: Knowledge of the prescribed therapeutic regimen will improve Outcome: Adequate for Discharge Goal: Individualized Educational  Video(s) Outcome: Adequate for Discharge   Problem: Activity: Goal: Ability to tolerate increased activity will improve Outcome: Adequate for Discharge Goal: Will verbalize the importance of balancing activity with adequate rest periods Outcome: Adequate for Discharge   Problem: Respiratory: Goal: Ability to maintain a clear airway will improve Outcome: Adequate for Discharge Goal: Levels of oxygenation will improve Outcome: Adequate for Discharge Goal: Ability to maintain adequate ventilation will improve Outcome: Adequate for Discharge   Problem: Activity: Goal: Ability to tolerate increased activity will improve Outcome: Adequate for Discharge   Problem: Clinical Measurements: Goal: Ability to maintain a body temperature in the normal range will improve Outcome: Adequate for Discharge   Problem: Respiratory: Goal: Ability to maintain adequate ventilation will improve Outcome: Adequate for Discharge Goal: Ability to maintain a clear airway will improve Outcome: Adequate for Discharge

## 2022-12-02 NOTE — Progress Notes (Signed)
PHARMACIST - PHYSICIAN COMMUNICATION CONCERNING: Antibiotic IV to Oral Route Change Policy  RECOMMENDATION: This patient is receiving azithromycin intravenously. Based on criteria approved by the Pharmacy and Therapeutics Committee, the antibiotic(s) is/are being converted to the equivalent dose of an oral formulation.   DESCRIPTION: These criteria include: Patient being treated for a respiratory tract infection, urinary tract infection, cellulitis or Clostridioides difficile-associated diarrhea if on metronidazole. The patient is not neutropenic and does not exhibit a malabsorptive GI state. The patient is eating (either orally or via tube) and/or has been taking other orally administered medications for at least 24 hours. The patient is improving clinically and has a 24-hour Tmax of <100.5 F.  If you have questions about this conversion, please contact the Pharmacy Department:  []   [phone] )  [facility] [x]   [phone] )  [facility] []   [phone] )  [facility]  []   [phone] )  [facility]   Will M. Dareen Piano, PharmD PGY-1 Pharmacy Resident 12/02/2022 9:24 AM

## 2022-12-02 NOTE — Care Management Important Message (Signed)
Important Message  Patient Details  Name: Emma Morris MRN: [id number] Date of Birth: 1960/07/02   Medicare Important Message Given:  Yes     Corey Harold 12/02/2022, 11:22 AM

## 2022-12-02 NOTE — Discharge Summary (Signed)
Physician Discharge Summary   Patient: Emma Morris MRN: [id number] DOB: [birth year]  Admit date:     11/27/2022  Discharge date: 12/02/22  Discharge Physician: Kendell Bane   PCP: Alvina Filbert, MD    The patient was seen and examined this morning -was held overnight for some nosebleed Shortness of breath with exertion and hypoxia on room air. Patient responded to IV Lasix, continue oxygen was applied.  Patient is cleared to be discharged home with home health.    Recommendations at discharge:   With PCP in 2 weeks 2.   Follow with a pulmonologist in 2-4 weeks 3.   Continue current medication including Eliquis, inhalers,  Discharge Diagnoses: Principal Problem:   Acute on chronic respiratory failure with hypoxia (HCC) Active Problems:   HTN (hypertension)   Asthma, chronic obstructive, with acute exacerbation (HCC)   CAP (community acquired pneumonia)   PE (pulmonary thromboembolism) (HCC)   MAHATI Morris is a 62 y.o. female with medical history significant of hypertension, obesity, asthma.  Patient was seen for chest pain or shortness of breath that started this morning.  She was admitted for acute on chronic hypoxemic respiratory failure noted to be multifactorial in the setting of PE as well as community-acquired pneumonia and associated acute COPD exacerbation.  She is overall improving and slowly weaning to her baseline 2 L nasal cannula.  Transition heparin drip to Eliquis today.  Anticipate discharge in the next 24 hours if further improved.   Assessment & Plan:   Principal Problem:   Acute on chronic respiratory failure with hypoxia (HCC) Active Problems:   HTN (hypertension)   Asthma, chronic obstructive, with acute exacerbation (HCC)   CAP (community acquired pneumonia)   PE (pulmonary thromboembolism) (HCC)        Acute on chronic respiratory failure with hypoxia Multifactorial Patient is very sick with high likelihood of  decline. Currently on 4 L nasal cannula, wean to baseline 1-2 L Okay for transfer to telemetry once bed available PE with RV strain Change heparin drip to Eliquis  Some RV strain noted on CT scan, however this was not seen on 2D echocardiogram.  LVEF 65 to 70% with no other acute abnormalities noted. Bilateral DVT studies negative CAP  Antibiotics: IV Rocephin and azithromycin>> switch to p.o. azithromycin Robitussin as needed  Blood cultures with no growth noted Sputum cultures no growth to date Improved leukocytosis Strep pneumonia negative and Legionella pending -no results yet Lactic acid 1.1 and procalcitonin less than 0.10 COPD exacerbation Antibiotics: as above DuoNeb's every 6 scheduled with albuterol every 2 when necessary Continue inhaled steroids and LA bronchodilator Solu-Medrol will be weaned to 40 mg IV twice daily today, can transition to prednisone  Mucinex 5.   Morbid obesity Body mass index is 54.31 kg/m. Referral to outpatient-PCP for weight loss management     Disposition: Home health Diet recommendation:  Discharge Diet Orders (From admission, onward)     Start     Ordered   12/01/22 0000  Diet - low sodium heart healthy        12/01/22 1035           Cardiac diet DISCHARGE MEDICATION: Allergies as of 12/02/2022   No Known Allergies      Medication List     STOP taking these medications    meloxicam 7.5 MG tablet Commonly known as: MOBIC       TAKE these medications    acetaminophen 500 MG tablet Commonly known as:  TYLENOL Take 1,000 mg by mouth every 8 (eight) hours as needed for moderate pain.   albuterol 108 (90 Base) MCG/ACT inhaler Commonly known as: VENTOLIN HFA Inhale 2 puffs into the lungs every 6 (six) hours as needed for wheezing or shortness of breath.   amLODipine 10 MG tablet Commonly known as: NORVASC Take 10 mg by mouth daily.   apixaban 5 MG Tabs tablet Commonly known as: ELIQUIS Take 2 tablets (10 mg  total) by mouth 2 (two) times daily for 7 days, THEN 1 tablet (5 mg total) 2 (two) times daily. Start taking on: December 01, 2022   azithromycin 500 MG tablet Commonly known as: Zithromax Take 1 tablet (500 mg total) by mouth daily for 5 days.   cholecalciferol 25 MCG (1000 UNIT) tablet Commonly known as: VITAMIN D3 Take 1,000 Units by mouth daily.   dextromethorphan 30 MG/5ML liquid Commonly known as: Delsym Take 5 mLs (30 mg total) by mouth 2 (two) times daily as needed for cough.   fluticasone 50 MCG/ACT nasal spray Commonly known as: FLONASE Place into both nostrils.   furosemide 40 MG tablet Commonly known as: Lasix Take 1 tablet (40 mg total) by mouth daily.   methylPREDNISolone 4 MG Tbpk tablet Commonly known as: MEDROL DOSEPAK Medrol Dosepak take as instructed               Durable Medical Equipment  (From admission, onward)           Start     Ordered   12/01/22 1032  DME Oxygen  Once       Question Answer Comment  Length of Need 12 Months   Liters per Minute 2   Oxygen delivery system Gas      12/01/22 1035            Discharge Exam: Filed Weights   11/27/22 1745 11/28/22 0500 11/30/22 0537  Weight: 132.6 kg 132.5 kg 134.7 kg    General:  AAO x 3,  cooperative, no distress;   HEENT:  Normocephalic, PERRL, otherwise with in Normal limits   Neuro:  CNII-XII intact. , normal motor and sensation, reflexes intact   Lungs:   Clear to auscultation BL, Respirations unlabored,  No wheezes / crackles  Cardio:    S1/S2, RRR, No murmure, No Rubs or Gallops   Abdomen:  Soft, non-tender, bowel sounds active all four quadrants, no guarding or peritoneal signs.  Muscular  skeletal:  Limited exam -global generalized weaknesses - in bed, able to move all 4 extremities,   2+ pulses,  symmetric, No pitting edema  Skin:  Dry, warm to touch, negative for any Rashes,  Wounds: Please see nursing documentation          Condition at discharge:  fair  The results of significant diagnostics from this hospitalization (including imaging, microbiology, ancillary and laboratory) are listed below for reference.   Imaging Studies: DG CHEST PORT 1 VIEW  Result Date: 12/01/2022 CLINICAL DATA:  Shortness of breath EXAM: PORTABLE CHEST 1 VIEW COMPARISON:  Chest radiograph 11/27/2022 FINDINGS: The heart is enlarged, unchanged. The upper mediastinal contours are grossly stable. Lung volumes remain low. Left worse than right basilar airspace opacities are again seen favored to reflect atelectasis. There is no definite overt pulmonary edema. Overall, aeration appears slightly improved compared to the prior study. There is no significant pleural effusion. There is no pneumothorax There is no acute osseous abnormality. IMPRESSION: Low lung volumes with probable bibasilar atelectasis, overall slightly improved compared  to the prior radiograph. Electronically Signed   By: Lesia Hausen M.D.   On: 12/01/2022 15:18   ECHOCARDIOGRAM COMPLETE  Result Date: 11/28/2022    ECHOCARDIOGRAM REPORT   Patient Name:   Adriana Reams Date of Exam: 11/28/2022 Medical Rec #:  [id number]          Height:       62.0 in Accession #:    [id number]         Weight:       292.1 lb Date of Birth:  May 05, 1961         BSA:          2.246 m Patient Age:    61 years           BP:           98/71 mmHg Patient Gender: F                  HR:           88 bpm. Exam Location:  Inpatient Procedure: 2D Echo, Cardiac Doppler and Color Doppler Indications:    I26.02 Pulmonary embolus  History:        Patient has prior history of Echocardiogram examinations, most                 recent 03/28/2022. Cardiomegaly, Signs/Symptoms:Dyspnea and                 Shortness of Breath; Risk Factors:Hypertension.  Sonographer:    Sheralyn Boatman RDCS Referring Phys: [phone] JACOB Manus Rudd  Sonographer Comments: Patient is obese. Image acquisition challenging due to patient body habitus. IMPRESSIONS  1. Left ventricular  ejection fraction, by estimation, is 65 to 70%. Left ventricular ejection fraction by 2D MOD biplane is 68.8 %. The left ventricle has normal function. The left ventricle has no regional wall motion abnormalities. There is mild left ventricular hypertrophy. Left ventricular diastolic parameters are consistent with Grade I diastolic dysfunction (impaired relaxation).  2. Right ventricular systolic function is normal. The right ventricular size is normal. There is normal pulmonary artery systolic pressure. The estimated right ventricular systolic pressure is 29.9 mmHg.  3. The mitral valve is grossly normal. Trivial mitral valve regurgitation.  4. The aortic valve is tricuspid. Aortic valve regurgitation is not visualized.  5. Aortic dilatation noted. There is borderline dilatation of the ascending aorta, measuring 38 mm.  6. The inferior vena cava is dilated in size with <50% respiratory variability, suggesting right atrial pressure of 15 mmHg. Comparison(s): Changes from prior study are noted. 03/28/2022: LVEF >75%. Conclusion(s)/Recommendation(s): No evidence of right heart strain. FINDINGS  Left Ventricle: Left ventricular ejection fraction, by estimation, is 65 to 70%. Left ventricular ejection fraction by 2D MOD biplane is 68.8 %. The left ventricle has normal function. The left ventricle has no regional wall motion abnormalities. The left ventricular internal cavity size was normal in size. There is mild left ventricular hypertrophy. Left ventricular diastolic parameters are consistent with Grade I diastolic dysfunction (impaired relaxation). Indeterminate filling pressures. Right Ventricle: The right ventricular size is normal. No increase in right ventricular wall thickness. Right ventricular systolic function is normal. There is normal pulmonary artery systolic pressure. The tricuspid regurgitant velocity is 1.93 m/s, and  with an assumed right atrial pressure of 15 mmHg, the estimated right ventricular  systolic pressure is 29.9 mmHg. Left Atrium: Left atrial size was normal in size. Right Atrium: Right atrial size was normal in size. Pericardium: Trivial  pericardial effusion is present. The pericardial effusion is circumferential. Mitral Valve: The mitral valve is grossly normal. Trivial mitral valve regurgitation. Tricuspid Valve: The tricuspid valve is grossly normal. Tricuspid valve regurgitation is trivial. Aortic Valve: The aortic valve is tricuspid. Aortic valve regurgitation is not visualized. Pulmonic Valve: The pulmonic valve was normal in structure. Pulmonic valve regurgitation is not visualized. Aorta: Aortic dilatation noted. There is borderline dilatation of the ascending aorta, measuring 38 mm. Venous: The inferior vena cava is dilated in size with less than 50% respiratory variability, suggesting right atrial pressure of 15 mmHg. IAS/Shunts: No atrial level shunt detected by color flow Doppler.  LEFT VENTRICLE PLAX 2D                        Biplane EF (MOD) LVIDd:         4.50 cm         LV Biplane EF:   Left LVIDs:         2.70 cm                          ventricular LV PW:         1.10 cm                          ejection LV IVS:        1.20 cm                          fraction by LVOT diam:     2.40 cm                          2D MOD LV SV:         95                               biplane is LV SV Index:   42                               68.8 %. LVOT Area:     4.52 cm                                Diastology                                LV e' medial:    7.18 cm/s LV Volumes (MOD)               LV E/e' medial:  12.6 LV vol d, MOD    72.9 ml       LV e' lateral:   4.90 cm/s A2C:                           LV E/e' lateral: 18.4 LV vol d, MOD    52.1 ml A4C: LV vol s, MOD    20.2 ml A2C: LV vol s, MOD    19.4 ml A4C: LV SV MOD A2C:   52.7 ml LV SV MOD A4C:   52.1 ml LV SV MOD BP:  43.1 ml RIGHT VENTRICLE             IVC RV S prime:     13.20 cm/s  IVC diam: 2.40 cm TAPSE (M-mode): 2.3 cm LEFT  ATRIUM             Index        RIGHT ATRIUM           Index LA diam:        3.10 cm 1.38 cm/m   RA Area:     12.60 cm LA Vol (A2C):   22.1 ml 9.84 ml/m   RA Volume:   30.00 ml  13.36 ml/m LA Vol (A4C):   34.3 ml 15.27 ml/m LA Biplane Vol: 29.1 ml 12.96 ml/m  AORTIC VALVE LVOT Vmax:   127.50 cm/s LVOT Vmean:  84.050 cm/s LVOT VTI:    0.211 m  AORTA Ao Root diam: 3.10 cm Ao Asc diam:  3.80 cm MITRAL VALVE               TRICUSPID VALVE MV Area (PHT): 3.66 cm    TR Peak grad:   14.9 mmHg MV Decel Time: 208 msec    TR Vmax:        193.00 cm/s MV E velocity: 90.35 cm/s MV A velocity: 76.75 cm/s  SHUNTS MV E/A ratio:  1.18        Systemic VTI:  0.21 m                            Systemic Diam: 2.40 cm Zoila Shutter MD Electronically signed by Zoila Shutter MD Signature Date/Time: 11/28/2022/3:33:46 PM    Final    US Venous Img Lower Bilateral (DVT)  Result Date: 11/28/2022 CLINICAL DATA:  Recent diagnosis of pulmonary embolism. Evaluate for DVT. EXAM: BILATERAL LOWER EXTREMITY VENOUS DOPPLER ULTRASOUND TECHNIQUE: Gray-scale sonography with graded compression, as well as color Doppler and duplex ultrasound were performed to evaluate the lower extremity deep venous systems from the level of the common femoral vein and including the common femoral, femoral, profunda femoral, popliteal and calf veins including the posterior tibial, peroneal and gastrocnemius veins when visible. The superficial great saphenous vein was also interrogated. Spectral Doppler was utilized to evaluate flow at rest and with distal augmentation maneuvers in the common femoral, femoral and popliteal veins. COMPARISON:  None Available. FINDINGS: Examination degraded due to patient body habitus and poor sonographic window. RIGHT LOWER EXTREMITY Common Femoral Vein: No evidence of thrombus. Normal compressibility, respiratory phasicity and response to augmentation. Saphenofemoral Junction: No evidence of thrombus. Normal compressibility and flow  on color Doppler imaging. Profunda Femoral Vein: No evidence of thrombus. Normal compressibility and flow on color Doppler imaging. Femoral Vein: No evidence of thrombus. Normal compressibility, respiratory phasicity and response to augmentation. Popliteal Vein: No evidence of thrombus. Normal compressibility, respiratory phasicity and response to augmentation. Calf Veins: Appear patent where visualized. Superficial Great Saphenous Vein: No evidence of thrombus. Normal compressibility. Other Findings:  None. LEFT LOWER EXTREMITY Common Femoral Vein: No evidence of thrombus. Normal compressibility, respiratory phasicity and response to augmentation. Saphenofemoral Junction: No evidence of thrombus. Normal compressibility and flow on color Doppler imaging. Profunda Femoral Vein: No evidence of thrombus. Normal compressibility and flow on color Doppler imaging. Femoral Vein: No evidence of thrombus. Normal compressibility, respiratory phasicity and response to augmentation. Popliteal Vein: No evidence of thrombus. Normal compressibility, respiratory phasicity and response to augmentation. Calf Veins: Appear patent where  visualized. Superficial Great Saphenous Vein: No evidence of thrombus. Normal compressibility. Other Findings:  None. IMPRESSION: No evidence of DVT within either lower extremity. Electronically Signed   By: Simonne Come M.D.   On: 11/28/2022 11:17   CT Angio Chest PE W and/or Wo Contrast  Result Date: 11/27/2022 CLINICAL DATA:  Chest pain, shortness of breath EXAM: CT ANGIOGRAPHY CHEST WITH CONTRAST TECHNIQUE: Multidetector CT imaging of the chest was performed using the standard protocol during bolus administration of intravenous contrast. Multiplanar CT image reconstructions and MIPs were obtained to evaluate the vascular anatomy. RADIATION DOSE REDUCTION: This exam was performed according to the departmental dose-optimization program which includes automated exposure control, adjustment of the mA  and/or kV according to patient size and/or use of iterative reconstruction technique. CONTRAST:  75mL OMNIPAQUE IOHEXOL 350 MG/ML SOLN COMPARISON:  07/26/2022 FINDINGS: Cardiovascular: There is homogeneous enhancement in thoracic aorta. There is ectasia of main pulmonary artery measuring 3.7 cm suggesting pulmonary arterial hypertension. In image 70 of series 5, there is filling defect in a segmental pulmonary artery branch in right upper lobe. Evaluation of peripheral pulmonary artery branches is limited by extensive patchy infiltrates and breathing motion artifacts. Evaluation of RV LV ratio is somewhat limited due to less than optimal contrast density in the right ventricular cavity. As far as seen, RV LV ratio appears to be more than 1 suggesting possible right heart strain. Heart is enlarged in size. Mediastinum/Nodes: There are slightly enlarged lymph nodes in mediastinum and hilar regions. There are enlarged lymph nodes in both axillary regions measuring up to 1.9 x 1.5 cm. Lungs/Pleura: Extensive patchy infiltrates are seen both lungs, more so in the lower lung fields. There is minimal right pleural effusion. There is no pneumothorax. Upper Abdomen: No acute findings are seen. Musculoskeletal: No acute findings are seen. Review of the MIP images confirms the above findings. IMPRESSION: Intraluminal filling defects are seen in segmental branches in right upper lobe suggesting PE with small thrombus burden. Evaluation of other peripheral pulmonary artery branches is limited by extensive patchy infiltrates and breathing motion. No filling defects are seen in main pulmonary artery branches in the mediastinum. Main pulmonary artery measures 3.7 cm suggesting pulmonary arterial hypertension. There is possible prominence of right ventricle cavity in comparison to the left ventricular cavity suggesting right heart strain. Evaluation of size of right ventricular cavity is technically difficult due to less than  optimal contrast density in the lumen. Extensive patchy infiltrates are seen in both lungs, more so in the lower lung fields suggesting multifocal atelectasis/pneumonia. Electronically Signed   By: Ernie Avena M.D.   On: 11/27/2022 15:32   DG Chest Port 1 View  Result Date: 11/27/2022 CLINICAL DATA:  Chest pain and shortness of breath. EXAM: PORTABLE CHEST 1 VIEW COMPARISON:  CT chest and chest x-ray dated July 26, 2022. FINDINGS: Unchanged cardiomegaly. Significantly decreased lung volumes with bibasilar opacities, likely atelectasis. No pneumothorax or large pleural effusion. No acute osseous abnormality. IMPRESSION: 1. Low lung volumes with bibasilar atelectasis. Electronically Signed   By: Obie Dredge M.D.   On: 11/27/2022 12:18    Microbiology: Results for orders placed or performed during the hospital encounter of 11/27/22  Culture, blood (Routine X 2) w Reflex to ID Panel     Status: None   Collection Time: 11/27/22  6:15 PM   Specimen: BLOOD RIGHT HAND  Result Value Ref Range Status   Specimen Description BLOOD RIGHT HAND  Final   Special Requests   Final  BOTTLES DRAWN AEROBIC AND ANAEROBIC Blood Culture adequate volume   Culture   Final    NO GROWTH 5 DAYS Performed at [facility], [address], [place], [place] [address]    Report Status 12/02/2022 FINAL  Final  Culture, blood (Routine X 2) w Reflex to ID Panel     Status: None   Collection Time: 11/27/22  6:33 PM   Specimen: BLOOD RIGHT HAND  Result Value Ref Range Status   Specimen Description BLOOD RIGHT HAND  Final   Special Requests   Final    BOTTLES DRAWN AEROBIC AND ANAEROBIC Blood Culture adequate volume   Culture   Final    NO GROWTH 5 DAYS Performed at [facility], [address], [place], [place] [address]    Report Status 12/02/2022 FINAL  Final  MRSA Next Gen by PCR, Nasal     Status: None   Collection Time: 11/27/22  6:50 PM   Specimen: Nasal Mucosa; Nasal Swab  Result Value Ref Range  Status   MRSA by PCR Next Gen NOT DETECTED NOT DETECTED Final    Comment: (NOTE) The GeneXpert MRSA Assay (FDA approved for NASAL specimens only), is one component of a comprehensive MRSA colonization surveillance program. It is not intended to diagnose MRSA infection nor to guide or monitor treatment for MRSA infections. Test performance is not FDA approved in patients less than 68 years old. Performed at [facility], [address], [place], [place] [address]     Labs: CBC: Recent Labs  Lab 11/27/22 1212 11/28/22 0550 11/29/22 0406 11/30/22 0457 12/01/22 0502 12/02/22 0419  WBC 9.5 14.6* 13.9* 12.6* 8.9 7.9  NEUTROABS 7.8*  --   --   --   --   --   HGB 12.3 11.9* 11.3* 11.6* 12.5 13.0  HCT 43.2 42.8 39.7 42.2 43.5 45.4  MCV 87.6 89.5 87.6 90.8 87.7 85.8  PLT 261 251 235 195 229 227   Basic Metabolic Panel: Recent Labs  Lab 11/27/22 1212 11/28/22 0550 11/29/22 0406 11/30/22 0457 12/01/22 0502  NA 136 135 134* 135 139  K 3.5 4.1 4.5 4.6 4.6  CL 95* 93* 92* 93* 98  CO2 36* 33* 33* 33* 35*  GLUCOSE 122* 131* 117* 108* 89  BUN 14 20 33* 38* 30*  CREATININE 0.73 0.99 0.97 0.93 0.81  CALCIUM 8.7* 8.3* 8.6* 8.6* 8.7*  MG  --   --  2.5* 2.8* 2.7*   Liver Function Tests: Recent Labs  Lab 11/27/22 1212  AST 11*  ALT 11  ALKPHOS 47  BILITOT 0.6  PROT 8.7*  ALBUMIN 3.2*   CBG: No results for input(s): "GLUCAP" in the last 168 hours.  Discharge time spent: greater than 40  minutes.  Signed: Kendell Bane, MD [facility] 12/02/2022

## 2022-12-26 ENCOUNTER — Emergency Department (HOSPITAL_COMMUNITY)
Admission: EM | Admit: 2022-12-26 | Discharge: 2022-12-28 | Disposition: A | Discharge status: Home / Self Care | Payer: 59 | Attending: Emergency Medicine | Admitting: Emergency Medicine

## 2022-12-26 ENCOUNTER — Emergency Department (HOSPITAL_COMMUNITY): Payer: 59

## 2022-12-26 ENCOUNTER — Other Ambulatory Visit: Payer: Self-pay

## 2022-12-26 ENCOUNTER — Encounter (HOSPITAL_COMMUNITY): Payer: Self-pay | Admitting: *Deleted

## 2022-12-26 DIAGNOSIS — E876 Hypokalemia: Secondary | ICD-10-CM | POA: Insufficient documentation

## 2022-12-26 DIAGNOSIS — J9611 Chronic respiratory failure with hypoxia: Secondary | ICD-10-CM | POA: Insufficient documentation

## 2022-12-26 DIAGNOSIS — I10 Essential (primary) hypertension: Secondary | ICD-10-CM | POA: Insufficient documentation

## 2022-12-26 DIAGNOSIS — Z79899 Other long term (current) drug therapy: Secondary | ICD-10-CM | POA: Insufficient documentation

## 2022-12-26 DIAGNOSIS — R0902 Hypoxemia: Secondary | ICD-10-CM | POA: Insufficient documentation

## 2022-12-26 DIAGNOSIS — J45909 Unspecified asthma, uncomplicated: Secondary | ICD-10-CM | POA: Insufficient documentation

## 2022-12-26 DIAGNOSIS — R0602 Shortness of breath: Secondary | ICD-10-CM | POA: Diagnosis not present

## 2022-12-26 DIAGNOSIS — Z7901 Long term (current) use of anticoagulants: Secondary | ICD-10-CM | POA: Diagnosis not present

## 2022-12-26 DIAGNOSIS — Z9981 Dependence on supplemental oxygen: Secondary | ICD-10-CM | POA: Diagnosis not present

## 2022-12-26 MED ORDER — IPRATROPIUM-ALBUTEROL 0.5-2.5 (3) MG/3ML IN SOLN
3.0000 mL | Freq: Once | RESPIRATORY_TRACT | Status: AC
  Administered 2022-12-27: 3 mL via RESPIRATORY_TRACT
  Filled 2022-12-26: qty 3

## 2022-12-26 NOTE — ED Triage Notes (Signed)
Pt arrived with [facility] after home concentrator not working. Initial oxygen sats low, improved after being placed on 2L. Denies pain at this time

## 2022-12-26 NOTE — ED Provider Notes (Incomplete)
[place] EMERGENCY DEPARTMENT AT [facility] Provider Note   CSN: [id number] Arrival date & time: 12/26/22  2322     History {Add pertinent medical, surgical, social history, OB history to HPI:1} No chief complaint on file.   Emma Morris is a 62 y.o. female.  The history is provided by the patient and the EMS personnel.  She has history of hypertension, asthma and was recently discharged from the hospital after being admitted for pneumonia with acute on chronic respiratory failure and comes in because her pulse oximetry at home showed 81%.  She is chronically on 2 L/minute of oxygen at home.  She has had a slight cough which is productive of some sputum which is clear to pale yellow.  She denies fever or chills.  She does admit to some slight fullness in her chest but no chest pain or pressure.  She states that she really did not feel more short of breath than normal, she only came in because her pulse oximetry was low.  Of note, EMS says that she is on a home oxygen concentrator that apparently was not functioning.   Home Medications Prior to Admission medications   Medication Sig Start Date End Date Taking? Authorizing Provider  acetaminophen (TYLENOL) 500 MG tablet Take 1,000 mg by mouth every 8 (eight) hours as needed for moderate pain.    [provider]  albuterol (PROVENTIL HFA;VENTOLIN HFA) 108 (90 Base) MCG/ACT inhaler Inhale 2 puffs into the lungs every 6 (six) hours as needed for wheezing or shortness of breath.    [provider]  amLODipine (NORVASC) 10 MG tablet Take 10 mg by mouth daily. 05/17/19   [provider]  apixaban (ELIQUIS) 5 MG TABS tablet Take 2 tablets (10 mg total) by mouth 2 (two) times daily for 7 days, THEN 1 tablet (5 mg total) 2 (two) times daily. 12/01/22 01/07/23  Kendell Bane, MD  cholecalciferol (VITAMIN D3) 25 MCG (1000 UNIT) tablet Take 1,000 Units by mouth daily.    [provider]   dextromethorphan (DELSYM) 30 MG/5ML liquid Take 5 mLs (30 mg total) by mouth 2 (two) times daily as needed for cough. 07/28/22   Rolly Salter, MD  fluticasone Aleda Grana) 50 MCG/ACT nasal spray Place into both nostrils. 01/27/21   [provider]  furosemide (LASIX) 40 MG tablet Take 1 tablet (40 mg total) by mouth daily. 12/01/22 12/01/23  Kendell Bane, MD  methylPREDNISolone (MEDROL DOSEPAK) 4 MG TBPK tablet Medrol Dosepak take as instructed 12/01/22   Kendell Bane, MD      Allergies    Patient has no known allergies.    Review of Systems   Review of Systems  All other systems reviewed and are negative.   Physical Exam Updated Vital Signs BP 133/79 (BP Location: Right Arm)   Pulse (!) 110   Temp 99.3 F (37.4 C) (Oral)   Resp 20   SpO2 99%  Physical Exam Vitals and nursing note reviewed.   62 year old female, resting comfortably and in no acute distress. Vital signs are significant for slightly elevated heart rate. Oxygen saturation is 99%, which is normal. Head is normocephalic and atraumatic. PERRLA, EOMI. Oropharynx is clear. Neck is nontender and supple without adenopathy or JVD. Back is nontender and there is no CVA tenderness. Lungs have diminished airflow throughout with some faint expiratory wheezes. Chest is nontender. Heart has regular rate and rhythm without murmur. Abdomen is soft, flat, nontender. Extremities  have trace edema, full range of motion is present. Skin is warm and dry without rash. Neurologic: Mental status is normal, cranial nerves are intact, moves all extremities equally.  ED Results / Procedures / Treatments   Labs (all labs ordered are listed, but only abnormal results are displayed) Labs Reviewed - No data to display  EKG EKG Interpretation Date/Time:  Sunday December 26 2022 23:37:55 EDT Ventricular Rate:  109 PR Interval:  156 QRS Duration:  96 QT Interval:  331 QTC Calculation: 446 R Axis:   51  Text  Interpretation: Sinus tachycardia Probable left atrial enlargement Low voltage, extremity leads Consider anterior infarct When compared with ECG of 12/01/2022, No significant change was found Confirmed by Dione Booze ([id number]) on 12/26/2022 11:40:18 PM  Radiology No results found.  Procedures Procedures  Cardiac monitor shows sinus tachycardia, per my interpretation.  Medications Ordered in ED Medications - No data to display  ED Course/ Medical Decision Making/ A&P   {   Click here for ABCD2, HEART and other calculatorsREFRESH Note before signing :1}                              Medical Decision Making  Hypoxia that apparently was secondary to malfunctioning home oxygen concentrator.  Her oxygen saturation here is adequate.  She does show evidence of slight bronchospasm and I have ordered a nebulizer treatment with albuterol and ipratropium.  I have ordered workup of chest x-ray, CBC, basic metabolic panel, BNP, troponin x 2.  I reviewed her past records and note hospitalization from 11/27/2022 through 12/02/2022 for hypoxia secondary to community acquired pneumonia superimposed on COPD.  Similar hospitalizations 07/26/2022-07/28/2022 and 03/27/2022-03/29/2022.  {Document critical care time when appropriate:1} {Document review of labs and clinical decision tools ie heart score, Chads2Vasc2 etc:1}  {Document your independent review of radiology images, and any outside records:1} {Document your discussion with family members, caretakers, and with consultants:1} {Document social determinants of health affecting pt's care:1} {Document your decision making why or why not admission, treatments were needed:1} Final Clinical Impression(s) / ED Diagnoses Final diagnoses:  None    Rx / DC Orders ED Discharge Orders     None

## 2022-12-27 LAB — MAGNESIUM: Magnesium: 2.1 mg/dL (ref 1.7–2.4)

## 2022-12-27 LAB — TROPONIN I (HIGH SENSITIVITY): Troponin I (High Sensitivity): 5 ng/L (ref <18)

## 2022-12-27 MED ORDER — POTASSIUM CHLORIDE 10 MEQ/100ML IV SOLN
10.0000 meq | INTRAVENOUS | Status: AC
  Administered 2022-12-27 (×4): 10 meq via INTRAVENOUS
  Filled 2022-12-27 (×4): qty 100

## 2022-12-27 MED ORDER — POTASSIUM CHLORIDE CRYS ER 20 MEQ PO TBCR: EXTENDED_RELEASE_TABLET | ORAL | 0 refills | Status: DC

## 2022-12-27 MED ORDER — POTASSIUM CHLORIDE CRYS ER 20 MEQ PO TBCR
40.0000 meq | EXTENDED_RELEASE_TABLET | Freq: Once | ORAL | Status: AC
  Administered 2022-12-27: 40 meq via ORAL
  Filled 2022-12-27: qty 2

## 2022-12-27 NOTE — ED Notes (Signed)
Received TOC consult stating pt in need of new O2 concentrator at home. Reviewed pt's record. TOC arranged home O2 with [employer] for pt last year.   Contacted Ashley at [facility] to request assistance. Per Morrie Sheldon, they will reach out to pt/family to schedule visit to pt's home within 4 hours of ED discharge to replace the concentrator. If pt needs portable tank for dc, TOC can provide.  Updated pt's RN.

## 2022-12-27 NOTE — Discharge Instructions (Addendum)
Your potassium is very low today.  This will need to be followed by your primary care provider.  I have ordered a prescription for potassium for you to take at home.  Because you are taking furosemide (Lasix), you will probably need to take potassium on an ongoing basis.  [employer] will call to replace your oxygen concentrator. Use portable tank in the meantime.   Return to the ER for any new or worsening symptoms of concern.

## 2022-12-27 NOTE — ED Notes (Signed)
Date and time results received: 12/27/22 109 (use smartphrase ".now" to insert current time)  Test: k Critical Value: 2.4  Name of Provider Notified: Preston Fleeting  Orders Received? Or Actions Taken?: none at this time

## 2022-12-27 NOTE — ED Notes (Signed)
Pt in room sleeping. Went in and cleaned floor as it was full of things and took out the trash.

## 2022-12-27 NOTE — ED Notes (Signed)
Pt in process of contacting ride to pick her up upon discharge

## 2022-12-27 NOTE — ED Notes (Signed)
Pt placed on bedpan and she voided urine, and it got all over bed. Linen changed.

## 2023-01-02 ENCOUNTER — Encounter (HOSPITAL_COMMUNITY): Payer: Self-pay | Admitting: Emergency Medicine

## 2023-01-02 ENCOUNTER — Inpatient Hospital Stay (HOSPITAL_COMMUNITY)
Admission: EM | Admit: 2023-01-02 | Discharge: 2023-01-08 | DRG: 291 | Disposition: A | Discharge status: Home Health | Payer: 59 | Attending: Family Medicine | Admitting: Family Medicine

## 2023-01-02 ENCOUNTER — Other Ambulatory Visit: Payer: Self-pay

## 2023-01-02 ENCOUNTER — Emergency Department (HOSPITAL_COMMUNITY): Payer: 59

## 2023-01-02 DIAGNOSIS — I2489 Other forms of acute ischemic heart disease: Secondary | ICD-10-CM | POA: Diagnosis present

## 2023-01-02 DIAGNOSIS — J9621 Acute and chronic respiratory failure with hypoxia: Secondary | ICD-10-CM | POA: Diagnosis present

## 2023-01-02 DIAGNOSIS — Z809 Family history of malignant neoplasm, unspecified: Secondary | ICD-10-CM

## 2023-01-02 DIAGNOSIS — D75839 Thrombocytosis, unspecified: Secondary | ICD-10-CM | POA: Diagnosis present

## 2023-01-02 DIAGNOSIS — J810 Acute pulmonary edema: Secondary | ICD-10-CM | POA: Diagnosis present

## 2023-01-02 DIAGNOSIS — I110 Hypertensive heart disease with heart failure: Secondary | ICD-10-CM | POA: Diagnosis present

## 2023-01-02 DIAGNOSIS — J9622 Acute and chronic respiratory failure with hypercapnia: Secondary | ICD-10-CM | POA: Diagnosis present

## 2023-01-02 DIAGNOSIS — M1990 Unspecified osteoarthritis, unspecified site: Secondary | ICD-10-CM | POA: Diagnosis present

## 2023-01-02 DIAGNOSIS — Z6841 Body Mass Index (BMI) 40.0 and over, adult: Secondary | ICD-10-CM

## 2023-01-02 DIAGNOSIS — N179 Acute kidney failure, unspecified: Secondary | ICD-10-CM | POA: Diagnosis present

## 2023-01-02 DIAGNOSIS — E662 Morbid (severe) obesity with alveolar hypoventilation: Secondary | ICD-10-CM | POA: Diagnosis present

## 2023-01-02 DIAGNOSIS — J441 Chronic obstructive pulmonary disease with (acute) exacerbation: Secondary | ICD-10-CM | POA: Diagnosis present

## 2023-01-02 DIAGNOSIS — Z841 Family history of disorders of kidney and ureter: Secondary | ICD-10-CM | POA: Diagnosis not present

## 2023-01-02 DIAGNOSIS — E876 Hypokalemia: Secondary | ICD-10-CM | POA: Diagnosis not present

## 2023-01-02 DIAGNOSIS — Z833 Family history of diabetes mellitus: Secondary | ICD-10-CM

## 2023-01-02 DIAGNOSIS — Z1152 Encounter for screening for COVID-19: Secondary | ICD-10-CM | POA: Diagnosis not present

## 2023-01-02 DIAGNOSIS — J9602 Acute respiratory failure with hypercapnia: Secondary | ICD-10-CM | POA: Diagnosis present

## 2023-01-02 DIAGNOSIS — I5033 Acute on chronic diastolic (congestive) heart failure: Secondary | ICD-10-CM | POA: Diagnosis present

## 2023-01-02 DIAGNOSIS — Z79899 Other long term (current) drug therapy: Secondary | ICD-10-CM

## 2023-01-02 DIAGNOSIS — Z86711 Personal history of pulmonary embolism: Secondary | ICD-10-CM | POA: Diagnosis present

## 2023-01-02 DIAGNOSIS — R7989 Other specified abnormal findings of blood chemistry: Secondary | ICD-10-CM | POA: Diagnosis not present

## 2023-01-02 DIAGNOSIS — D649 Anemia, unspecified: Secondary | ICD-10-CM | POA: Diagnosis present

## 2023-01-02 DIAGNOSIS — J9601 Acute respiratory failure with hypoxia: Secondary | ICD-10-CM | POA: Diagnosis not present

## 2023-01-02 DIAGNOSIS — E8809 Other disorders of plasma-protein metabolism, not elsewhere classified: Secondary | ICD-10-CM | POA: Diagnosis present

## 2023-01-02 DIAGNOSIS — I2721 Secondary pulmonary arterial hypertension: Secondary | ICD-10-CM | POA: Diagnosis present

## 2023-01-02 DIAGNOSIS — Z9981 Dependence on supplemental oxygen: Secondary | ICD-10-CM

## 2023-01-02 DIAGNOSIS — I4891 Unspecified atrial fibrillation: Secondary | ICD-10-CM | POA: Diagnosis not present

## 2023-01-02 DIAGNOSIS — I10 Essential (primary) hypertension: Secondary | ICD-10-CM | POA: Diagnosis not present

## 2023-01-02 DIAGNOSIS — Z7901 Long term (current) use of anticoagulants: Secondary | ICD-10-CM

## 2023-01-02 DIAGNOSIS — I4819 Other persistent atrial fibrillation: Secondary | ICD-10-CM | POA: Diagnosis present

## 2023-01-02 HISTORY — DX: Other pulmonary embolism without acute cor pulmonale: I26.99

## 2023-01-02 HISTORY — DX: Morbid (severe) obesity with alveolar hypoventilation: E66.2

## 2023-01-02 HISTORY — DX: Respiratory failure, unspecified with hypoxia: J96.91

## 2023-01-02 LAB — TROPONIN I (HIGH SENSITIVITY)
Troponin I (High Sensitivity): 40 ng/L — ABNORMAL HIGH (ref <18)
Troponin I (High Sensitivity): 38 ng/L — ABNORMAL HIGH (ref <18)

## 2023-01-02 LAB — BLOOD GAS, VENOUS
Acid-Base Excess: 13.8 mmol/L — ABNORMAL HIGH (ref 0.0–2.0)
Acid-Base Excess: 14.2 mmol/L — ABNORMAL HIGH (ref 0.0–2.0)
Acid-Base Excess: 10.1 mmol/L — ABNORMAL HIGH (ref 0.0–2.0)
Bicarbonate: 42.9 mmol/L — ABNORMAL HIGH (ref 20.0–28.0)
Bicarbonate: 44.3 mmol/L — ABNORMAL HIGH (ref 20.0–28.0)
Bicarbonate: 38.4 mmol/L — ABNORMAL HIGH (ref 20.0–28.0)
Drawn by: 68921
Drawn by: 6892
Drawn by: 1905
O2 Saturation: 84.3 %
O2 Saturation: 57.4 %
O2 Saturation: 93.9 %
Patient temperature: 36.7
Patient temperature: 36.7
Patient temperature: 36.7
pCO2, Ven: 75 mmHg (ref 44–60)
pCO2, Ven: 83 mmHg (ref 44–60)
pCO2, Ven: 67 mmHg — ABNORMAL HIGH (ref 44–60)
pH, Ven: 7.36 (ref 7.25–7.43)
pH, Ven: 7.33 (ref 7.25–7.43)
pH, Ven: 7.36 (ref 7.25–7.43)
pO2, Ven: 53 mmHg — ABNORMAL HIGH (ref 32–45)
pO2, Ven: 35 mmHg (ref 32–45)
pO2, Ven: 60 mmHg — ABNORMAL HIGH (ref 32–45)

## 2023-01-02 LAB — BASIC METABOLIC PANEL
Anion gap: 7 (ref 5–15)
BUN: 25 mg/dL — ABNORMAL HIGH (ref 8–23)
CO2: 35 mmol/L — ABNORMAL HIGH (ref 22–32)
Calcium: 8.3 mg/dL — ABNORMAL LOW (ref 8.9–10.3)
Chloride: 93 mmol/L — ABNORMAL LOW (ref 98–111)
Creatinine, Ser: 1.44 mg/dL — ABNORMAL HIGH (ref 0.44–1.00)
GFR, Estimated: 41 mL/min — ABNORMAL LOW (ref >60)
Glucose, Bld: 124 mg/dL — ABNORMAL HIGH (ref 70–99)
Potassium: 4.4 mmol/L (ref 3.5–5.1)
Sodium: 135 mmol/L (ref 135–145)

## 2023-01-02 LAB — CBC WITH DIFFERENTIAL/PLATELET
Abs Immature Granulocytes: 0.1 10*3/uL — ABNORMAL HIGH (ref 0.00–0.07)
Basophils Absolute: 0 10*3/uL (ref 0.0–0.1)
Basophils Relative: 0 %
Eosinophils Absolute: 0.1 10*3/uL (ref 0.0–0.5)
Eosinophils Relative: 1 %
HCT: 38.7 % (ref 36.0–46.0)
Hemoglobin: 10.9 g/dL — ABNORMAL LOW (ref 12.0–15.0)
Immature Granulocytes: 1 %
Lymphocytes Relative: 9 %
Lymphs Abs: 1 10*3/uL (ref 0.7–4.0)
MCH: 24.2 pg — ABNORMAL LOW (ref 26.0–34.0)
MCHC: 28.2 g/dL — ABNORMAL LOW (ref 30.0–36.0)
MCV: 85.8 fL (ref 80.0–100.0)
Monocytes Absolute: 1 10*3/uL (ref 0.1–1.0)
Monocytes Relative: 9 %
Neutro Abs: 8.8 10*3/uL — ABNORMAL HIGH (ref 1.7–7.7)
Neutrophils Relative %: 80 %
Platelets: 396 10*3/uL (ref 150–400)
RBC: 4.51 MIL/uL (ref 3.87–5.11)
RDW: 18.1 % — ABNORMAL HIGH (ref 11.5–15.5)
WBC: 10.9 10*3/uL — ABNORMAL HIGH (ref 4.0–10.5)
nRBC: 0 % (ref 0.0–0.2)

## 2023-01-02 LAB — SARS CORONAVIRUS 2 BY RT PCR: SARS Coronavirus 2 by RT PCR: NEGATIVE

## 2023-01-02 LAB — BRAIN NATRIURETIC PEPTIDE: B Natriuretic Peptide: 33 pg/mL (ref 0.0–100.0)

## 2023-01-02 MED ORDER — IPRATROPIUM-ALBUTEROL 0.5-2.5 (3) MG/3ML IN SOLN
3.0000 mL | RESPIRATORY_TRACT | Status: AC
  Administered 2023-01-02 (×2): 3 mL via RESPIRATORY_TRACT
  Filled 2023-01-02: qty 3

## 2023-01-02 MED ORDER — AMLODIPINE BESYLATE 5 MG PO TABS
10.0000 mg | ORAL_TABLET | Freq: Every day | ORAL | Status: DC
  Administered 2023-01-03: 10 mg via ORAL
  Filled 2023-01-02: qty 2

## 2023-01-02 MED ORDER — ACETAMINOPHEN 650 MG RE SUPP: 650.0000 mg | Freq: Four times a day (QID) | RECTAL | Status: DC | PRN

## 2023-01-02 MED ORDER — OXYCODONE HCL 5 MG PO TABS
5.0000 mg | ORAL_TABLET | ORAL | Status: DC | PRN
  Administered 2023-01-03: 5 mg via ORAL
  Filled 2023-01-02: qty 1

## 2023-01-02 MED ORDER — PREDNISONE 20 MG PO TABS: 40.0000 mg | ORAL_TABLET | Freq: Every day | ORAL | Status: DC

## 2023-01-02 MED ORDER — ACETAMINOPHEN 325 MG PO TABS
650.0000 mg | ORAL_TABLET | Freq: Four times a day (QID) | ORAL | Status: DC | PRN
  Administered 2023-01-03 – 2023-01-05 (×3): 650 mg via ORAL
  Filled 2023-01-02 (×3): qty 2

## 2023-01-02 MED ORDER — METHYLPREDNISOLONE SODIUM SUCC 125 MG IJ SOLR
125.0000 mg | Freq: Once | INTRAMUSCULAR | Status: AC
  Administered 2023-01-02: 125 mg via INTRAVENOUS
  Filled 2023-01-02: qty 2

## 2023-01-02 MED ORDER — FUROSEMIDE 10 MG/ML IJ SOLN
40.0000 mg | Freq: Two times a day (BID) | INTRAMUSCULAR | Status: DC
  Administered 2023-01-03 – 2023-01-06 (×7): 40 mg via INTRAVENOUS
  Filled 2023-01-02 (×7): qty 4

## 2023-01-02 MED ORDER — IPRATROPIUM-ALBUTEROL 0.5-2.5 (3) MG/3ML IN SOLN
3.0000 mL | Freq: Once | RESPIRATORY_TRACT | Status: AC
  Administered 2023-01-02: 3 mL via RESPIRATORY_TRACT
  Filled 2023-01-02: qty 9

## 2023-01-02 MED ORDER — SODIUM CHLORIDE 0.9 % IV BOLUS
500.0000 mL | Freq: Once | INTRAVENOUS | Status: AC
  Administered 2023-01-02: 500 mL via INTRAVENOUS

## 2023-01-02 MED ORDER — APIXABAN 5 MG PO TABS
5.0000 mg | ORAL_TABLET | Freq: Two times a day (BID) | ORAL | Status: DC
  Administered 2023-01-03 – 2023-01-08 (×11): 5 mg via ORAL
  Filled 2023-01-02 (×11): qty 1

## 2023-01-02 MED ORDER — MORPHINE SULFATE (PF) 2 MG/ML IV SOLN: 2.0000 mg | INTRAVENOUS | Status: DC | PRN

## 2023-01-02 MED ORDER — METHYLPREDNISOLONE SODIUM SUCC 125 MG IJ SOLR
125.0000 mg | Freq: Two times a day (BID) | INTRAMUSCULAR | Status: DC
  Administered 2023-01-03: 125 mg via INTRAVENOUS
  Filled 2023-01-02: qty 2

## 2023-01-02 MED ORDER — ONDANSETRON HCL 4 MG/2ML IJ SOLN
4.0000 mg | Freq: Four times a day (QID) | INTRAMUSCULAR | Status: DC | PRN
  Administered 2023-01-03 – 2023-01-04 (×2): 4 mg via INTRAVENOUS
  Filled 2023-01-02 (×3): qty 2

## 2023-01-02 MED ORDER — CHLORHEXIDINE GLUCONATE CLOTH 2 % EX PADS
6.0000 | MEDICATED_PAD | Freq: Every day | CUTANEOUS | Status: DC
  Administered 2023-01-03 – 2023-01-06 (×4): 6 via TOPICAL

## 2023-01-02 MED ORDER — IPRATROPIUM-ALBUTEROL 0.5-2.5 (3) MG/3ML IN SOLN
3.0000 mL | Freq: Four times a day (QID) | RESPIRATORY_TRACT | Status: DC
  Administered 2023-01-02 – 2023-01-06 (×17): 3 mL via RESPIRATORY_TRACT
  Filled 2023-01-02 (×16): qty 3

## 2023-01-02 MED ORDER — IPRATROPIUM-ALBUTEROL 0.5-2.5 (3) MG/3ML IN SOLN
RESPIRATORY_TRACT | Status: AC
  Filled 2023-01-02: qty 3

## 2023-01-02 MED ORDER — ONDANSETRON HCL 4 MG PO TABS: 4.0000 mg | ORAL_TABLET | Freq: Four times a day (QID) | ORAL | Status: DC | PRN

## 2023-01-02 MED ORDER — ALBUTEROL SULFATE (2.5 MG/3ML) 0.083% IN NEBU
INHALATION_SOLUTION | RESPIRATORY_TRACT | Status: AC
  Filled 2023-01-02: qty 3

## 2023-01-02 MED ORDER — FUROSEMIDE 10 MG/ML IJ SOLN
60.0000 mg | Freq: Once | INTRAMUSCULAR | Status: AC
  Administered 2023-01-02: 60 mg via INTRAVENOUS
  Filled 2023-01-02: qty 6

## 2023-01-02 MED ORDER — ALBUTEROL SULFATE (2.5 MG/3ML) 0.083% IN NEBU
2.5000 mg | INHALATION_SOLUTION | RESPIRATORY_TRACT | Status: DC | PRN
  Administered 2023-01-02: 2.5 mg via RESPIRATORY_TRACT

## 2023-01-02 NOTE — ED Notes (Signed)
MD at bedside, RT called for Bipap

## 2023-01-02 NOTE — ED Notes (Signed)
Pt is A&Ox4 

## 2023-01-02 NOTE — ED Notes (Signed)
RT at bedside placing pt on Bipap.

## 2023-01-02 NOTE — ED Notes (Signed)
Pt appears to have a wet, productive cough

## 2023-01-02 NOTE — ED Triage Notes (Signed)
Pt arrived via [facility] from home with c/o SOB. Pt is baseline on 2 L of o2 at home. Hx of COPD, CHF, A fib, and HTN. Per EMS, they placed pt on 4 L [place] and SpO2 went up to 90%. 120 HR and dry cough. Pt currently in the 70s on 5 L [place] so this RN placed a non-rebreather on pt at 11 and SpO2 is not 94%

## 2023-01-02 NOTE — ED Provider Notes (Signed)
[place] EMERGENCY DEPARTMENT AT [facility] Provider Note   CSN: [id number] Arrival date & time: 01/02/23  1416     History  Chief Complaint  Patient presents with   Respiratory Distress    Emma Morris is a 62 y.o. female with asthma/COPD, CHF, A fib, PE on Eliquis, and HTN who presents [facility] from home with c/o SOB. Pt is baseline on 2 L of O2 at home. Per EMS, they placed pt on 4 L [place] and SpO2 went up to 90%. 120 HR and dry cough. Pt currently in the 70s% on 5 L [place] so placed on NRB on arrival, SpO2 94%. Patient states she has felt SOB for a few days. Denies CP, leg swelling. Endorses some increased cough, denies f/c or sick contacts. Patient is mildly somnolent on arrival but is easily arousable to voice.   Per chart review patient was recently admitted from 11/27/2022 to 12/02/2022 with acute on chronic hypoxemic respiratory failure multifactorial in origin including PE as well as CAP and COPD exacerbation.  CT scan from 11/27/2022 demonstrates right upper lobe PE with small thrombus burden, as well as findings of pulmonary arterial hypertension and pneumonia.  Started on antibiotics and Eliquis.  Subsequently presented to the ED on 12/26/2022 for hypoxia at home likely due to malfunctioning home oxygen concentrator.   Past Medical History:  Diagnosis Date   Anemia    Arthritis    Asthma    Hypertension    Knee pain    Obesity        Home Medications Prior to Admission medications   Medication Sig Start Date End Date Taking? Authorizing Provider  acetaminophen (TYLENOL) 500 MG tablet Take 1,000 mg by mouth every 8 (eight) hours as needed for moderate pain.    [provider]  albuterol (PROVENTIL HFA;VENTOLIN HFA) 108 (90 Base) MCG/ACT inhaler Inhale 2 puffs into the lungs every 6 (six) hours as needed for wheezing or shortness of breath.    [provider]  amLODipine (NORVASC) 10 MG tablet Take 10 mg by mouth daily. 05/17/19   [provider]  apixaban (ELIQUIS) 5 MG TABS tablet Take 2 tablets (10 mg total) by mouth 2 (two) times daily for 7 days, THEN 1 tablet (5 mg total) 2 (two) times daily. 12/01/22 01/07/23  Kendell Bane, MD  cholecalciferol (VITAMIN D3) 25 MCG (1000 UNIT) tablet Take 1,000 Units by mouth daily.    [provider]  dextromethorphan (DELSYM) 30 MG/5ML liquid Take 5 mLs (30 mg total) by mouth 2 (two) times daily as needed for cough. 07/28/22   Rolly Salter, MD  fluticasone Aleda Grana) 50 MCG/ACT nasal spray Place into both nostrils. 01/27/21   [provider]  furosemide (LASIX) 40 MG tablet Take 1 tablet (40 mg total) by mouth daily. 12/01/22 12/01/23  Kendell Bane, MD  methylPREDNISolone (MEDROL DOSEPAK) 4 MG TBPK tablet Medrol Dosepak take as instructed 12/01/22   Kendell Bane, MD  potassium chloride SA (KLOR-CON M) 20 MEQ tablet Take 1 tablet twice a day for 2 weeks, then 1 tablet every day 12/27/22   Dione Booze, MD      Allergies    Patient has no known allergies.    Review of Systems   Review of Systems A 10 point review of systems was performed and is negative unless otherwise reported in HPI.  Physical Exam Updated Vital Signs BP 110/78   Pulse (!) 106   Temp 98 F (  36.7 C) (Axillary)   Resp (!) 29   SpO2 92%  Physical Exam General: Chronically ill-appearing obese female, lying in bed.  HEENT: Sclera anicteric, MMM, trachea midline.  Cardiology: regular tachycardic rate, no murmurs/rubs/gallops.  Resp: Mildly tachypneic w/ moderately increased resp rate on NRB, quiet lung sounds bilaterally.  Abd: Soft, non-tender, non-distended. No rebound tenderness or guarding.  GU: Deferred. MSK: No peripheral edema or signs of trauma. Extremities without deformity or TTP. No cyanosis or clubbing. Skin: warm, dry. Neuro: Oriented x 4 but somnolent, easily arousable to voice, CNs II-XII grossly intact. MAEs. Sensation grossly intact.  Psych: Somnolent  ED  Results / Procedures / Treatments   Labs (all labs ordered are listed, but only abnormal results are displayed) Labs Reviewed  BASIC METABOLIC PANEL - Abnormal; Notable for the following components:      Result Value   Chloride 93 (*)    CO2 35 (*)    Glucose, Bld 124 (*)    BUN 25 (*)    Creatinine, Ser 1.44 (*)    Calcium 8.3 (*)    GFR, Estimated 41 (*)    All other components within normal limits  CBC WITH DIFFERENTIAL/PLATELET - Abnormal; Notable for the following components:   WBC 10.9 (*)    Hemoglobin 10.9 (*)    MCH 24.2 (*)    MCHC 28.2 (*)    RDW 18.1 (*)    Neutro Abs 8.8 (*)    Abs Immature Granulocytes 0.10 (*)    All other components within normal limits  BLOOD GAS, VENOUS - Abnormal; Notable for the following components:   pCO2, Ven 75 (*)    pO2, Ven 53 (*)    Bicarbonate 42.9 (*)    Acid-Base Excess 13.8 (*)    All other components within normal limits  BLOOD GAS, VENOUS - Abnormal; Notable for the following components:   pCO2, Ven 83 (*)    Bicarbonate 44.3 (*)    Acid-Base Excess 14.2 (*)    All other components within normal limits  BLOOD GAS, VENOUS - Abnormal; Notable for the following components:   pCO2, Ven 67 (*)    pO2, Ven 60 (*)    Bicarbonate 38.4 (*)    Acid-Base Excess 10.1 (*)    All other components within normal limits  TROPONIN I (HIGH SENSITIVITY) - Abnormal; Notable for the following components:   Troponin I (High Sensitivity) 40 (*)    All other components within normal limits  TROPONIN I (HIGH SENSITIVITY) - Abnormal; Notable for the following components:   Troponin I (High Sensitivity) 38 (*)    All other components within normal limits  SARS CORONAVIRUS 2 BY RT PCR  BRAIN NATRIURETIC PEPTIDE    EKG EKG Interpretation Date/Time:  Sunday January 02 2023 14:34:11 EDT Ventricular Rate:  123 PR Interval:  137 QRS Duration:  85 QT Interval:  288 QTC Calculation: 412 R Axis:   25  Text Interpretation: Sinus tachycardia Low  voltage, extremity and precordial leads Confirmed by Vivi Barrack [phone]) on 01/02/2023 9:06:44 PM  Radiology DG Chest Port 1 View  Result Date: 01/02/2023 CLINICAL DATA:  Shortness of breath EXAM: PORTABLE CHEST 1 VIEW COMPARISON:  12/26/2022 FINDINGS: Gross cardiomegaly. Diffuse bilateral interstitial pulmonary opacity. Small layering pleural effusions. The visualized skeletal structures are unremarkable. IMPRESSION: Gross cardiomegaly with diffuse bilateral interstitial pulmonary opacity and small layering pleural effusions, consistent with edema or atypical/viral infection. Electronically Signed   By: Jearld Lesch M.D.   On: 01/02/2023  15:22    Procedures .Critical Care  Performed by: Loetta Rough, MD Authorized by: Loetta Rough, MD   Critical care provider statement:    Critical care time (minutes):  45   Critical care was necessary to treat or prevent imminent or life-threatening deterioration of the following conditions:  Respiratory failure   Critical care was time spent personally by me on the following activities:  Development of treatment plan with patient or surrogate, discussions with consultants, evaluation of patient's response to treatment, examination of patient, ordering and review of laboratory studies, ordering and review of radiographic studies, ordering and performing treatments and interventions, pulse oximetry, re-evaluation of patient's condition, review of old charts, obtaining history from patient or surrogate and ventilator management   Care discussed with: admitting provider       Medications Ordered in ED Medications  ipratropium-albuterol (DUONEB) 0.5-2.5 (3) MG/3ML nebulizer solution 3 mL (3 mLs Nebulization Given 01/02/23 1615)  sodium chloride 0.9 % bolus 500 mL (0 mLs Intravenous Stopped 01/02/23 1814)  ipratropium-albuterol (DUONEB) 0.5-2.5 (3) MG/3ML nebulizer solution 3 mL (3 mLs Nebulization Given 01/02/23 1625)  furosemide (LASIX) injection 60 mg  (60 mg Intravenous Given 01/02/23 1643)  methylPREDNISolone sodium succinate (SOLU-MEDROL) 125 mg/2 mL injection 125 mg (125 mg Intravenous Given 01/02/23 1739)    ED Course/ Medical Decision Making/ A&P                          Medical Decision Making Amount and/or Complexity of Data Reviewed Labs: ordered. Decision-making details documented in ED Course. Radiology:  Decision-making details documented in ED Course.  Risk Prescription drug management. Decision regarding hospitalization.    This patient presents to the ED for concern of hypoxic resp failure, SOB; this involves an extensive number of treatment options, and is a complaint that carries with it a high risk of complications and morbidity.  I considered the following differential and admission for this acute, potentially life threatening condition.   MDM:    DDX for dyspnea includes but is not limited to:  Patient with likely acute on chronic hypoxemic respiratory failure.  Patient does appear somnolent in the room, consider hypercarbia and will get a VBG.  Patient with quiet chest, consider a COPD exacerbation will give DuoNebs and solumedrol to try to open up her airways. Will place patient on BiPAP as well.  Pt w/ h/o HF, BNP is not elevated, however CXR does show pulmonary edema. She dose have h/o PE as well, she denies CP currently, and her PE burden on her scan from last month was low, in s/o quiet chest/COPD and pulm edema believe there are other more likely causes for her presentation. EKG w/o signs of ischemia, just sinus tachycardia. No focal consolidation noted on CXR and pt not febrile, likely not PNA.    Clinical Course as of 01/02/23 2107  Sun Jan 02, 2023  1556 pCO2, Ven(!!): 75 Hypercarbic and hypoxic resp failure. Currently being placed on BiPAP w/ duonebs. [HN]  1556 pH, Ven: 7.36 No acidosis [HN]  1556 SARS Coronavirus 2 by RT PCR: NEGATIVE neg [HN]  1616 Troponin I (High Sensitivity)(!): 40 Elevated  from negative baseline [HN]  1616 B Natriuretic Peptide: 33.0 neg [HN]  1616 Creatinine(!): 1.44 +AKI [HN]  1616 DG Chest Port 1 View Gross cardiomegaly with diffuse bilateral interstitial pulmonary opacity and small layering pleural effusions, consistent with edema or atypical/viral infection.   [HN]  1616 Potassium: 4.4 K okay,  will give lasix for pulm edema [HN]  1649 Vt 250 cc at IPAP 14 still with quiet chest, increased IPAP to 18 [HN]  1746 Repeat pCO2 83 up from 75. Changed BiPAP settings, increased IPAP to 20, EPAP at 6. Increased RR from 12 to 20. Decreased iTime from 0.7 to 0.6. Will reassess. Patient still easily arousable to voice.  [HN]  1746 Troponin I (High Sensitivity)(!): 38 Flat but elevated. At this low level elevation and flat, I think likely related to demand in s/o her respiratory status rather than cardiac event, and patient denies chest pain. Can continue to trend.  [HN]  1919 pCO2, Ven(!): 67 Decreasing appropriately [HN]  1919 Will consult for admission to hospitalist [HN]    Clinical Course User Index [HN] Loetta Rough, MD    Labs: I Ordered, and personally interpreted labs.  The pertinent results include:  those lsited above  Imaging Studies ordered: I ordered imaging studies including CXR I independently visualized and interpreted imaging. I agree with the radiologist interpretation  Additional history obtained from chart review, family at bedside.    Cardiac Monitoring: The patient was maintained on a cardiac monitor.  I personally viewed and interpreted the cardiac monitored which showed an underlying rhythm of: sinus tachycardia  Reevaluation: After the interventions noted above, I reevaluated the patient and found that they have :improved  Social Determinants of Health: Lives with family  Disposition:  Admitted to hospitalist on BiPAP  Co morbidities that complicate the patient evaluation  Past Medical History:  Diagnosis Date    Anemia    Arthritis    Asthma    Hypertension    Knee pain    Obesity      Medicines Meds ordered this encounter  Medications   ipratropium-albuterol (DUONEB) 0.5-2.5 (3) MG/3ML nebulizer solution 3 mL   sodium chloride 0.9 % bolus 500 mL   ipratropium-albuterol (DUONEB) 0.5-2.5 (3) MG/3ML nebulizer solution 3 mL   furosemide (LASIX) injection 60 mg   methylPREDNISolone sodium succinate (SOLU-MEDROL) 125 mg/2 mL injection 125 mg    I have reviewed the patients home medicines and have made adjustments as needed  Problem List / ED Course: Problem List Items Addressed This Visit   None Visit Diagnoses     Acute on chronic respiratory failure with hypoxia and hypercapnia (HCC)    -  Primary   COPD exacerbation (HCC)       Relevant Medications   ipratropium-albuterol (DUONEB) 0.5-2.5 (3) MG/3ML nebulizer solution 3 mL (Completed)   ipratropium-albuterol (DUONEB) 0.5-2.5 (3) MG/3ML nebulizer solution 3 mL (Completed)   methylPREDNISolone sodium succinate (SOLU-MEDROL) 125 mg/2 mL injection 125 mg (Completed)   Acute pulmonary edema (HCC)       Elevated troponin                       This note was created using dictation software, which may contain spelling or grammatical errors.    Loetta Rough, MD 01/02/23 2108

## 2023-01-02 NOTE — ED Notes (Addendum)
RT at bedside.

## 2023-01-03 ENCOUNTER — Inpatient Hospital Stay (HOSPITAL_COMMUNITY): Payer: 59

## 2023-01-03 ENCOUNTER — Other Ambulatory Visit (HOSPITAL_COMMUNITY): Payer: Self-pay | Admitting: *Deleted

## 2023-01-03 DIAGNOSIS — I10 Essential (primary) hypertension: Secondary | ICD-10-CM

## 2023-01-03 DIAGNOSIS — E662 Morbid (severe) obesity with alveolar hypoventilation: Secondary | ICD-10-CM

## 2023-01-03 DIAGNOSIS — J9601 Acute respiratory failure with hypoxia: Secondary | ICD-10-CM | POA: Diagnosis not present

## 2023-01-03 DIAGNOSIS — R7989 Other specified abnormal findings of blood chemistry: Secondary | ICD-10-CM

## 2023-01-03 DIAGNOSIS — N179 Acute kidney failure, unspecified: Secondary | ICD-10-CM | POA: Diagnosis present

## 2023-01-03 DIAGNOSIS — J9602 Acute respiratory failure with hypercapnia: Secondary | ICD-10-CM

## 2023-01-03 DIAGNOSIS — Z86711 Personal history of pulmonary embolism: Secondary | ICD-10-CM

## 2023-01-03 DIAGNOSIS — I4891 Unspecified atrial fibrillation: Secondary | ICD-10-CM | POA: Clinically undetermined

## 2023-01-03 LAB — CBC WITH DIFFERENTIAL/PLATELET
Abs Immature Granulocytes: 0.1 10*3/uL — ABNORMAL HIGH (ref 0.00–0.07)
Basophils Absolute: 0 10*3/uL (ref 0.0–0.1)
Basophils Relative: 0 %
Eosinophils Absolute: 0 10*3/uL (ref 0.0–0.5)
Eosinophils Relative: 0 %
HCT: 36.3 % (ref 36.0–46.0)
Hemoglobin: 10.3 g/dL — ABNORMAL LOW (ref 12.0–15.0)
Immature Granulocytes: 1 %
Lymphocytes Relative: 5 %
Lymphs Abs: 0.5 10*3/uL — ABNORMAL LOW (ref 0.7–4.0)
MCH: 24.3 pg — ABNORMAL LOW (ref 26.0–34.0)
MCHC: 28.4 g/dL — ABNORMAL LOW (ref 30.0–36.0)
MCV: 85.8 fL (ref 80.0–100.0)
Monocytes Absolute: 0.3 10*3/uL (ref 0.1–1.0)
Monocytes Relative: 3 %
Neutro Abs: 8.7 10*3/uL — ABNORMAL HIGH (ref 1.7–7.7)
Neutrophils Relative %: 91 %
Platelets: 383 10*3/uL (ref 150–400)
RBC: 4.23 MIL/uL (ref 3.87–5.11)
RDW: 18.3 % — ABNORMAL HIGH (ref 11.5–15.5)
WBC: 9.7 10*3/uL (ref 4.0–10.5)
nRBC: 0 % (ref 0.0–0.2)

## 2023-01-03 LAB — COMPREHENSIVE METABOLIC PANEL
ALT: 12 U/L (ref 0–44)
AST: 10 U/L — ABNORMAL LOW (ref 15–41)
Albumin: 2.6 g/dL — ABNORMAL LOW (ref 3.5–5.0)
Alkaline Phosphatase: 51 U/L (ref 38–126)
Anion gap: 9 (ref 5–15)
BUN: 28 mg/dL — ABNORMAL HIGH (ref 8–23)
CO2: 33 mmol/L — ABNORMAL HIGH (ref 22–32)
Calcium: 8.2 mg/dL — ABNORMAL LOW (ref 8.9–10.3)
Chloride: 95 mmol/L — ABNORMAL LOW (ref 98–111)
Creatinine, Ser: 1.18 mg/dL — ABNORMAL HIGH (ref 0.44–1.00)
GFR, Estimated: 53 mL/min — ABNORMAL LOW (ref >60)
Glucose, Bld: 153 mg/dL — ABNORMAL HIGH (ref 70–99)
Potassium: 4.2 mmol/L (ref 3.5–5.1)
Sodium: 137 mmol/L (ref 135–145)
Total Bilirubin: 0.7 mg/dL (ref 0.3–1.2)
Total Protein: 8.5 g/dL — ABNORMAL HIGH (ref 6.5–8.1)

## 2023-01-03 LAB — BLOOD GAS, VENOUS
Acid-Base Excess: 13.7 mmol/L — ABNORMAL HIGH (ref 0.0–2.0)
Bicarbonate: 41.6 mmol/L — ABNORMAL HIGH (ref 20.0–28.0)
Drawn by: 7049
O2 Saturation: 83.2 %
Patient temperature: 36.7
pCO2, Ven: 71 mmHg (ref 44–60)
pH, Ven: 7.37 (ref 7.25–7.43)
pO2, Ven: 50 mmHg — ABNORMAL HIGH (ref 32–45)

## 2023-01-03 LAB — MAGNESIUM: Magnesium: 2.5 mg/dL — ABNORMAL HIGH (ref 1.7–2.4)

## 2023-01-03 LAB — MRSA NEXT GEN BY PCR, NASAL: MRSA by PCR Next Gen: NOT DETECTED

## 2023-01-03 LAB — PROCALCITONIN: Procalcitonin: <0.1 ng/mL

## 2023-01-03 MED ORDER — DILTIAZEM HCL-DEXTROSE 125-5 MG/125ML-% IV SOLN (PREMIX)
5.0000 mg/h | INTRAVENOUS | Status: DC
  Administered 2023-01-03: 5 mg/h via INTRAVENOUS
  Filled 2023-01-03 (×2): qty 125

## 2023-01-03 MED ORDER — DILTIAZEM LOAD VIA INFUSION
20.0000 mg | Freq: Once | INTRAVENOUS | Status: AC
  Administered 2023-01-03: 20 mg via INTRAVENOUS
  Filled 2023-01-03: qty 20

## 2023-01-03 MED ORDER — DILTIAZEM HCL 25 MG/5ML IV SOLN
5.0000 mg | Freq: Once | INTRAVENOUS | Status: AC
  Administered 2023-01-03: 5 mg via INTRAVENOUS
  Filled 2023-01-03: qty 5

## 2023-01-03 NOTE — Hospital Course (Addendum)
62yo female with h/o anemia, OHS on 2L home O2, HTN, and PE on Eliquis who presented on 8/19 with SOB.  O2 sats 80% with EMS despite home O2, increased to 4L but still in 70s in ER so increased to 5L -> NRB -> BIPAP.  Multifactorial acute on chronic respiratory failure associated with CHF + restrictive/obstructive lung disease.  COVID negative.  CXR with ?edema.  Given Lasix, Solumedrol.  Elevated troponin, likely demand, Echo pending.  She is in afib on 8/19 and this appears to be a new diagnosis.

## 2023-01-03 NOTE — Progress Notes (Signed)
Progress Note   Patient: Emma Morris WUJ:[id number] DOB: 08-02-1960 DOA: 01/02/2023     1 DOS: the patient was seen and examined on 01/03/2023   Brief hospital course: 62yo female with h/o anemia, OHS on 2L home O2, HTN, and PE on Eliquis who presented on 8/19 with SOB.  O2 sats 80% with EMS despite home O2, increased to 4L but still in 70s in ER so increased to 5L -> NRB -> BIPAP.  Multifactorial acute on chronic respiratory failure associated with CHF + restrictive/obstructive lung disease.  COVID negative.  CXR with ?edema.  Given Lasix, Solumedrol.  Elevated troponin, likely demand, Echo pending.  She is in afib on 8/19 and this appears to be a new diagnosis.  Assessment and Plan: * Acute respiratory failure with hypoxia and hypercapnia (HCC) Patient reports that she is on 2L chronic [place] O2 Worsening SOB over the last 2 days Initial blood gas shows pCO2 of 75, then 83, then after starting BiPAP improving down to 67, 71 Found to be in afib - possibly as the cause of respiratory distress with CHF vs. Caused by respiratory distress - likely multifactorial from concomitant OHS Continue BiPAP prn COVID-negative Chest x-ray shows cardiomegaly, with possible pulmonary edema Continue diuretics/Fluid restriction Continue breathing treatments Solu-Medrol given in the ED but not frankly wheezing so will stop Procalcitonin negative, no current indication for abx Echo ordered Admit to SDU  New onset atrial fibrillation [facility]) Patient reports h/o afib but this is not evident on brief chart review Undetermined rhythm at times but appeared to have clear afib on telemetry in SDU this AM Poor rate control without improvement with diltiazem push Will start cardizem drip (recent echo with preserved EF) Already on Eliquis, will continue Cardiology consulted  History of pulmonary embolism Continue Eliquis  Elevated troponin Likely secondary to demand ischemia in the setting of acute on chronic  respiratory failure with hypoxia and with acute hypercapnia in conjunction with new-onset afib Downtrending from 40>> 38 Cardiology consulted  AKI (acute kidney injury) (HCC) Creatinine increased from 1.04>> 1.44, improved to 1.18 this AM Likely cardiorenal syndrome with signs of CHF Continue Lasix Continue to follow daily BMP  Obesity hypoventilation syndrome (HCC) Continue BIPAP prn  HTN (hypertension) Stop amlodipine in the setting of Cardizem drip, new-onset afib     Consultants: Cardiology  Procedures: Echo, limited (ordered)  Antibiotics: None  30 Day Unplanned Readmission Risk Score    Flowsheet Row ED to Hosp-Admission (Current) from 01/02/2023 in [place] [facility] UN[facility]  30 Day Unplanned Readmission Risk Score (%) 62.62 Filed at 01/03/2023 0801       This score is the patient's risk of an unplanned readmission within 30 days of being discharged (0 -100%). The score is based on dignosis, age, lab data, medications, orders, and past utilization.   Low:  0-14.9   Medium: 15-21.9   High: 22-29.9   Extreme: 30 and above           Subjective: She reports about 2 days of SOB PTA.  No worsening LE edema.  ?orthopnea.  No sick contacts.  She is currently feeling better, on BIPAP.  Noted to be in afib with HR 120s this AM; this appears to be a new diagnosis.  Echo is pending but currently on hold due to mild RVR.   Objective: Vitals:   01/03/23 1500 01/03/23 1619  BP: 102/74   Pulse: (!) 104   Resp:    Temp:  98.1 F (36.7 C)  SpO2: 91%     Intake/Output Summary (Last 24 hours) at 01/03/2023 1638 Last data filed at 01/03/2023 1258 Gross per 24 hour  Intake 0 ml  Output 675 ml  Net -675 ml   Filed Weights   01/02/23 2121 01/03/23 0711  Weight: 127 kg 126.5 kg    Exam:  General:  Appears calm and comfortable and is in NAD, on BIPAP, HR 120s Eyes:   EOMI, normal lids, iris ENT:  grossly normal hearing, lips & tongue; BIPAP in place Neck:  no  LAD, masses or thyromegaly Cardiovascular:  Irregularly irregular with mild tachycardia, no m/r/g. No LE edema.  Respiratory:   CTA bilaterally with no wheezes/rales/rhonchi.  Normal respiratory effort. Abdomen:  soft, NT, ND Skin:  no rash or induration seen on limited exam Musculoskeletal:  grossly normal tone BUE/BLE, good ROM, no bony abnormality Psychiatric:  blunted mood and affect, speech fluent and appropriate, AOx3 Neurologic:  CN 2-12 grossly intact, moves all extremities in coordinated fashion   Data Reviewed: I have reviewed the patient's lab results since admission.  Pertinent labs for today include:  ABG: 7.37/71/41.6 CO2 33 Glucose 153 BUN 28/Creatinine 1.18/GFR 543 Albumin 2.6 Procalcitonin <0.10 WBC 9.7 Hgb 10.3     Family Communication: Lives with son - will call  Disposition: Status is: Inpatient Remains inpatient appropriate because: BIPAP, critically ill  Planned Discharge Destination:  TBD    Total critical care time: 55 minutes Critical care time was exclusive of separately billable procedures and treating other patients. Critical care was necessary to treat or prevent imminent or life-threatening deterioration. Critical care was time spent personally by me on the following activities: development of treatment plan with patient and/or surrogate as well as nursing, discussions with consultants, evaluation of patient's response to treatment, examination of patient, obtaining history from patient or surrogate, ordering and performing treatments and interventions, ordering and review of laboratory studies, ordering and review of radiographic studies, pulse oximetry and re-evaluation of patient's condition.   Author: Jonah Blue, MD 01/03/2023 4:38 PM  For on call review [email or web address].

## 2023-01-03 NOTE — Assessment & Plan Note (Addendum)
On BIPAP early on but has transitioned back to home O2

## 2023-01-03 NOTE — Assessment & Plan Note (Signed)
Patient reports h/o afib but this is not evident on brief chart review Undetermined rhythm at times but appeared to have clear afib on telemetry in SDU this AM Poor rate control without improvement with diltiazem push Will start cardizem drip (recent echo with preserved EF) Already on Eliquis, will continue Cardiology consulted

## 2023-01-03 NOTE — Assessment & Plan Note (Addendum)
Patient reports that she is on 2L chronic [place] O2 Worsening SOB over the last 2 days Initial blood gas shows pCO2 of 75, then 83, then after starting BiPAP improving down to 67, 71 Found to be in afib - possibly as the cause of respiratory distress with CHF vs. Caused by respiratory distress - likely multifactorial from concomitant OHS Has transitioned off BIPAP COVID-negative Chest x-ray shows cardiomegaly, with possible pulmonary edema Continue diuretics/Fluid restriction Continue breathing treatments Solu-Medrol given in the ED but not frankly wheezing so will stop Procalcitonin negative, no current indication for abx Echo ordered Admit to SDU

## 2023-01-03 NOTE — Assessment & Plan Note (Addendum)
Stop amlodipine in the setting of Cardizem initiation, new-onset afib

## 2023-01-03 NOTE — Assessment & Plan Note (Addendum)
Creatinine increased from 1.04>> 1.46 Likely cardiorenal syndrome with signs of CHF Continue Lasix Continue to follow daily BMP

## 2023-01-03 NOTE — Progress Notes (Signed)
*  PRELIMINARY RESULTS* Echocardiogram 2D Echocardiogram has been not been performed. Heart rate 117-133 bpm. Will check back.  Stacey Drain 01/03/2023, 9:01 AM

## 2023-01-03 NOTE — Progress Notes (Addendum)
   01/03/23 1332  TOC Brief Assessment  Insurance and Status Reviewed  Patient has primary care physician Yes  Home environment has been reviewed Home with kids  Prior level of function: independant  Prior/Current Home Services Current home services (DME, home oxygen)  Social Determinants of Health Reivew SDOH reviewed no interventions necessary  Readmission risk has been reviewed Yes  Transition of care needs no transition of care needs at this time   Patient readmitted, on BIPAP, Cardiology consulted.  CHF education added to AVS. TOC following.

## 2023-01-03 NOTE — H&P (Signed)
History and Physical    Patient: Emma Morris WUJ:[id number] DOB: 12-Aug-1960 DOA: 01/02/2023 DOS: the patient was seen and examined on 01/03/2023 PCP: Alvina Filbert, MD  Patient coming from: Home  Chief Complaint:  Chief Complaint  Patient presents with   Respiratory Distress   HPI: Emma Morris is a 62 y.o. female with medical history significant of anemia, obesity hypoventilation syndrome, hypertension, history of PE on Eliquis, and more presents the ED with a chief complaint of shortness of breath.  Patient came in via EMS from home with shortness of breath.  She wears 2 L nasal cannula at home but was satting 80% on that.  She was placed on 4 L nasal cannula and her O2 sats improved to 90%.  When she first arrived to the ED her O2 sats were in the 70s on 5 L nasal cannula.  She was placed on rebreather before ultimately being started on BiPAP.  Unfortunately, patient is not able to provide any history at this time due to acute illness.  She is somnolent and on the BiPAP.  Patient has no ACP documents so she is full code by default. Review of Systems: unable to review all systems due to the inability of the patient to answer questions. Past Medical History:  Diagnosis Date   Anemia    Arthritis    Asthma    Hypertension    Knee pain    Obesity    Past Surgical History:  Procedure Laterality Date   CESAREAN SECTION     COLONOSCOPY WITH PROPOFOL N/A 11/09/2021   Procedure: COLONOSCOPY WITH PROPOFOL;  Surgeon: Lanelle Bal, DO;  Location: [facility];  Service: Endoscopy;  Laterality: N/A;  11:00am   DILITATION & CURRETTAGE/HYSTROSCOPY WITH THERMACHOICE ABLATION N/A 05/04/2013   Procedure: DILATATION & CURETTAGE/HYSTEROSCOPY WITH THERMACHOICE ABLATION;  Surgeon: Lazaro Arms, MD;  Location: [facility];  Service: Gynecology;  Laterality: N/A;  temperature:87 degrees; total therapy time:14 minutes and 5 seconds   ESOPHAGOGASTRODUODENOSCOPY (EGD) WITH PROPOFOL N/A  11/09/2021   Procedure: ESOPHAGOGASTRODUODENOSCOPY (EGD) WITH PROPOFOL;  Surgeon: Lanelle Bal, DO;  Location: [facility];  Service: Endoscopy;  Laterality: N/A;   TONSILLECTOMY     Social History:  reports that she has never smoked. She has never used smokeless tobacco. She reports that she does not drink alcohol and does not use drugs.  No Known Allergies  Family History  Problem Relation Age of Onset   Cancer Mother    Kidney disease Father    Diabetes Father    Colon cancer Neg Hx     Prior to Admission medications   Medication Sig Start Date End Date Taking? Authorizing Provider  acetaminophen (TYLENOL) 500 MG tablet Take 1,000 mg by mouth every 8 (eight) hours as needed for moderate pain.    [provider]  albuterol (PROVENTIL HFA;VENTOLIN HFA) 108 (90 Base) MCG/ACT inhaler Inhale 2 puffs into the lungs every 6 (six) hours as needed for wheezing or shortness of breath.    [provider]  amLODipine (NORVASC) 10 MG tablet Take 10 mg by mouth daily. 05/17/19   [provider]  apixaban (ELIQUIS) 5 MG TABS tablet Take 2 tablets (10 mg total) by mouth 2 (two) times daily for 7 days, THEN 1 tablet (5 mg total) 2 (two) times daily. 12/01/22 01/07/23  Kendell Bane, MD  cholecalciferol (VITAMIN D3) 25 MCG (1000 UNIT) tablet Take 1,000 Units by mouth daily.    [provider]  dextromethorphan (DELSYM) 30 MG/5ML liquid Take 5 mLs (30 mg total) by mouth 2 (two) times daily as needed for cough. 07/28/22   Rolly Salter, MD  fluticasone Aleda Grana) 50 MCG/ACT nasal spray Place into both nostrils. 01/27/21   [provider]  furosemide (LASIX) 40 MG tablet Take 1 tablet (40 mg total) by mouth daily. 12/01/22 12/01/23  Kendell Bane, MD  methylPREDNISolone (MEDROL DOSEPAK) 4 MG TBPK tablet Medrol Dosepak take as instructed 12/01/22   Kendell Bane, MD  potassium chloride SA (KLOR-CON M) 20 MEQ tablet Take 1 tablet twice a day for 2  weeks, then 1 tablet every day 12/27/22   Dione Booze, MD    Physical Exam: Vitals:   01/02/23 2133 01/02/23 2200 01/02/23 2305 01/03/23 0208  BP:  117/80 107/80   Pulse:  (!) 106 (!) 108   Resp:  (!) 24 (!) 28   Temp:      TempSrc:      SpO2: 94% 96%  97%  Weight:      Height:       1.  General: Patient lying supine in bed,  no acute distress   2. Psychiatric: Somnolent, nonverbal at this time, following commands to squeeze hand   3. Neurologic: Nonverbal at this time, only minimally following commands so full neuroexam cannot be assessed   4. HEENMT:  Head is atraumatic, normocephalic, pupils reactive to light, neck is supple, trachea is midline, dry mucous membranes   5. Respiratory : Crackles greater on right than left, diminished breath sounds in the lower lung fields, maintaining oxygen sats on BiPAP   6. Cardiovascular : Heart rate normal, rhythm is regular, no murmurs, rubs or gallops, peripheral edema present, peripheral pulses palpated   7. Gastrointestinal:  Abdomen is soft, nondistended, nontender to palpation bowel sounds active, no masses or organomegaly palpated   8. Skin:  Skin is warm, dry and intact without rashes, acute lesions, or ulcers on limited exam   9.Musculoskeletal:  No acute deformities or trauma, no asymmetry in tone, peripheral edema present,, peripheral pulses palpated, no tenderness to palpation in the extremities  Data Reviewed: In the ED Temp 98, heart rate tachycardic, respiratory rate tachypneic up to 37, blood pressure soft 101/78-216/87, satting 93% on 80% FiO2 pCO2 75 then 83 then 67 No leukocytosis with white blood cell count of 10.9, hemoglobin 10.9 Chemistry reveals an AKI Troponin elevated at 40, then 38 Chest x-ray shows cardiomegaly, diffuse bilateral pulmonary opacities that are consistent with edema COVID-negative EKG shows sinus tach, heart rate 123, QTc 412 Patient was given diuretic, DuoNeb x 2, Solu-Medrol, 500 mL  bolus Admission requested for respiratory failure with hypoxia and hypercapnia  Assessment and Plan: * Acute respiratory failure with hypoxia and hypercapnia (HCC) - Initial blood gas shows pCO2 of 75, then 83, then after starting BiPAP improving down to 67 - Multifactorial with CHF, restrictive and obstructive lung disease also likely contributing - Continue BiPAP - Trend VBG in the a.m. - Patient wears 2 L nasal cannula at home, but was satting at 80% on that - Somnolent - COVID-negative - Chest x-ray shows cardiomegaly, with possible pulmonary edema - Patient has peripheral edema - Continue diuretics - Fluid restrictions - Continue breathing treatments - Solu-Medrol given in the ED, continue Solu-Medrol - Procalcitonin pending to assess for possible superimposed bacterial infection - Echo in the a.m. - Continue to monitor  History of pulmonary embolism - Continue Eliquis  Elevated troponin - Likely secondary to  demand ischemia in the setting of acute on chronic respiratory failure with hypoxia and with acute hypercapnia - Downtrending from 40>> 38 - EKG shows sinus tachycardia with a heart rate of 123, QTc of 412 but no acute ischemic changes - Continue to monitor on telemetry  AKI (acute kidney injury) (HCC) - Creatinine increased from 1.04>> 1.44 - Likely cardiorenal syndrome with signs of CHF - Continue Lasix - Trend in the a.m.  Obesity hypoventilation syndrome (HCC) - Continue BiPAP  HTN (hypertension) - Continue Norvasc      Advance Care Planning:   Code Status: Full Code  Consults: None at this time  Family Communication: No family at bedside  Severity of Illness: The appropriate patient status for this patient is INPATIENT. Inpatient status is judged to be reasonable and necessary in order to provide the required intensity of service to ensure the patient's safety. The patient's presenting symptoms, physical exam findings, and initial radiographic and  laboratory data in the context of their chronic comorbidities is felt to place them at high risk for further clinical deterioration. Furthermore, it is not anticipated that the patient will be medically stable for discharge from the hospital within 2 midnights of admission.   * I certify that at the point of admission it is my clinical judgment that the patient will require inpatient hospital care spanning beyond 2 midnights from the point of admission due to high intensity of service, high risk for further deterioration and high frequency of surveillance required.*  Author: Lilyan Gilford, DO 01/03/2023 3:30 AM  For on call review [email or web address].

## 2023-01-03 NOTE — Progress Notes (Signed)
*  PRELIMINARY RESULTS* Echocardiogram 2D Echocardiogram has not been performed at this time due to elevated heart rate.  Stacey Drain 01/03/2023, 12:19 PM

## 2023-01-03 NOTE — Assessment & Plan Note (Addendum)
Likely secondary to demand ischemia in the setting of acute on chronic respiratory failure with hypoxia and with acute hypercapnia in conjunction with new-onset afib Downtrending from 40>> 38 Cardiology consulted

## 2023-01-03 NOTE — Assessment & Plan Note (Addendum)
-   Continue Eliquis 

## 2023-01-04 ENCOUNTER — Encounter (HOSPITAL_COMMUNITY): Payer: Self-pay | Admitting: Family Medicine

## 2023-01-04 ENCOUNTER — Other Ambulatory Visit (HOSPITAL_COMMUNITY): Payer: Self-pay | Admitting: *Deleted

## 2023-01-04 ENCOUNTER — Inpatient Hospital Stay (HOSPITAL_COMMUNITY): Payer: 59

## 2023-01-04 DIAGNOSIS — I4891 Unspecified atrial fibrillation: Secondary | ICD-10-CM

## 2023-01-04 DIAGNOSIS — J9602 Acute respiratory failure with hypercapnia: Secondary | ICD-10-CM | POA: Diagnosis not present

## 2023-01-04 DIAGNOSIS — J9601 Acute respiratory failure with hypoxia: Secondary | ICD-10-CM | POA: Diagnosis not present

## 2023-01-04 DIAGNOSIS — I2489 Other forms of acute ischemic heart disease: Secondary | ICD-10-CM | POA: Diagnosis not present

## 2023-01-04 DIAGNOSIS — I5033 Acute on chronic diastolic (congestive) heart failure: Secondary | ICD-10-CM | POA: Diagnosis present

## 2023-01-04 DIAGNOSIS — I4819 Other persistent atrial fibrillation: Secondary | ICD-10-CM

## 2023-01-04 LAB — BASIC METABOLIC PANEL
Anion gap: 8 (ref 5–15)
BUN: 39 mg/dL — ABNORMAL HIGH (ref 8–23)
CO2: 36 mmol/L — ABNORMAL HIGH (ref 22–32)
Calcium: 8.7 mg/dL — ABNORMAL LOW (ref 8.9–10.3)
Chloride: 92 mmol/L — ABNORMAL LOW (ref 98–111)
Creatinine, Ser: 1.46 mg/dL — ABNORMAL HIGH (ref 0.44–1.00)
GFR, Estimated: 41 mL/min — ABNORMAL LOW (ref >60)
Glucose, Bld: 118 mg/dL — ABNORMAL HIGH (ref 70–99)
Potassium: 4.1 mmol/L (ref 3.5–5.1)
Sodium: 136 mmol/L (ref 135–145)

## 2023-01-04 LAB — ECHOCARDIOGRAM LIMITED
Area-P 1/2: 4.68 cm2
Height: 62 in
S' Lateral: 2 cm
Weight: 4462.11 [oz_av]

## 2023-01-04 LAB — CBC
HCT: 36.3 % (ref 36.0–46.0)
Hemoglobin: 10.4 g/dL — ABNORMAL LOW (ref 12.0–15.0)
MCH: 24.2 pg — ABNORMAL LOW (ref 26.0–34.0)
MCHC: 28.7 g/dL — ABNORMAL LOW (ref 30.0–36.0)
MCV: 84.6 fL (ref 80.0–100.0)
Platelets: 389 10*3/uL (ref 150–400)
RBC: 4.29 MIL/uL (ref 3.87–5.11)
RDW: 18 % — ABNORMAL HIGH (ref 11.5–15.5)
WBC: 14.3 10*3/uL — ABNORMAL HIGH (ref 4.0–10.5)
nRBC: 0 % (ref 0.0–0.2)

## 2023-01-04 MED ORDER — DILTIAZEM HCL 30 MG PO TABS
30.0000 mg | ORAL_TABLET | Freq: Four times a day (QID) | ORAL | Status: DC
  Administered 2023-01-04 – 2023-01-06 (×8): 30 mg via ORAL
  Filled 2023-01-04 (×8): qty 1

## 2023-01-04 NOTE — Assessment & Plan Note (Signed)
-  Patient with known h/o chronic diastolic CHF presenting with worsening SOB and hypoxia -CXR consistent with mild pulmonary edema -New onset afib likely contributing -Will request repeat limited echocardiogram to ensure no significant change in EF -Was given Lasix 60 mg x 1 in ER and will repeat with 40 mg IV BID -Mildly elevated HS troponin is likely related to demand ischemia; doubt ACS based on symptoms

## 2023-01-04 NOTE — Plan of Care (Signed)
  Problem: Education: Goal: Knowledge of General Education information will improve Description: Including pain rating scale, medication(s)/side effects and non-pharmacologic comfort measures Outcome: Progressing   Problem: Health Behavior/Discharge Planning: Goal: Ability to manage health-related needs will improve Outcome: Progressing   Problem: Clinical Measurements: Goal: Ability to maintain clinical measurements within normal limits will improve Outcome: Progressing Goal: Will remain free from infection Outcome: Progressing Goal: Diagnostic test results will improve Outcome: Progressing Goal: Respiratory complications will improve Outcome: Progressing Goal: Cardiovascular complication will be avoided Outcome: Progressing   Problem: Activity: Goal: Risk for activity intolerance will decrease Outcome: Progressing   Problem: Nutrition: Goal: Adequate nutrition will be maintained Outcome: Progressing   Problem: Coping: Goal: Level of anxiety will decrease Outcome: Progressing   Problem: Elimination: Goal: Will not experience complications related to bowel motility Outcome: Progressing Goal: Will not experience complications related to urinary retention Outcome: Progressing   Problem: Pain Managment: Goal: General experience of comfort will improve Outcome: Progressing   Problem: Safety: Goal: Ability to remain free from injury will improve Outcome: Progressing   Problem: Skin Integrity: Goal: Risk for impaired skin integrity will decrease Outcome: Progressing   Problem: Education: Goal: Ability to demonstrate management of disease process will improve Outcome: Progressing Goal: Ability to verbalize understanding of medication therapies will improve Outcome: Progressing Goal: Individualized Educational Video(s) Outcome: Progressing   Problem: Activity: Goal: Capacity to carry out activities will improve Outcome: Progressing   Problem: Cardiac: Goal:  Ability to achieve and maintain adequate cardiopulmonary perfusion will improve Outcome: Progressing   Problem: Education: Goal: Knowledge of disease or condition will improve Outcome: Progressing Goal: Knowledge of the prescribed therapeutic regimen will improve Outcome: Progressing Goal: Individualized Educational Video(s) Outcome: Progressing   Problem: Activity: Goal: Ability to tolerate increased activity will improve Outcome: Progressing Goal: Will verbalize the importance of balancing activity with adequate rest periods Outcome: Progressing   Problem: Respiratory: Goal: Ability to maintain a clear airway will improve Outcome: Progressing Goal: Levels of oxygenation will improve Outcome: Progressing Goal: Ability to maintain adequate ventilation will improve Outcome: Progressing   

## 2023-01-04 NOTE — Consult Note (Signed)
Cardiology Consultation:   Patient ID: Emma Morris; [id number]; 1960/09/11   Admit date: 01/02/2023 Date of Consult: 01/04/2023  Primary Care Provider: Alvina Filbert, MD Primary Cardiologist: New to [facility]  History of Present Illness:   [name] is a 62 y.o. female with past medical history outlined below currently hospitalized with acute on chronic hypoxic/hypercarbic respiratory failure.  She tells me that she had been experiencing intermittent shortness of breath, worse over the last few days, and did find that her oxygen tubing was not completely connected perhaps exacerbating the problem.  She was initially stabilized with BiPAP, also given steroids, tested negative for COVID-19, and had minor high-sensitivity troponin I elevation consistent with demand ischemia.  Presenting ECG showed sinus tachycardia, but she has had subsequent atrial fibrillation by telemetry and was placed on intravenous diltiazem by primary team.  Of note, she is already anticoagulated with Eliquis given recent diagnosis of pulmonary embolus in June.  She does not report any recent cough, no fevers or chills.  Chest x-ray shows cardiomegaly with interstitial edema and small pleural effusions.  Limited follow-up echocardiogram is pending.  ROS:  Pertinent review in history of present illness.  No palpitations or chest pain, denies any prior diagnosis of cardiac arrhythmia.  No recent leg swelling.  Past Medical History:  Diagnosis Date   Anemia    Arthritis    Asthma    Hypertension    Knee pain    Obesity hypoventilation syndrome [facility])    Pulmonary embolus [facility])    Diagnosed July 2024   Respiratory failure with hypoxia [facility])     Past Surgical History:  Procedure Laterality Date   CESAREAN SECTION     COLONOSCOPY WITH PROPOFOL N/A 11/09/2021   Procedure: COLONOSCOPY WITH PROPOFOL;  Surgeon: Lanelle Bal, DO;  Location: [facility];  Service: Endoscopy;  Laterality: N/A;   11:00am   DILITATION & CURRETTAGE/HYSTROSCOPY WITH THERMACHOICE ABLATION N/A 05/04/2013   Procedure: DILATATION & CURETTAGE/HYSTEROSCOPY WITH THERMACHOICE ABLATION;  Surgeon: Lazaro Arms, MD;  Location: [facility] ORS;  Service: Gynecology;  Laterality: N/A;  temperature:87 degrees; total therapy time:14 minutes and 5 seconds   ESOPHAGOGASTRODUODENOSCOPY (EGD) WITH PROPOFOL N/A 11/09/2021   Procedure: ESOPHAGOGASTRODUODENOSCOPY (EGD) WITH PROPOFOL;  Surgeon: Lanelle Bal, DO;  Location: [facility];  Service: Endoscopy;  Laterality: N/A;   TONSILLECTOMY       Inpatient Medications: Scheduled Meds:  apixaban  5 mg Oral BID   Chlorhexidine Gluconate Cloth  6 each Topical Q0600   furosemide  40 mg Intravenous BID   ipratropium-albuterol  3 mL Nebulization Q6H   Continuous Infusions:  diltiazem (CARDIZEM) infusion 7.5 mg/hr (01/04/23 0846)   PRN Meds: acetaminophen **OR** acetaminophen, albuterol, morphine injection, ondansetron **OR** ondansetron (ZOFRAN) IV, oxyCODONE  Allergies:   No Known Allergies  Social History:   Social History   Tobacco Use   Smoking status: Never   Smokeless tobacco: Never  Substance Use Topics   Alcohol use: No    Family History:   The patient's family history includes Cancer in her mother; Diabetes in her father; Kidney disease in her father. There is no history of Colon cancer.  Physical Exam/Data:   Vitals:   01/04/23 0700 01/04/23 0728 01/04/23 0800 01/04/23 0825  BP: 112/70  (!) 92/57   Pulse: 87  91   Resp:   (!) 33   Temp:  97.6 F (36.4 C)    TempSrc:  Axillary    SpO2: 97%  91% 93%  Weight:   126.5 kg   Height:   5\' 2"  (1.575 m)     Intake/Output Summary (Last 24 hours) at 01/04/2023 0915 Last data filed at 01/04/2023 0846 Gross per 24 hour  Intake 246.41 ml  Output 700 ml  Net -453.59 ml   Filed Weights   01/03/23 0711 01/04/23 0400 01/04/23 0800  Weight: 126.5 kg 126.5 kg 126.5 kg   Body mass index is 51.01 kg/m.   Gen:  Patient appears comfortable at rest.  Wearing oxygen via nasal cannula. HEENT: Conjunctiva and lids normal. Neck: Supple, no elevated JVP or carotid bruits. Lungs: No crackles or wheezing anteriorly. Cardiac: Irregularly irregular, no significant murmur or gallop. Abdomen: Soft, nontender, bowel sounds present. Extremities: No pitting edema, distal pulses 2+. Skin: Warm and dry. Musculoskeletal: No kyphosis. Neuropsychiatric: Alert and oriented x3, affect grossly appropriate.  EKG:  An ECG dated 01/02/2023 was personally reviewed today and demonstrated:  Sinus tachycardia with low voltage in the limb leads.  Telemetry:  I personally reviewed telemetry which shows atrial fibrillation.  Relevant CV Studies:  Echocardiogram 11/28/2022:  1. Left ventricular ejection fraction, by estimation, is 65 to 70%. Left  ventricular ejection fraction by 2D MOD biplane is 68.8 %. The left  ventricle has normal function. The left ventricle has no regional wall  motion abnormalities. There is mild left  ventricular hypertrophy. Left ventricular diastolic parameters are  consistent with Grade I diastolic dysfunction (impaired relaxation).   2. Right ventricular systolic function is normal. The right ventricular  size is normal. There is normal pulmonary artery systolic pressure. The  estimated right ventricular systolic pressure is 29.9 mmHg.   3. The mitral valve is grossly normal. Trivial mitral valve  regurgitation.   4. The aortic valve is tricuspid. Aortic valve regurgitation is not  visualized.   5. Aortic dilatation noted. There is borderline dilatation of the  ascending aorta, measuring 38 mm.   6. The inferior vena cava is dilated in size with <50% respiratory  variability, suggesting right atrial pressure of 15 mmHg.   Laboratory Data:  Chemistry Recent Labs  Lab 01/02/23 1523 01/03/23 0418 01/04/23 0457  NA 135 137 136  K 4.4 4.2 4.1  CL 93* 95* 92*  CO2 35* 33* 36*  GLUCOSE  124* 153* 118*  BUN 25* 28* 39*  CREATININE 1.44* 1.18* 1.46*  CALCIUM 8.3* 8.2* 8.7*  GFRNONAA 41* 53* 41*  ANIONGAP 7 9 8     Recent Labs  Lab 01/03/23 0418  PROT 8.5*  ALBUMIN 2.6*  AST 10*  ALT 12  ALKPHOS 51  BILITOT 0.7   Hematology Recent Labs  Lab 01/02/23 1523 01/03/23 0418 01/04/23 0457  WBC 10.9* 9.7 14.3*  RBC 4.51 4.23 4.29  HGB 10.9* 10.3* 10.4*  HCT 38.7 36.3 36.3  MCV 85.8 85.8 84.6  MCH 24.2* 24.3* 24.2*  MCHC 28.2* 28.4* 28.7*  RDW 18.1* 18.3* 18.0*  PLT 396 383 389   Cardiac Enzymes Recent Labs  Lab 12/27/22 0027 12/27/22 0236 01/02/23 1523 01/02/23 1656  TROPONINIHS 5 5 40* 38*   BNP Recent Labs  Lab 01/02/23 1523  BNP 33.0     Radiology/Studies:  DG Chest Port 1 View  Result Date: 01/02/2023 CLINICAL DATA:  Shortness of breath EXAM: PORTABLE CHEST 1 VIEW COMPARISON:  12/26/2022 FINDINGS: Gross cardiomegaly. Diffuse bilateral interstitial pulmonary opacity. Small layering pleural effusions. The visualized skeletal structures are unremarkable. IMPRESSION: Gross cardiomegaly with diffuse bilateral interstitial pulmonary opacity and small layering  pleural effusions, consistent with edema or atypical/viral infection. Electronically Signed   By: Jearld Lesch M.D.   On: 01/02/2023 15:22    Assessment and Plan:   1.  Persistent atrial fibrillation, newly diagnosed and potentially exacerbated by presentation with acute on chronic hypoxic/hypercarbic respiratory failure.  Also had recent diagnosis of pulmonary embolus in July.  She is already on Eliquis and has been placed on intravenous diltiazem by primary team with reasonable heart rate control.  Blood pressure low normal.  CHA2DS2-VASc score is 2.  2.  Obesity hypoventilation syndrome with chronic hypoxic respiratory failure.  She is on supplemental oxygen at home via nasal cannula.  3.  Pulmonary embolus diagnosed in July, on Eliquis.  Echocardiogram at that time revealed normal LV and RV  contraction as well as estimated PASP.  4.  Acute renal insufficiency, creatinine 1.46.  5.  Essential hypertension, currently off Norvasc.  Follow-up limited echocardiogram to ensure no change in LV or RV function.  Transition from intravenous diltiazem to oral diltiazem 30 mg p.o. every 6 hours.  Otherwise continue Eliquis.  No acute need for cardioversion at this time.  For questions or updates, please contact [facility] Please consult [email or web address] for contact info under   Signed, Nona Dell, MD  01/04/2023 9:15 AM

## 2023-01-04 NOTE — Progress Notes (Signed)
Patient is currently on a 6L [place] with O2 sat of 90

## 2023-01-04 NOTE — Progress Notes (Signed)
*  PRELIMINARY RESULTS* Echocardiogram Limited 2-D Echocardiogram  has been performed.  Stacey Drain 01/04/2023, 3:51 PM

## 2023-01-04 NOTE — Progress Notes (Signed)
Progress Note   Patient: Emma Morris VHQ:[id number] DOB: 08-12-1960 DOA: 01/02/2023     2 DOS: the patient was seen and examined on 01/04/2023   Brief hospital course: 62yo female with h/o anemia, OHS on 2L home O2, HTN, and PE on Eliquis who presented on 8/19 with SOB.  O2 sats 80% with EMS despite home O2, increased to 4L but still in 70s in ER so increased to 5L -> NRB -> BIPAP.  Multifactorial acute on chronic respiratory failure associated with CHF + restrictive/obstructive lung disease.  COVID negative.  CXR with ?edema.  Given Lasix, Solumedrol.  Elevated troponin, likely demand, Echo pending.  She is in afib on 8/19 and this appears to be a new diagnosis.  Assessment and Plan: * Acute respiratory failure with hypoxia and hypercapnia (HCC) Patient reports that she is on 2L chronic [place] O2 Worsening SOB over the last 2 days Initial blood gas shows pCO2 of 75, then 83, then after starting BiPAP improving down to 67, 71 Found to be in afib - possibly as the cause of respiratory distress with CHF vs. Caused by respiratory distress - likely multifactorial from concomitant OHS Has transitioned off BIPAP COVID-negative Chest x-ray shows cardiomegaly, with possible pulmonary edema Continue diuretics/Fluid restriction Continue breathing treatments Solu-Medrol given in the ED but not frankly wheezing so will stop Procalcitonin negative, no current indication for abx Echo ordered Admit to SDU  Acute on chronic diastolic CHF (congestive heart failure) (HCC) -Patient with known h/o chronic diastolic CHF presenting with worsening SOB and hypoxia -CXR consistent with mild pulmonary edema -New onset afib likely contributing -Will request repeat limited echocardiogram to ensure no significant change in EF -Was given Lasix 60 mg x 1 in ER and will repeat with 40 mg IV BID -Mildly elevated HS troponin is likely related to demand ischemia; doubt ACS based on symptoms  Morbid obesity with BMI  of 50.0-59.9, adult (HCC) -Body mass index is 51.01 kg/m..  -Weight loss should be encouraged -Outpatient PCP/bariatric medicine/bariatric surgery f/u encouraged   New onset atrial fibrillation [facility]) Patient reports h/o afib but this is not evident on brief chart review Undetermined rhythm at times but appeared to have clear afib on telemetry in SDU this AM Poor rate control without improvement with diltiazem push Will start cardizem drip (recent echo with preserved EF) Already on Eliquis, will continue Cardiology consulted  History of pulmonary embolism Continue Eliquis  Elevated troponin Likely secondary to demand ischemia in the setting of acute on chronic respiratory failure with hypoxia and with acute hypercapnia in conjunction with new-onset afib Downtrending from 40>> 38 Cardiology consulted  AKI (acute kidney injury) (HCC) Creatinine increased from 1.04>> 1.46 Likely cardiorenal syndrome with signs of CHF Continue Lasix Continue to follow daily BMP  Obesity hypoventilation syndrome (HCC) Continue BIPAP prn  HTN (hypertension) Stop amlodipine in the setting of Cardizem drip, new-onset afib     Consultants: Cardiology  Procedures: Limited echo  Antibiotics: None  30 Day Unplanned Readmission Risk Score    Flowsheet Row ED to Hosp-Admission (Current) from 01/02/2023 in [place] [facility] UNIT  30 Day Unplanned Readmission Risk Score (%) 26.97 Filed at 01/04/2023 0801       This score is the patient's risk of an unplanned readmission within 30 days of being discharged (0 -100%). The score is based on dignosis, age, lab data, medications, orders, and past utilization.   Low:  0-14.9   Medium: 15-21.9   High: 22-29.9   Extreme:  30 and above           Subjective: Feeling better today.  On home 2L [place] O2.  No chest pain.   Objective: Vitals:   01/04/23 1500 01/04/23 1526  BP: (!) 88/69   Pulse: 96   Resp: 14   Temp:  98 F (36.7 C)  SpO2:  91%     Intake/Output Summary (Last 24 hours) at 01/04/2023 1623 Last data filed at 01/04/2023 1300 Gross per 24 hour  Intake 253.33 ml  Output 550 ml  Net -296.67 ml   Filed Weights   01/03/23 0711 01/04/23 0400 01/04/23 0800  Weight: 126.5 kg 126.5 kg 126.5 kg    Exam:  General:  Appears calm and comfortable and is in NAD, on [place] O2 Eyes:   EOMI, normal lids, iris ENT:  grossly normal hearing, lips & tongue; BIPAP in place Neck:  no LAD, masses or thyromegaly Cardiovascular:  Irregularly irregular with controlled rate, + systolic murmur, no r/g. Somewhat obscured due to habitus.  No LE edema.  Respiratory:   CTA bilaterally with no rales/rhonchi, slight wheezing.  Normal respiratory effort. Abdomen:  soft, NT, ND Skin:  no rash or induration seen on limited exam Musculoskeletal:  grossly normal tone BUE/BLE, good ROM, no bony abnormality Psychiatric:  blunted mood and affect, speech dysarthric at baseline and appropriate, AOx3 Neurologic:  CN 2-12 grossly intact, moves all extremities in coordinated fashion   Data Reviewed: I have reviewed the patient's lab results since admission.  Pertinent labs for today include:  CO2 36 Glucose 118 BUN 39/Creatinine 1.46/GFR 41; 28/1.18/53 on 8/19 Procalcitonin <0.10 WBC 14.3 Hgb 10.4     Family Communication: None present  Disposition: Status is: Inpatient Remains inpatient appropriate because: Cardizem drip  Planned Discharge Destination: Home with Home Health    Total critical care time: 45 minutes Critical care time was exclusive of separately billable procedures and treating other patients. Critical care was necessary to treat or prevent imminent or life-threatening deterioration. Critical care was time spent personally by me on the following activities: development of treatment plan with patient and/or surrogate as well as nursing, discussions with consultants, evaluation of patient's response to treatment, examination  of patient, obtaining history from patient or surrogate, ordering and performing treatments and interventions, ordering and review of laboratory studies, ordering and review of radiographic studies, pulse oximetry and re-evaluation of patient's condition.   Author: Jonah Blue, MD 01/04/2023 4:23 PM  For on call review [email or web address].

## 2023-01-04 NOTE — Assessment & Plan Note (Signed)
-  Body mass index is 51.01 kg/m..  -Weight loss should be encouraged -Outpatient PCP/bariatric medicine/bariatric surgery f/u encouraged

## 2023-01-05 DIAGNOSIS — I4819 Other persistent atrial fibrillation: Secondary | ICD-10-CM | POA: Diagnosis not present

## 2023-01-05 DIAGNOSIS — J9601 Acute respiratory failure with hypoxia: Secondary | ICD-10-CM | POA: Diagnosis not present

## 2023-01-05 DIAGNOSIS — J9602 Acute respiratory failure with hypercapnia: Secondary | ICD-10-CM | POA: Diagnosis not present

## 2023-01-05 LAB — CBC WITH DIFFERENTIAL/PLATELET
Abs Immature Granulocytes: 0.03 10*3/uL (ref 0.00–0.07)
Basophils Absolute: 0 10*3/uL (ref 0.0–0.1)
Basophils Relative: 0 %
Eosinophils Absolute: 0.1 10*3/uL (ref 0.0–0.5)
Eosinophils Relative: 1 %
HCT: 39.5 % (ref 36.0–46.0)
Hemoglobin: 11.4 g/dL — ABNORMAL LOW (ref 12.0–15.0)
Immature Granulocytes: 0 %
Lymphocytes Relative: 18 %
Lymphs Abs: 1.5 10*3/uL (ref 0.7–4.0)
MCH: 24.2 pg — ABNORMAL LOW (ref 26.0–34.0)
MCHC: 28.9 g/dL — ABNORMAL LOW (ref 30.0–36.0)
MCV: 83.9 fL (ref 80.0–100.0)
Monocytes Absolute: 0.6 10*3/uL (ref 0.1–1.0)
Monocytes Relative: 8 %
Neutro Abs: 6 10*3/uL (ref 1.7–7.7)
Neutrophils Relative %: 73 %
Platelets: 435 10*3/uL — ABNORMAL HIGH (ref 150–400)
RBC: 4.71 MIL/uL (ref 3.87–5.11)
RDW: 18 % — ABNORMAL HIGH (ref 11.5–15.5)
WBC: 8.3 10*3/uL (ref 4.0–10.5)
nRBC: 0 % (ref 0.0–0.2)

## 2023-01-05 LAB — BASIC METABOLIC PANEL
Anion gap: 8 (ref 5–15)
BUN: 46 mg/dL — ABNORMAL HIGH (ref 8–23)
CO2: 35 mmol/L — ABNORMAL HIGH (ref 22–32)
Calcium: 8.6 mg/dL — ABNORMAL LOW (ref 8.9–10.3)
Chloride: 92 mmol/L — ABNORMAL LOW (ref 98–111)
Creatinine, Ser: 1.4 mg/dL — ABNORMAL HIGH (ref 0.44–1.00)
GFR, Estimated: 43 mL/min — ABNORMAL LOW (ref >60)
Glucose, Bld: 108 mg/dL — ABNORMAL HIGH (ref 70–99)
Potassium: 3.4 mmol/L — ABNORMAL LOW (ref 3.5–5.1)
Sodium: 135 mmol/L (ref 135–145)

## 2023-01-05 LAB — TSH: TSH: 1.878 u[IU]/mL (ref 0.350–4.500)

## 2023-01-05 MED ORDER — POTASSIUM CHLORIDE CRYS ER 20 MEQ PO TBCR
40.0000 meq | EXTENDED_RELEASE_TABLET | Freq: Once | ORAL | Status: AC
  Administered 2023-01-05: 40 meq via ORAL
  Filled 2023-01-05: qty 2

## 2023-01-05 NOTE — Evaluation (Signed)
Physical Therapy Evaluation Patient Details Name: Emma Morris MRN: [id number] DOB: November 09, 1960 Today's Date: 01/05/2023  History of Present Illness  Emma Morris is a 62 y.o. female with medical history significant of anemia, obesity hypoventilation syndrome, hypertension, history of PE on Eliquis, and more presents the ED with a chief complaint of shortness of breath.  Patient came in via EMS from home with shortness of breath.  She wears 2 L nasal cannula at home but was satting 80% on that.  She was placed on 4 L nasal cannula and her O2 sats improved to 90%.  When she first arrived to the ED her O2 sats were in the 70s on 5 L nasal cannula.  She was placed on rebreather before ultimately being started on BiPAP.  Unfortunately, patient is not able to provide any history at this time due to acute illness.  She is somnolent and on the BiPAP.    Clinical Impression  Patient lying in bed on therapist arrives; states she does not want to get up into the chair as she has been in the chair "all morning"' assisted by nursing.  Patient is agreeable to stand up to the RW at the side of the bed.  Patient performs bed mobility with SBA/Supervision with HOB slightly elevated; taking extra time and effort due to general weakness and shortness of breath.  Once sitting, patient demonstrates good sitting balance on the edge of the bed with feet and hands supported.  Patient then performs sit to stand with min A to RW needed due to leg weakness and is able to march in place a few steps before needing to return to sitting due to fatigue.  patient left in bed with call button in reach and nursing notified of mobility status. Patient will benefit from continued skilled therapy services during the remainder of her hospital stay and at the next recommended venue of care to address deficits and promote return to optimal function.             If plan is discharge home, recommend the following: A little help  with walking and/or transfers;A little help with bathing/dressing/bathroom;Help with stairs or ramp for entrance;Assistance with cooking/housework   Can travel by private vehicle        Equipment Recommendations None recommended by PT  Recommendations for Other Services       Functional Status Assessment Patient has had a recent decline in their functional status and demonstrates the ability to make significant improvements in function in a reasonable and predictable amount of time.     Precautions / Restrictions Precautions Precautions: Fall Restrictions Weight Bearing Restrictions: No      Mobility  Bed Mobility Overal bed mobility: Needs Assistance Bed Mobility: Supine to Sit     Supine to sit: HOB elevated, Used rails, Supervision     General bed mobility comments: takes extra time and effort to perform    Transfers Overall transfer level: Needs assistance Equipment used: Rolling walker (2 wheels) Transfers: Sit to/from Stand Sit to Stand: Contact guard assist           General transfer comment: leans on RW; trunk flexed; marched in place; did not attempt to walk to chair as patient states she has been up in the chair all morning.    Ambulation/Gait                  Careers information officer  Tilt Bed    Modified Rankin (Stroke Patients Only)       Balance Overall balance assessment: Needs assistance Sitting-balance support: Feet supported, Bilateral upper extremity supported Sitting balance-Leahy Scale: Good Sitting balance - Comments: good sitting balance on edge of bed   Standing balance support: Bilateral upper extremity supported, Reliant on assistive device for balance, During functional activity Standing balance-Leahy Scale: Fair Standing balance comment: fair standing balance with RW; tends to forward flex at the trunk and lean on the walker; fatigues quickly so did a few marches but did not attempt  ambulation.                             Pertinent Vitals/Pain Pain Assessment Pain Assessment: 0-10 Pain Score: 0-No pain    Home Living Family/patient expects to be discharged to:: Private residence Living Arrangements: Children Available Help at Discharge: Available PRN/intermittently;Family Type of Home: Apartment Home Access: Stairs to enter Entrance Stairs-Rails: None Entrance Stairs-Number of Steps: 4   Home Layout: One level Home Equipment: Grab bars - tub/shower;Rolling Walker (2 wheels);Shower seat      Prior Function Prior Level of Function : Needs assist       Physical Assist : ADLs (physical)   ADLs (physical): Bathing Mobility Comments: Engineer, 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 ADLs Comments: she does get some assist with showering     Extremity/Trunk Assessment   Upper Extremity Assessment Upper Extremity Assessment: Generalized weakness    Lower Extremity Assessment Lower Extremity Assessment: Generalized weakness    Cervical / Trunk Assessment Cervical / Trunk Assessment: Normal  Communication   Communication Communication: No apparent difficulties  Cognition Arousal: Alert Behavior During Therapy: WFL for tasks assessed/performed Overall Cognitive Status: Within Functional Limits for tasks assessed                                          General Comments      Exercises     Assessment/Plan    PT Assessment Patient needs continued PT services  PT Problem List Decreased strength;Decreased activity tolerance;Decreased balance;Decreased mobility;Cardiopulmonary status limiting activity       PT Treatment Interventions Gait training;Stair training;Functional mobility training;Therapeutic activities;Therapeutic exercise;Balance training    PT Goals (Current goals can be found in the Care Plan section)  Acute Rehab PT Goals Patient Stated Goal: return home PT Goal Formulation: With patient Time For Goal Achievement:  01/19/23 Potential to Achieve Goals: Good    Frequency Min 2X/week     Co-evaluation               AM-PAC PT "6 Clicks" Mobility  Outcome Measure Help needed turning from your back to your side while in a flat bed without using bedrails?: A Little Help needed moving from lying on your back to sitting on the side of a flat bed without using bedrails?: A Little Help needed moving to and from a bed to a chair (including a wheelchair)?: A Little Help needed standing up from a chair using your arms (e.g., wheelchair or bedside chair)?: A Little Help needed to walk in hospital room?: A Lot Help needed climbing 3-5 steps with a railing? : A Lot 6 Click Score: 16    End of Session Equipment Utilized During Treatment: Oxygen Activity Tolerance: Patient limited by lethargy;Patient tolerated treatment well Patient left: in bed;with call bell/phone within  reach Nurse Communication: Mobility status PT Visit Diagnosis: Muscle weakness (generalized) (M62.81);Other abnormalities of gait and mobility (R26.89)    Time: 0981-1914 PT Time Calculation (min) (ACUTE ONLY): 17 min   Charges:   PT Evaluation $PT Eval Low Complexity: 1 Low   PT General Charges $$ ACUTE PT VISIT: 1 Visit         2:52 PM, 01/05/23 Kambryn Dapolito Small Maisa Bedingfield MPT [facility] [place] [phone] Ph:[phone]

## 2023-01-05 NOTE — Plan of Care (Signed)
  Problem: Education: Goal: Knowledge of General Education information will improve Description: Including pain rating scale, medication(s)/side effects and non-pharmacologic comfort measures Outcome: Progressing   Problem: Health Behavior/Discharge Planning: Goal: Ability to manage health-related needs will improve Outcome: Progressing   Problem: Clinical Measurements: Goal: Ability to maintain clinical measurements within normal limits will improve Outcome: Progressing Goal: Will remain free from infection Outcome: Progressing Goal: Respiratory complications will improve Outcome: Progressing   Problem: Activity: Goal: Risk for activity intolerance will decrease Outcome: Progressing   Problem: Nutrition: Goal: Adequate nutrition will be maintained Outcome: Progressing   Problem: Coping: Goal: Level of anxiety will decrease Outcome: Progressing   Problem: Elimination: Goal: Will not experience complications related to bowel motility Outcome: Progressing Goal: Will not experience complications related to urinary retention Outcome: Progressing   Problem: Pain Managment: Goal: General experience of comfort will improve Outcome: Progressing   Problem: Safety: Goal: Ability to remain free from injury will improve Outcome: Progressing   Problem: Skin Integrity: Goal: Risk for impaired skin integrity will decrease Outcome: Progressing   

## 2023-01-05 NOTE — Progress Notes (Signed)
Progress Note   Patient: Emma Morris HQI:[id number] DOB: May 22, 1960 DOA: 01/02/2023     3 DOS: the patient was seen and examined on 01/05/2023   Brief hospital course: 62yo female with h/o anemia, OHS on 2L home O2, HTN, and PE on Eliquis who presented on 8/19 with SOB.  O2 sats 80% with EMS despite home O2, increased to 4L but still in 70s in ER so increased to 5L -> NRB -> BIPAP.  Multifactorial acute on chronic respiratory failure associated with CHF + restrictive/obstructive lung disease.  COVID negative.  CXR with ?edema.  Given Lasix, Solumedrol.  Elevated troponin, likely demand, Echo pending.  Found to have new-onset afib, already on Eliquis.  Transitioned off diltiazem drip to PO Cardizem.  Assessment and Plan: * Acute respiratory failure with hypoxia and hypercapnia (HCC) On 2L chronic [place] O2, presented with worsening SOB over the last 2 days Initial blood gas shows pCO2 of 75, then 83, then after starting BiPAP improving down to 67, 71 Found to be in afib - possibly as the cause of respiratory distress with CHF vs. Caused by respiratory distress - likely multifactorial from concomitant OHS Has transitioned off BIPAP COVID-negative Chest x-ray on admission showed cardiomegaly, with possible pulmonary edema Continue diuretics/Fluid restriction Continue breathing treatments Solu-Medrol given in the ED but not frankly wheezing so steroids stopped Procalcitonin negative, no current indication for abx Echo ordered and did not show decreased EF Admitted to SDU, transferred to telemetry on 8/21  Acute on chronic diastolic CHF (congestive heart failure) (HCC) -Patient with known h/o chronic diastolic CHF presenting with worsening SOB and hypoxia -CXR consistent with mild pulmonary edema -New onset afib likely contributing -Repeat limited echocardiogram showed no significant change in EF -Was given Lasix 60 mg x 1 in ER and will repeat with 40 mg IV BID -Mildly elevated HS  troponin is likely related to demand ischemia; doubt ACS based on symptoms  Morbid obesity with BMI of 50.0-59.9, adult (HCC) -Body mass index is 51.01 kg/m..  -Weight loss should be encouraged -Outpatient PCP/bariatric medicine/bariatric surgery f/u encouraged   New onset atrial fibrillation (HCC) New onset afib Poor rate control without improvement with diltiazem push Started cardizem drip (recent echo with preserved EF), good control, transitioned to PO Cardizem Already on Eliquis, will continue Cardiology consulted  History of pulmonary embolism Continue Eliquis  Elevated troponin Likely secondary to demand ischemia in the setting of acute on chronic respiratory failure with hypoxia and with acute hypercapnia in conjunction with new-onset afib Downtrending from 40>> 38 Cardiology consulted  AKI (acute kidney injury) (HCC) Creatinine in 1.4 region, slightly higher than baseline Likely cardiorenal syndrome with signs of CHF Continue Lasix Continue to follow daily BMP  Obesity hypoventilation syndrome (HCC) On BIPAP early on but has transitioned back to home O2  HTN (hypertension) Stop amlodipine in the setting of Cardizem initiation, new-onset afib    Consultants: Cardiology   Procedures: Limited echo   Antibiotics: None  30 Day Unplanned Readmission Risk Score    Flowsheet Row ED to Hosp-Admission (Current) from 01/02/2023 in [place] [facility]  30 Day Unplanned Readmission Risk Score (%) 28.87 Filed at 01/05/2023 0801       This score is the patient's risk of an unplanned readmission within 30 days of being discharged (0 -100%). The score is based on dignosis, age, lab data, medications, orders, and past utilization.   Low:  0-14.9   Medium: 15-21.9   High: 22-29.9   Extreme:  30 and above           Subjective: Continues to feel some better.  Has not been out of bed, no PT consult as yet.   Objective: Vitals:   01/05/23 0958 01/05/23  1123  BP: 98/67 105/67  Pulse: 97 93  Resp: 20 19  Temp:  98 F (36.7 C)  SpO2: 91% (!) 88%    Intake/Output Summary (Last 24 hours) at 01/05/2023 1312 Last data filed at 01/05/2023 1131 Gross per 24 hour  Intake 460 ml  Output 1200 ml  Net -740 ml   Filed Weights   01/04/23 0800 01/05/23 0500 01/05/23 0800  Weight: 126.5 kg 126.5 kg 126.5 kg    Exam:  General:  Appears calm and comfortable and is in NAD, on [place] O2 Eyes:   EOMI, normal lids, iris ENT:  grossly normal hearing, lips & tongue Neck:  no LAD, masses or thyromegaly Cardiovascular:  Irregularly irregular with controlled rate, + systolic murmur, no r/g. Somewhat obscured due to habitus.  No LE edema.  Respiratory:   CTA bilaterally with no rales/rhonchi, slight wheezing.  Normal respiratory effort. Abdomen:  soft, NT, ND Skin:  no rash or induration seen on limited exam Musculoskeletal:  grossly normal tone BUE/BLE, good ROM, no bony abnormality Psychiatric:  blunted mood and affect, speech dysarthric at baseline and appropriate, AOx3 Neurologic:  CN 2-12 grossly intact, moves all extremities in coordinated fashion  Data Reviewed: I have reviewed the patient's lab results since admission.  Pertinent labs for today include:   K+ 3.4 CO2 35 Glucose 108 BUN 46/Creatinine 1.4/GFR 43 Hgb 11.4 - stable TSH 1.878    Family Communication: None present  Disposition: Status is: Inpatient Remains inpatient appropriate because: still diuresing  Planned Discharge Destination: Home with Home Health    Time spent: 50 minutes  Author: Jonah Blue, MD 01/05/2023 1:12 PM  For on call review [email or web address].

## 2023-01-05 NOTE — Plan of Care (Signed)
  Problem: Acute Rehab PT Goals(only PT should resolve) Goal: Pt Will Go Supine/Side To Sit Outcome: Progressing Flowsheets (Taken 01/05/2023 1453) Pt will go Supine/Side to Sit: with modified independence Goal: Patient Will Transfer Sit To/From Stand Outcome: Progressing Flowsheets (Taken 01/05/2023 1453) Patient will transfer sit to/from stand:  with contact guard assist  with supervision Goal: Pt Will Transfer Bed To Chair/Chair To Bed Outcome: Progressing Flowsheets (Taken 01/05/2023 1453) Pt will Transfer Bed to Chair/Chair to Bed:  with contact guard assist  with supervision Goal: Pt Will Ambulate Outcome: Progressing Flowsheets (Taken 01/05/2023 1453) Pt will Ambulate:  25 feet  with rolling walker  with contact guard assist

## 2023-01-05 NOTE — TOC Initial Note (Signed)
Transition of Care [facility]) - Initial/Assessment Note    Patient Details  Name: Emma Morris MRN: [id number] Date of Birth: May 17, 1961  Transition of Care [facility]) CM/SW Contact:    Leitha Bleak, RN Phone Number: 01/05/2023, 1:18 PM  Clinical Narrative:      Patient admitted with acute respiratory failure with hypoxia. Patient has a high risk of readmission. PT eval is pending. CM spoke with patient, she lives with her son and walks with a walker. Her family provides transport or she will schedule with [facility]. She is agreeable to HHPT if recommended, is not interested in going to SNF.  TOC following.      Expected Discharge Plan: Home w Home Health Services Barriers to Discharge: Continued Medical Work up   Patient Goals and CMS Choice Patient states their goals for this hospitalization and ongoing recovery are:: to go home. CMS Medicare.gov Compare Post Acute Care list provided to:: Patient      Expected Discharge Plan and Services      Living arrangements for the past 2 months: Single Family Home                    Prior Living Arrangements/Services Living arrangements for the past 2 months: Single Family Home Lives with:: Adult Children Patient language and need for interpreter reviewed:: Yes        Need for Family Participation in Patient Care: Yes (Comment) Care giver support system in place?: Yes (comment)   Criminal Activity/Legal Involvement Pertinent to Current Situation/Hospitalization: No - Comment as needed  Activities of Daily Living Home Assistive Devices/Equipment: Oxygen, Walker (specify type) ADL Screening (condition at time of admission) Patient's cognitive ability adequate to safely complete daily activities?: Yes Is the patient deaf or have difficulty hearing?: No Does the patient have difficulty seeing, even when wearing glasses/contacts?: No Does the patient have difficulty concentrating, remembering, or making decisions?: No Patient able to  express need for assistance with ADLs?: Yes Does the patient have difficulty dressing or bathing?: No Independently performs ADLs?: Yes (appropriate for developmental age) Does the patient have difficulty walking or climbing stairs?: Yes Weakness of Legs: Both Weakness of Arms/Hands: Both  Permission Sought/Granted          Emotional Assessment     Affect (typically observed): Accepting Orientation: : Oriented to Self, Oriented to Place, Oriented to Situation Alcohol / Substance Use: Not Applicable Psych Involvement: No (comment)  Admission diagnosis:  Acute pulmonary edema (HCC) [J81.0] Elevated troponin [R79.89] COPD exacerbation (HCC) [J44.1] Acute respiratory failure with hypoxia and hypercapnia (HCC) [J96.01, J96.02] Acute on chronic respiratory failure with hypoxia and hypercapnia (HCC) [N82.95, J96.22] Patient Active Problem List   Diagnosis Date Noted   Morbid obesity with BMI of 50.0-59.9, adult (HCC) 01/04/2023   Acute on chronic diastolic CHF (congestive heart failure) (HCC) 01/04/2023   AKI (acute kidney injury) (HCC) 01/03/2023   Elevated troponin 01/03/2023   History of pulmonary embolism 01/03/2023   New onset atrial fibrillation (HCC) 01/03/2023   Acute respiratory failure with hypoxia and hypercapnia (HCC) 01/02/2023   Acute on chronic respiratory failure with hypoxia (HCC) 11/27/2022   PE (pulmonary thromboembolism) (HCC) 11/27/2022   Obesity hypoventilation syndrome (HCC) 07/26/2022   Asthma, mild 07/26/2022   Acute respiratory failure (HCC) 07/26/2022   Folic acid deficiency 03/29/2022   Acute respiratory failure with hypoxia (HCC) 03/28/2022   Hypokalemia 03/28/2022   HTN (hypertension) 03/28/2022   Asthma, chronic obstructive, with acute exacerbation (HCC) 03/28/2022   CAP (community  acquired pneumonia) 03/28/2022   Cardiomegaly 03/28/2022   Anemia    Constipation    Rectal bleeding    Encounter for screening colonoscopy 09/27/2019    Menometrorrhagia 05/03/2013   Vaginal bleeding 05/02/2013   Anemia due to chronic blood loss 05/02/2013   PCP:  Alvina Filbert, MD Pharmacy:   [facility] [address] [place] - [address] [place] [address] Phone: [phone] Fax: [phone]  [facility] - [place], [place] - [address] [place] [address] Phone: [phone] Fax: [phone]   Social Determinants of Health (SDOH) Social History: SDOH Screenings   Food Insecurity: No Food Insecurity (01/02/2023)  Housing: Low Risk  (01/02/2023)  Transportation Needs: No Transportation Needs (01/02/2023)  Utilities: Not At Risk (01/02/2023)  Financial Resource Strain: Medium Risk (07/26/2022)   Received from [facility], [facility]  Social Connections: Unknown (04/06/2022)   Received from [facility], [facility]  Tobacco Use: Low Risk  (01/02/2023)   SDOH Interventions:     Readmission Risk Interventions    01/05/2023    1:17 PM 01/03/2023    1:30 PM 12/01/2022   10:47 AM  Readmission Risk Prevention Plan  Post Dischage Appt   Complete  Medication Screening   Complete  Transportation Screening Complete Complete Complete  PCP or Specialist Appt within 3-5 Days Not Complete Not Complete   HRI or Home Care Consult Complete    Social Work Consult for Recovery Care Planning/Counseling Complete    Palliative Care Screening Not Applicable Not Applicable   Medication Review Oceanographer) Complete Complete

## 2023-01-05 NOTE — Progress Notes (Addendum)
Progress Note  Patient Name: REGNIA SAVASTANO Date of Encounter: 01/05/2023  Primary Cardiologist: Nona Dell, MD  Subjective   Breathing feels like it is improving this AM. No CP. Currently doing neb.  Inpatient Medications    Scheduled Meds:  apixaban  5 mg Oral BID   Chlorhexidine Gluconate Cloth  6 each Topical Q0600   diltiazem  30 mg Oral Q6H   furosemide  40 mg Intravenous BID   ipratropium-albuterol  3 mL Nebulization Q6H    PRN Meds: acetaminophen **OR** acetaminophen, albuterol, morphine injection, ondansetron **OR** ondansetron (ZOFRAN) IV, oxyCODONE   Vital Signs    Vitals:   01/05/23 0630 01/05/23 0754 01/05/23 0800 01/05/23 0843  BP: 114/78     Pulse:      Resp:      Temp:  (!) 97.3 F (36.3 C)    TempSrc:  Oral    SpO2:   90% 90%  Weight:   126.5 kg   Height:   5\' 2"  (1.575 m)     Intake/Output Summary (Last 24 hours) at 01/05/2023 0853 Last data filed at 01/05/2023 0545 Gross per 24 hour  Intake 466.92 ml  Output 1040 ml  Net -573.08 ml      01/05/2023    8:00 AM 01/05/2023    5:00 AM 01/04/2023    8:00 AM  Last 3 Weights  Weight (lbs) 278 lb 14.1 oz 278 lb 14.1 oz 278 lb 14.1 oz  Weight (kg) 126.5 kg 126.5 kg 126.5 kg     Telemetry    AF rates 80s-90s, generally CVR - Personally Reviewed  Physical Exam   GEN: No acute distress. Morbidly obese. Using neb. HEENT: Normocephalic, atraumatic, sclera non-icteric. Neck: No JVD or bruits. Cardiac: irregularly irregular, no murmurs, rubs, or gallops.  Respiratory: Diffusely diminished bilaterally. Breathing is unlabored. GI: Soft, nontender, non-distended, BS +x 4. MS: no deformity. Extremities: No clubbing or cyanosis. Nonpitting trace BLE edema superimposed on large baseline leg habitus. Distal pedal pulses are 2+ and equal bilaterally. Neuro:  AAOx3. Follows commands. Psych:  Responds to questions appropriately with a normal affect.  Labs    High Sensitivity Troponin:   Recent  Labs  Lab 12/27/22 0027 12/27/22 0236 01/02/23 1523 01/02/23 1656  TROPONINIHS 5 5 40* 38*      Chemistry Recent Labs  Lab 01/03/23 0418 01/04/23 0457 01/05/23 0823  NA 137 136 135  K 4.2 4.1 3.4*  CL 95* 92* 92*  CO2 33* 36* 35*  GLUCOSE 153* 118* 108*  BUN 28* 39* 46*  CREATININE 1.18* 1.46* 1.40*  CALCIUM 8.2* 8.7* 8.6*  PROT 8.5*  --   --   ALBUMIN 2.6*  --   --   AST 10*  --   --   ALT 12  --   --   ALKPHOS 51  --   --   BILITOT 0.7  --   --   GFRNONAA 53* 41* 43*  ANIONGAP 9 8 8      Hematology Recent Labs  Lab 01/02/23 1523 01/03/23 0418 01/04/23 0457  WBC 10.9* 9.7 14.3*  RBC 4.51 4.23 4.29  HGB 10.9* 10.3* 10.4*  HCT 38.7 36.3 36.3  MCV 85.8 85.8 84.6  MCH 24.2* 24.3* 24.2*  MCHC 28.2* 28.4* 28.7*  RDW 18.1* 18.3* 18.0*  PLT 396 383 389    BNP Recent Labs  Lab 01/02/23 1523  BNP 33.0     Radiology    ECHOCARDIOGRAM LIMITED  Result Date: 01/04/2023  ECHOCARDIOGRAM LIMITED REPORT   Patient Name:   ANAMAE BARNICK Date of Exam: 01/04/2023 Medical Rec #:  [id number]          Height:       62.0 in Accession #:    [id number]         Weight:       278.9 lb Date of Birth:  12-04-1960         BSA:          2.202 m Patient Age:    62 years           BP:           94/61 mmHg Patient Gender: F                  HR:           90 bpm. [facility] Procedure: Limited Echo and Cardiac Doppler Indications:    Atrial Fibrillation I48.91  History:        Patient has prior history of Echocardiogram examinations, most                 recent 11/28/2022. Cardiomegaly; Risk Factors:Hypertension and                 Non-Smoker.  Sonographer:    Celesta Gentile RCS Referring Phys: [id number] JENNIFER YATES IMPRESSIONS  1. Left ventricular ejection fraction, by estimation, is 65 to 70%. The left ventricle has normal function. The left ventricle has no regional wall motion abnormalities. There is mild concentric left ventricular hypertrophy. Left ventricular diastolic  parameters are indeterminate. There is the interventricular septum is flattened in diastole ('D' shaped left ventricle), consistent with right ventricular volume overload.  2. Right ventricular systolic function is low normal. The right ventricular size is mildly enlarged. Tricuspid regurgitation signal is inadequate for assessing PA pressure.  3. A small pericardial effusion is present. The pericardial effusion is posterior to the left ventricle.  4. The aortic valve is tricuspid. There is mild calcification of the aortic valve. Aortic valve regurgitation is not visualized. Aortic valve sclerosis is present, with no evidence of aortic valve stenosis.  5. The inferior vena cava is dilated in size with <50% respiratory variability, suggesting right atrial pressure of 15 mmHg. Comparison(s): Prior images reviewed side by side. LVEF remains normal at 65-70%. Mild RV enlargement, small posterior pericardial effusion. FINDINGS  Left Ventricle: Left ventricular ejection fraction, by estimation, is 65 to 70%. The left ventricle has normal function. The left ventricle has no regional wall motion abnormalities. The left ventricular internal cavity size was normal in size. There is  mild concentric left ventricular hypertrophy. The interventricular septum is flattened in diastole ('D' shaped left ventricle), consistent with right ventricular volume overload. Left ventricular diastolic parameters are indeterminate. Left ventricular diastolic function could not be evaluated due to atrial fibrillation. Right Ventricle: The right ventricular size is mildly enlarged. No increase in right ventricular wall thickness. Right ventricular systolic function is low normal. Tricuspid regurgitation signal is inadequate for assessing PA pressure. Left Atrium: Left atrial size was normal in size. Right Atrium: Right atrial size was normal in size. Pericardium: A small pericardial effusion is present. The pericardial effusion is posterior to  the left ventricle. Presence of epicardial fat layer. Aortic Valve: The aortic valve is tricuspid. There is mild calcification of the aortic valve. There is mild aortic valve annular calcification. Aortic valve regurgitation is not visualized. Aortic valve sclerosis is present,  with no evidence of aortic valve stenosis. Aorta: The aortic root is normal in size and structure. Venous: The inferior vena cava is dilated in size with less than 50% respiratory variability, suggesting right atrial pressure of 15 mmHg. IAS/Shunts: No atrial level shunt detected by color flow Doppler. LEFT VENTRICLE PLAX 2D LVIDd:         3.60 cm LVIDs:         2.00 cm LV PW:         1.20 cm LV IVS:        1.30 cm LVOT diam:     1.80 cm LVOT Area:     2.54 cm  RIGHT VENTRICLE TAPSE (M-mode): 1.5 cm LEFT ATRIUM         Index       RIGHT ATRIUM           Index LA diam:    3.90 cm 1.77 cm/m  RA Area:     19.50 cm                                 RA Volume:   53.75 ml  24.41 ml/m   AORTA Ao Root diam: 3.10 cm MITRAL VALVE MV Area (PHT): 4.68 cm     SHUNTS MV Decel Time: 162 msec     Systemic Diam: 1.80 cm MV E velocity: 123.00 cm/s Nona Dell MD Electronically signed by Nona Dell MD Signature Date/Time: 01/04/2023/4:17:19 PM    Final     Cardiac Studies   Limited echo 01/04/23   1. Left ventricular ejection fraction, by estimation, is 65 to 70%. The  left ventricle has normal function. The left ventricle has no regional  wall motion abnormalities. There is mild concentric left ventricular  hypertrophy. Left ventricular diastolic  parameters are indeterminate. There is the interventricular septum is  flattened in diastole ('D' shaped left ventricle), consistent with right  ventricular volume overload.   2. Right ventricular systolic function is low normal. The right  ventricular size is mildly enlarged. Tricuspid regurgitation signal is  inadequate for assessing PA pressure.   3. A small pericardial effusion is  present. The pericardial effusion is  posterior to the left ventricle.   4. The aortic valve is tricuspid. There is mild calcification of the  aortic valve. Aortic valve regurgitation is not visualized. Aortic valve  sclerosis is present, with no evidence of aortic valve stenosis.   5. The inferior vena cava is dilated in size with <50% respiratory  variability, suggesting right atrial pressure of 15 mmHg.   Patient Profile     62 y.o. female with PE dx 11/2022, anemia, arthritis, HTN, morbid obesity with OHS, chronic respiratory failure on home O2 admitted with a/c hypoxic/hypercarbic respiratory failure, AKI, suspected a/c HFpEF, mildly elevated troponin, new onset AF. She had found her oxygen tubing was not completely connected, possibly exacerbating the problem.  Assessment & Plan    1. Acute on chronic respiratory failure with hypoxia and hypercapnia - requiring BiPAP on arrival, downgraded to HFNC - likely multifactorial - Covid negative  2. Acute on chronic HFpEF with suspected RV overload - prior RV reported as normal 11/2022 but this admission showing low normal RVSF, mildly enlarged RV, and D-shaped septum flattened in diastole c/w RV overload - suspect driven by multiple issues putting pressure on lungs - I/O's not incredibly impressive though BP prohibits aggressive measures - will discuss with MD  3. Newly diagnosed persistent atrial fibrillation - in sinus tach in ED, then subsequently afib now rate controlled on oral diltiazem - will continue short acting today given need for diuresis and low-normal BP at times - check thyroid with labs - continue Eliquis - given acute exacerbation of respiratory disease, do not favor TEE/DCCV approach since now rate controlled - would anticipate 3 weeks of uninterrupted OAC then consideration of outpatient DCCV keeping in mind body habitus may make it challenging to maintain NSR going forward  4. Morbid obesity with OHS - would benefit  from outpatient weight management program if not already enrolled, possible GLP1 - would beneift from OP sleep study and pulm f/u  5. Mildly elevated troponin (40->38) - do not suspect ACS at this time, suspect demand process  5. H/o PE 11/2022 - continue OAC  6. Anemia - per medicine team  7. AKI with hypoalbuminemia - f/u BMET pending - check CMET in AM as well  8. Small pericardial effusion - not clinically significant at this time, may be due to fluid overload  For questions or updates, please contact [facility] Please consult [email or web address] for contact info under Cardiology/STEMI.  Signed, Laurann Montana, PA-C 01/05/2023, 8:53 AM     Attending note:  Patient seen and examined.  Interval chart reviewed since yesterday.  Patient reports relative improvement in shortness of breath, no chest pain or sense of palpitations.  Afebrile, heart rate in the 70s to 80s largely in atrial fibrillation by telemetry.  Systolic pressure ranging 90s to 110s.  Lungs exhibit decreased breath sounds without wheezing.  Cardiac exam with irregularly irregular rhythm and no gallop.  Pertinent lab work includes potassium 3.4, BUN 46, creatinine 1.40 down from 1.46, hemoglobin 11.4, platelets 435.  Follow-up limited echocardiogram demonstrates LVEF 65 to 70%, low normal RV contraction with mild RV enlargement, small posterior pericardial effusion.  Continue strategy of heart rate control and anticoagulation with newly documented persistent atrial fibrillation.  She is on Eliquis, continue current dose of short acting Cardizem anticipating a switch to Cardizem CD 120 mg daily ultimately.  No clear indication for cardioversion now, this can be considered later as an outpatient.  Jonelle Sidle, M.D., F.A.C.C.

## 2023-01-05 NOTE — Plan of Care (Signed)
  Problem: Education: Goal: Knowledge of General Education information will improve Description: Including pain rating scale, medication(s)/side effects and non-pharmacologic comfort measures Outcome: Progressing   Problem: Health Behavior/Discharge Planning: Goal: Ability to manage health-related needs will improve Outcome: Progressing   Problem: Clinical Measurements: Goal: Ability to maintain clinical measurements within normal limits will improve Outcome: Progressing Goal: Will remain free from infection Outcome: Progressing Goal: Diagnostic test results will improve Outcome: Progressing Goal: Respiratory complications will improve Outcome: Progressing Goal: Cardiovascular complication will be avoided Outcome: Progressing   Problem: Activity: Goal: Risk for activity intolerance will decrease Outcome: Progressing   Problem: Nutrition: Goal: Adequate nutrition will be maintained Outcome: Progressing   Problem: Coping: Goal: Level of anxiety will decrease Outcome: Progressing   Problem: Elimination: Goal: Will not experience complications related to bowel motility Outcome: Progressing   Problem: Pain Managment: Goal: General experience of comfort will improve Outcome: Progressing   Problem: Elimination: Goal: Will not experience complications related to urinary retention Outcome: Progressing   Problem: Safety: Goal: Ability to remain free from injury will improve Outcome: Progressing   Problem: Skin Integrity: Goal: Risk for impaired skin integrity will decrease Outcome: Progressing   Problem: Education: Goal: Ability to demonstrate management of disease process will improve Outcome: Progressing Goal: Ability to verbalize understanding of medication therapies will improve Outcome: Progressing Goal: Individualized Educational Video(s) Outcome: Progressing   Problem: Activity: Goal: Capacity to carry out activities will improve Outcome: Progressing    Problem: Cardiac: Goal: Ability to achieve and maintain adequate cardiopulmonary perfusion will improve Outcome: Progressing   Problem: Education: Goal: Knowledge of disease or condition will improve Outcome: Progressing Goal: Knowledge of the prescribed therapeutic regimen will improve Outcome: Progressing Goal: Individualized Educational Video(s) Outcome: Progressing   Problem: Activity: Goal: Ability to tolerate increased activity will improve Outcome: Progressing Goal: Will verbalize the importance of balancing activity with adequate rest periods Outcome: Progressing   Problem: Respiratory: Goal: Ability to maintain a clear airway will improve Outcome: Progressing Goal: Levels of oxygenation will improve Outcome: Progressing Goal: Ability to maintain adequate ventilation will improve Outcome: Progressing

## 2023-01-06 DIAGNOSIS — N179 Acute kidney failure, unspecified: Secondary | ICD-10-CM | POA: Diagnosis not present

## 2023-01-06 DIAGNOSIS — I5033 Acute on chronic diastolic (congestive) heart failure: Secondary | ICD-10-CM | POA: Diagnosis not present

## 2023-01-06 DIAGNOSIS — Z6841 Body Mass Index (BMI) 40.0 and over, adult: Secondary | ICD-10-CM

## 2023-01-06 DIAGNOSIS — I4819 Other persistent atrial fibrillation: Secondary | ICD-10-CM | POA: Diagnosis not present

## 2023-01-06 DIAGNOSIS — J9601 Acute respiratory failure with hypoxia: Secondary | ICD-10-CM | POA: Diagnosis not present

## 2023-01-06 DIAGNOSIS — I4891 Unspecified atrial fibrillation: Secondary | ICD-10-CM

## 2023-01-06 LAB — COMPREHENSIVE METABOLIC PANEL
ALT: 12 U/L (ref 0–44)
AST: 10 U/L — ABNORMAL LOW (ref 15–41)
Albumin: 2.3 g/dL — ABNORMAL LOW (ref 3.5–5.0)
Alkaline Phosphatase: 41 U/L (ref 38–126)
Anion gap: 6 (ref 5–15)
BUN: 35 mg/dL — ABNORMAL HIGH (ref 8–23)
CO2: 34 mmol/L — ABNORMAL HIGH (ref 22–32)
Calcium: 8.6 mg/dL — ABNORMAL LOW (ref 8.9–10.3)
Chloride: 96 mmol/L — ABNORMAL LOW (ref 98–111)
Creatinine, Ser: 1.01 mg/dL — ABNORMAL HIGH (ref 0.44–1.00)
GFR, Estimated: >60 mL/min (ref >60)
Glucose, Bld: 84 mg/dL (ref 70–99)
Potassium: 3.6 mmol/L (ref 3.5–5.1)
Sodium: 136 mmol/L (ref 135–145)
Total Bilirubin: 0.5 mg/dL (ref 0.3–1.2)
Total Protein: 7.4 g/dL (ref 6.5–8.1)

## 2023-01-06 LAB — CBC
HCT: 39.3 % (ref 36.0–46.0)
Hemoglobin: 11.4 g/dL — ABNORMAL LOW (ref 12.0–15.0)
MCH: 24.2 pg — ABNORMAL LOW (ref 26.0–34.0)
MCHC: 29 g/dL — ABNORMAL LOW (ref 30.0–36.0)
MCV: 83.3 fL (ref 80.0–100.0)
Platelets: 421 10*3/uL — ABNORMAL HIGH (ref 150–400)
RBC: 4.72 MIL/uL (ref 3.87–5.11)
RDW: 18 % — ABNORMAL HIGH (ref 11.5–15.5)
WBC: 6.3 10*3/uL (ref 4.0–10.5)
nRBC: 0 % (ref 0.0–0.2)

## 2023-01-06 MED ORDER — DILTIAZEM HCL ER COATED BEADS 120 MG PO CP24
120.0000 mg | ORAL_CAPSULE | Freq: Every day | ORAL | Status: DC
  Administered 2023-01-06 – 2023-01-08 (×3): 120 mg via ORAL
  Filled 2023-01-06 (×3): qty 1

## 2023-01-06 MED ORDER — IPRATROPIUM-ALBUTEROL 0.5-2.5 (3) MG/3ML IN SOLN
3.0000 mL | Freq: Three times a day (TID) | RESPIRATORY_TRACT | Status: DC
  Administered 2023-01-07 – 2023-01-08 (×4): 3 mL via RESPIRATORY_TRACT
  Filled 2023-01-06 (×4): qty 3

## 2023-01-06 NOTE — Progress Notes (Addendum)
Progress Note   Patient: Emma Morris:[id number] DOB: 1961/04/28 DOA: 01/02/2023     4 DOS: the patient was seen and examined on 01/06/2023   Brief hospital course: No notes on file  Assessment and Plan: * Acute respiratory failure with hypoxia and hypercapnia (HCC) On 2L chronic [place] O2, presented with worsening SOB over the last 2 days Initial blood gas shows pCO2 of 75, then 83, then after starting BiPAP improving down to 67, 71 Found to be in afib - possibly as the cause of respiratory distress with CHF vs. Caused by respiratory distress - likely multifactorial from concomitant OHS Has transitioned off BIPAP COVID-negative Chest x-ray on admission showed cardiomegaly, with possible pulmonary edema Continue diuretics/Fluid restriction Continue breathing treatments Solu-Medrol given in the ED but not frankly wheezing so steroids stopped Procalcitonin negative, no current indication for abx Echo ordered and did not show decreased EF Admitted to SDU, transferred to telemetry on 8/21 Wean O2 to home O2 as tolerated.  Acute on chronic diastolic CHF (congestive heart failure) (HCC) -Patient with known h/o chronic diastolic CHF presenting with worsening SOB and hypoxia -CXR consistent with mild pulmonary edema -New onset afib likely contributing -Repeat limited echocardiogram showed no significant change in EF -Was given Lasix 60 mg x 1 in ER and will repeat with 40 mg IV BID -Mildly elevated HS troponin is likely related to demand ischemia; doubt ACS based on symptoms -Cardiology consulted and following.  Appreciate help.  Morbid obesity with BMI of 50.0-59.9, adult (HCC) -Body mass index is 51.01 kg/m..  -Weight loss should be encouraged -Outpatient PCP/bariatric medicine/bariatric surgery f/u encouraged  -Counseled on diet and lifestyle modifications.  New onset atrial fibrillation (HCC) New onset afib Poor rate control without improvement with diltiazem push Started  cardizem drip (recent echo with preserved EF), good control, transitioned to PO Cardizem Already on Eliquis, will continue Cardiology consulted and following.  Further medications per cardiology.  Appreciate help.  History of pulmonary embolism Continue Eliquis  Elevated troponin Likely secondary to demand ischemia in the setting of acute on chronic respiratory failure with hypoxia and with acute hypercapnia in conjunction with new-onset afib Downtrending from 40>> 38 Cardiology consulted.  Less likely ACS.  AKI (acute kidney injury) (HCC) Creatinine in 1.4 region, slightly higher than baseline Likely cardiorenal syndrome with signs of CHF Continue Lasix Continue to follow daily BMP  Obesity hypoventilation syndrome (HCC) On BIPAP early on but has transitioned back to home O2  HTN (hypertension) Stop amlodipine in the setting of Cardizem initiation, new-onset afib Continue to monitor blood pressure and adjust medications as needed.  Consultants: Cardiology   Procedures: Limited echo   Antibiotics: None  30 Day Unplanned Readmission Risk Score    Flowsheet Row ED to Hosp-Admission (Current) from 01/02/2023 in [facility] INTENSIVE CARE UNIT  30 Day Unplanned Readmission Risk Score (%) 28.87 Filed at 01/05/2023 0801       This score is the patient's risk of an unplanned readmission within 30 days of being discharged (0 -100%). The score is based on dignosis, age, lab data, medications, orders, and past utilization.   Low:  0-14.9   Medium: 15-21.9   High: 22-29.9   Extreme: 30 and above           Subjective: Patient seen and examined this morning.  She is sitting in the chair.  Continues to complain of shortness of breath, nonproductive cough.  Remains on O2 by nasal cannula.  Baseline oxygen is at 2  L at home.  Currently she is on 3+ liters.  She denies having any chest pain at this time.   Objective: Vitals:   01/06/23 0532 01/06/23 0748  BP: 100/77   Pulse: 86    Resp: 20   Temp:    SpO2:  (!) 89%    Intake/Output Summary (Last 24 hours) at 01/06/2023 1610 Last data filed at 01/06/2023 0017 Gross per 24 hour  Intake 478 ml  Output 1450 ml  Net -972 ml   Filed Weights   01/05/23 0500 01/05/23 0800 01/06/23 0500  Weight: 126.5 kg 126.5 kg 126.2 kg    Exam:  General:  Appears calm and comfortable and is in NAD, on [place] O2 Eyes:   EOMI, normal lids, iris ENT:  grossly normal hearing, lips & tongue Neck:  no LAD, masses or thyromegaly Cardiovascular:  Irregularly irregular, + systolic murmur, no r/g. Somewhat obscured due to habitus.  LE edema.  Respiratory:   CTA bilaterally with no rales/rhonchi, slight wheezing.  Normal respiratory effort. Abdomen:  soft, NT, ND Skin:  no rash or induration seen on limited exam Musculoskeletal:  grossly normal tone BUE/BLE, good ROM, no bony abnormality Psychiatric:  blunted mood and affect, speech dysarthric at baseline and appropriate, AOx3 Neurologic:  CN 2-12 grossly intact, moves all extremities in coordinated fashion  Data Reviewed: I have reviewed the patient's lab results since admission.  Pertinent labs for today include:   K+ 3.4 CO2 35 Glucose 108 BUN 46/Creatinine 1.4/GFR 43 Hgb 11.4 - stable TSH 1.878    Family Communication: None present  Disposition: Status is: Inpatient Remains inpatient appropriate because: still diuresing  Planned Discharge Destination: Home with Home Health DVT Prophylaxis Apixaban (eliquis) tablet 5 mg   Time spent: 50 min  Author: Vonzella Nipple, MD 01/06/2023 9:22 AM  For on call review [email or web address].

## 2023-01-06 NOTE — Progress Notes (Signed)
Physical Therapy Treatment Patient Details Name: Emma Morris MRN: [id number] DOB: September 04, 1960 Today's Date: 01/06/2023   History of Present Illness Emma Morris is a 62 y.o. female with medical history significant of anemia, obesity hypoventilation syndrome, hypertension, history of PE on Eliquis, and more presents the ED with a chief complaint of shortness of breath.  Patient came in via EMS from home with shortness of breath.  She wears 2 L nasal cannula at home but was satting 80% on that.  She was placed on 4 L nasal cannula and her O2 sats improved to 90%.  When she first arrived to the ED her O2 sats were in the 70s on 5 L nasal cannula.  She was placed on rebreather before ultimately being started on BiPAP.  Unfortunately, patient is not able to provide any history at this time due to acute illness.  She is somnolent and on the BiPAP.    PT Comments  Pt able to stand sit and walk without assistive device but becomes SOB needing O2 for functional mobility.      If plan is discharge home, recommend the following: A little help with walking and/or transfers;A little help with bathing/dressing/bathroom;Help with stairs or ramp for entrance;Assistance with cooking/housework   Can travel by private vehicle        Equipment Recommendations  None recommended by PT    Recommendations for Other Services       Precautions / Restrictions Precautions Precautions: None Restrictions Weight Bearing Restrictions: No     Mobility  Bed Mobility               General bed mobility comments: Pt in chair when therapist comes to treat.    Transfers Overall transfer level: Modified independent Equipment used: Rolling walker (2 wheels) Transfers: Sit to/from Stand Sit to Stand: Modified independent (Device/Increase time)           General transfer comment: Pt standing without AD    Ambulation/Gait Ambulation/Gait assistance: Modified independent (Device/Increase  time) Gait Distance (Feet): 10 Feet Assistive device: None Gait Pattern/deviations: Decreased step length - left, Decreased stance time - right   Gait velocity interpretation: <1.8 ft/sec, indicate of risk for recurrent falls   General Gait Details: Therapist stated she was going to get an O2 tank.  Pt states she has been going to the bathroom without O2.  Pt checked O2 after ambulating 10 ft; o2 70.  After one minute on O2 oxygen increased to 92.         Cognition Arousal: Alert Behavior During Therapy: WFL for tasks assessed/performed Overall Cognitive Status: Within Functional Limits for tasks assessed                                          Exercises General Exercises - Lower Extremity Ankle Circles/Pumps: Both, 10 reps Quad Sets: Both, 10 reps Long Arc Quad: Both, 10 reps Heel Slides: Both, 10 reps Hip ABduction/ADduction: Both, 10 reps Straight Leg Raises: Both, 5 reps (unable to clear chair) Hip Flexion/Marching: Seated, Both, 10 reps        Pertinent Vitals/Pain Pain Assessment Pain Assessment: No/denies pain    Home Living Family/patient expects to be discharged to:: Private residence Living Arrangements: Children Available Help at Discharge: Available PRN/intermittently;Family Type of Home: Apartment Home Access: Stairs to enter Entrance Stairs-Rails: None Entrance Stairs-Number of Steps: 4   Home  Layout: One level Home Equipment: Grab bars - tub/shower;Rolling Walker (2 wheels);Shower seat          PT Goals (current goals can now be found in the care plan section)      Frequency    Min 2X/week              AM-PAC PT "6 Clicks" Mobility   Outcome Measure  Help needed turning from your back to your side while in a flat bed without using bedrails?: A Little Help needed moving from lying on your back to sitting on the side of a flat bed without using bedrails?: A Little Help needed moving to and from a bed to a chair  (including a wheelchair)?: None Help needed standing up from a chair using your arms (e.g., wheelchair or bedside chair)?: None Help needed to walk in hospital room?: A Little Help needed climbing 3-5 steps with a railing? : A Lot 6 Click Score: 19    End of Session   Activity Tolerance: Patient tolerated treatment well;Other (comment) (O2 level) Patient left: with call bell/phone within reach;in chair Nurse Communication: Mobility status PT Visit Diagnosis: Muscle weakness (generalized) (M62.81);Other abnormalities of gait and mobility (R26.89)     Time: 1310-1341 PT Time Calculation (min) (ACUTE ONLY): 31 min  Charges:    $Gait Training: 8-22 mins $Therapeutic Exercise: 8-22 mins PT General Charges $$ ACUTE PT VISIT: 1 Visit                       Virgina Organ, PT CLT [phone]  01/06/2023, 1:42 PM

## 2023-01-06 NOTE — Evaluation (Signed)
Occupational Therapy Evaluation Patient Details Name: Emma Morris MRN: [id number] DOB: 02/21/1961 Today's Date: 01/06/2023   History of Present Illness Emma Morris is a 62 y.o. female with medical history significant of anemia, obesity hypoventilation syndrome, hypertension, history of PE on Eliquis, and more presents the ED with a chief complaint of shortness of breath.  Patient came in via EMS from home with shortness of breath.  She wears 2 L nasal cannula at home but was satting 80% on that.  She was placed on 4 L nasal cannula and her O2 sats improved to 90%.  When she first arrived to the ED her O2 sats were in the 70s on 5 L nasal cannula.  She was placed on rebreather before ultimately being started on BiPAP.  Unfortunately, patient is not able to provide any history at this time due to acute illness.  She is somnolent and on the BiPAP.   Clinical Impression   Pt agreeable to OT evaluation. Pt appears to be at or near baseline levels for functional ADL tasks. Pt able to ambulate in room with mod I level of assist. Pt reports assist for some lower body dressing at baseline. Pt donned 3 L supplemental O2 and was recorded at 92% SpO2 while standing and marching in place. Pt is not recommended for further acute OT services and will be discharged to care of nursing staff for remaining length of stay.        If plan is discharge home, recommend the following: A little help with bathing/dressing/bathroom;Assist for transportation;Help with stairs or ramp for entrance    Functional Status Assessment  Patient has not had a recent decline in their functional status        Precautions / Restrictions Precautions Precautions: None Restrictions Weight Bearing Restrictions: No      Mobility Bed Mobility               General bed mobility comments: Pt in chair at start of session.    Transfers Overall transfer level: Modified independent Equipment used: Rolling walker  (2 wheels)               General transfer comment: Sit to stand and a few steps away from chair and back without assist using RW.      Balance Overall balance assessment: Needs assistance         Standing balance support: Bilateral upper extremity supported, During functional activity Standing balance-Leahy Scale: Fair Standing balance comment: using RW                           ADL either performed or assessed with clinical judgement   ADL Overall ADL's : Needs assistance/impaired         Upper Body Bathing: Modified independent   Lower Body Bathing: Minimal assistance       Lower Body Dressing: Minimal assistance;Moderate assistance;Sitting/lateral leans   Toilet Transfer: Modified Independent;Rolling walker (2 wheels) Toilet Transfer Details (indicate cue type and reason): Simulated via sit to stand and ambulation in the room.                 Vision Baseline Vision/History: 1 Wears glasses Ability to See in Adequate Light: 1 Impaired Patient Visual Report: No change from baseline Vision Assessment?: No apparent visual deficits     Perception Perception: Not tested       Praxis Praxis: Not tested       Pertinent Vitals/Pain  Pain Assessment Pain Assessment: No/denies pain     Extremity/Trunk Assessment Upper Extremity Assessment Upper Extremity Assessment: Overall WFL for tasks assessed   Lower Extremity Assessment Lower Extremity Assessment: Defer to PT evaluation   Cervical / Trunk Assessment Cervical / Trunk Assessment: Normal   Communication Communication Communication: No apparent difficulties   Cognition Arousal: Alert Behavior During Therapy: WFL for tasks assessed/performed Overall Cognitive Status: Within Functional Limits for tasks assessed                                                        Home Living Family/patient expects to be discharged to:: Private residence Living  Arrangements: Children Available Help at Discharge: Available PRN/intermittently;Family Type of Home: Apartment Home Access: Stairs to enter [employer] of Steps: 4 Entrance Stairs-Rails: None Home Layout: One level     Bathroom Shower/Tub: Chief Strategy Officer: Standard Bathroom Accessibility: Yes   Home Equipment: Grab bars - tub/shower;Rolling Walker (2 wheels);Shower seat          Prior Functioning/Environment Prior Level of Function : Needs assist       Physical Assist : ADLs (physical)   ADLs (physical): Dressing Mobility Comments: Household ambulator w/ RW ADLs Comments: Previously reported some assist showing and assist with socks/shoes.         End of Session Equipment Utilized During Treatment: Rolling walker (2 wheels);Oxygen  Activity Tolerance: Patient tolerated treatment well Patient left: in chair;with call bell/phone within reach  OT Visit Diagnosis: Unsteadiness on feet (R26.81);Other abnormalities of gait and mobility (R26.89);Muscle weakness (generalized) (M62.81)                Time: 8657-8469 OT Time Calculation (min): 11 min Charges:  OT General Charges $OT Visit: 1 Visit OT Evaluation $OT Eval Low Complexity: 1 Low  Tylerjames Hoglund OT, MOT  Danie Chandler 01/06/2023, 2:39 PM

## 2023-01-06 NOTE — TOC Progression Note (Signed)
Transition of Care [facility]) - Progression Note    Patient Details  Name: Emma Morris MRN: [id number] Date of Birth: July 16, 1960  Transition of [facility]) CM/SW Contact  Villa Herb, [place] Phone Number: 01/06/2023, 12:30 PM  Clinical Narrative:    CSW spoke with pt who states she is agreeable to [facility] PT referral and she does not have an agency preference. CSW reached out to [facility] with [place], awaiting a response at this time on if they can accept. Pt states her address is [address] [place], [place]. Pt wears 2L O2 at home at baseline. TOC to follow.   Expected Discharge Plan: Home w Home Health Services Barriers to Discharge: Continued Medical Work up  Expected Discharge Plan and Services       Living arrangements for the past 2 months: Single Family Home                                       Social Determinants of Health (SDOH) Interventions SDOH Screenings   Food Insecurity: No Food Insecurity (01/02/2023)  Housing: Low Risk  (01/02/2023)  Transportation Needs: No Transportation Needs (01/02/2023)  Utilities: Not At Risk (01/02/2023)  Financial Resource Strain: Medium Risk (07/26/2022)   Received from [facility], [facility]  Social Connections: Unknown (04/06/2022)   Received from [facility], [facility]  Tobacco Use: Low Risk  (01/02/2023)    Readmission Risk Interventions    01/05/2023    1:17 PM 01/03/2023    1:30 PM 12/01/2022   10:47 AM  Readmission Risk Prevention Plan  Post Dischage Appt   Complete  Medication Screening   Complete  Transportation Screening Complete Complete Complete  PCP or Specialist Appt within 3-5 Days Not Complete Not Complete   HRI or Home Care Consult Complete    Social Work Consult for Recovery Care Planning/Counseling Complete    Palliative Care Screening Not Applicable Not Applicable   Medication Review Oceanographer) Complete Complete

## 2023-01-06 NOTE — Progress Notes (Addendum)
Progress Note  Patient Name: Emma Morris Date of Encounter: 01/06/2023  Primary Cardiologist: Nona Dell, MD  Subjective   Feeling much better. Breathing improved. Just ambulated from bathroom to chair to get washed up. Not yet back on telemetry but HR sounds ~120s. O2 tubing not presently on. Have asked staff to assist.  Inpatient Medications    Scheduled Meds:  apixaban  5 mg Oral BID   Chlorhexidine Gluconate Cloth  6 each Topical Q0600   diltiazem  30 mg Oral Q6H   furosemide  40 mg Intravenous BID   ipratropium-albuterol  3 mL Nebulization Q6H    PRN Meds: acetaminophen **OR** acetaminophen, albuterol, morphine injection, ondansetron **OR** ondansetron (ZOFRAN) IV, oxyCODONE   Vital Signs    Vitals:   01/06/23 0224 01/06/23 0500 01/06/23 0532 01/06/23 0748  BP:  (!) 99/47 100/77   Pulse:  87 86   Resp:  20 20   Temp:  98.4 F (36.9 C)    TempSrc:  Oral    SpO2: 91% 93%  (!) 89%  Weight:  126.2 kg    Height:        Intake/Output Summary (Last 24 hours) at 01/06/2023 0838 Last data filed at 01/06/2023 0017 Gross per 24 hour  Intake 478 ml  Output 1450 ml  Net -972 ml      01/06/2023    5:00 AM 01/05/2023    8:00 AM 01/05/2023    5:00 AM  Last 3 Weights  Weight (lbs) 278 lb 3.5 oz 278 lb 14.1 oz 278 lb 14.1 oz  Weight (kg) 126.2 kg 126.5 kg 126.5 kg     Telemetry    AF 80s-120s on telemetry prior to d/c for washup - Personally Reviewed  Physical Exam   GEN: No acute distress. Morbidly obese. Using neb. HEENT: Normocephalic, atraumatic, sclera non-icteric. Neck: No JVD or bruits. Cardiac: irregularly irregular, mildly tachycardic, no murmurs, rubs, or gallops.  Respiratory: Diffusely diminished bilaterally but improved air movement overall. Breathing is unlabored. GI: Soft, nontender, non-distended, BS +x 4. MS: no deformity. Extremities: No clubbing or cyanosis. Nonpitting trace BLE edema superimposed on large baseline leg habitus. Distal  pedal pulses are 2+ and equal bilaterally. Neuro:  AAOx3. Follows commands. Psych:  Responds to questions appropriately with a normal affect.  Labs    High Sensitivity Troponin:   Recent Labs  Lab 12/27/22 0027 12/27/22 0236 01/02/23 1523 01/02/23 1656  TROPONINIHS 5 5 40* 38*      Chemistry Recent Labs  Lab 01/03/23 0418 01/04/23 0457 01/05/23 0823 01/06/23 0404  NA 137 136 135 136  K 4.2 4.1 3.4* 3.6  CL 95* 92* 92* 96*  CO2 33* 36* 35* 34*  GLUCOSE 153* 118* 108* 84  BUN 28* 39* 46* 35*  CREATININE 1.18* 1.46* 1.40* 1.01*  CALCIUM 8.2* 8.7* 8.6* 8.6*  PROT 8.5*  --   --  7.4  ALBUMIN 2.6*  --   --  2.3*  AST 10*  --   --  10*  ALT 12  --   --  12  ALKPHOS 51  --   --  41  BILITOT 0.7  --   --  0.5  GFRNONAA 53* 41* 43* >60  ANIONGAP 9 8 8 6      Hematology Recent Labs  Lab 01/04/23 0457 01/05/23 0823 01/06/23 0404  WBC 14.3* 8.3 6.3  RBC 4.29 4.71 4.72  HGB 10.4* 11.4* 11.4*  HCT 36.3 39.5 39.3  MCV 84.6 83.9 83.3  MCH 24.2* 24.2* 24.2*  MCHC 28.7* 28.9* 29.0*  RDW 18.0* 18.0* 18.0*  PLT 389 435* 421*    BNP Recent Labs  Lab 01/02/23 1523  BNP 33.0     Radiology    ECHOCARDIOGRAM LIMITED  Result Date: 01/04/2023    ECHOCARDIOGRAM LIMITED REPORT   Patient Name:   ADILEIGH CASTENEDA Date of Exam: 01/04/2023 Medical Rec #:  [id number]          Height:       62.0 in Accession #:    [id number]         Weight:       278.9 lb Date of Birth:  1960-05-29         BSA:          2.202 m Patient Age:    62 years           BP:           94/61 mmHg Patient Gender: F                  HR:           90 bpm. [facility] Procedure: Limited Echo and Cardiac Doppler Indications:    Atrial Fibrillation I48.91  History:        Patient has prior history of Echocardiogram examinations, most                 recent 11/28/2022. Cardiomegaly; Risk Factors:Hypertension and                 Non-Smoker.  Sonographer:    Celesta Gentile RCS Referring Phys: [id number] JENNIFER  YATES IMPRESSIONS  1. Left ventricular ejection fraction, by estimation, is 65 to 70%. The left ventricle has normal function. The left ventricle has no regional wall motion abnormalities. There is mild concentric left ventricular hypertrophy. Left ventricular diastolic parameters are indeterminate. There is the interventricular septum is flattened in diastole ('D' shaped left ventricle), consistent with right ventricular volume overload.  2. Right ventricular systolic function is low normal. The right ventricular size is mildly enlarged. Tricuspid regurgitation signal is inadequate for assessing PA pressure.  3. A small pericardial effusion is present. The pericardial effusion is posterior to the left ventricle.  4. The aortic valve is tricuspid. There is mild calcification of the aortic valve. Aortic valve regurgitation is not visualized. Aortic valve sclerosis is present, with no evidence of aortic valve stenosis.  5. The inferior vena cava is dilated in size with <50% respiratory variability, suggesting right atrial pressure of 15 mmHg. Comparison(s): Prior images reviewed side by side. LVEF remains normal at 65-70%. Mild RV enlargement, small posterior pericardial effusion. FINDINGS  Left Ventricle: Left ventricular ejection fraction, by estimation, is 65 to 70%. The left ventricle has normal function. The left ventricle has no regional wall motion abnormalities. The left ventricular internal cavity size was normal in size. There is  mild concentric left ventricular hypertrophy. The interventricular septum is flattened in diastole ('D' shaped left ventricle), consistent with right ventricular volume overload. Left ventricular diastolic parameters are indeterminate. Left ventricular diastolic function could not be evaluated due to atrial fibrillation. Right Ventricle: The right ventricular size is mildly enlarged. No increase in right ventricular wall thickness. Right ventricular systolic function is low normal.  Tricuspid regurgitation signal is inadequate for assessing PA pressure. Left Atrium: Left atrial size was normal in size. Right Atrium: Right atrial size was normal in size. Pericardium: A small pericardial effusion  is present. The pericardial effusion is posterior to the left ventricle. Presence of epicardial fat layer. Aortic Valve: The aortic valve is tricuspid. There is mild calcification of the aortic valve. There is mild aortic valve annular calcification. Aortic valve regurgitation is not visualized. Aortic valve sclerosis is present, with no evidence of aortic valve stenosis. Aorta: The aortic root is normal in size and structure. Venous: The inferior vena cava is dilated in size with less than 50% respiratory variability, suggesting right atrial pressure of 15 mmHg. IAS/Shunts: No atrial level shunt detected by color flow Doppler. LEFT VENTRICLE PLAX 2D LVIDd:         3.60 cm LVIDs:         2.00 cm LV PW:         1.20 cm LV IVS:        1.30 cm LVOT diam:     1.80 cm LVOT Area:     2.54 cm  RIGHT VENTRICLE TAPSE (M-mode): 1.5 cm LEFT ATRIUM         Index       RIGHT ATRIUM           Index LA diam:    3.90 cm 1.77 cm/m  RA Area:     19.50 cm                                 RA Volume:   53.75 ml  24.41 ml/m   AORTA Ao Root diam: 3.10 cm MITRAL VALVE MV Area (PHT): 4.68 cm     SHUNTS MV Decel Time: 162 msec     Systemic Diam: 1.80 cm MV E velocity: 123.00 cm/s Nona Dell MD Electronically signed by Nona Dell MD Signature Date/Time: 01/04/2023/4:17:19 PM    Final     Cardiac Studies   Limited echo 01/04/23   1. Left ventricular ejection fraction, by estimation, is 65 to 70%. The  left ventricle has normal function. The left ventricle has no regional  wall motion abnormalities. There is mild concentric left ventricular  hypertrophy. Left ventricular diastolic  parameters are indeterminate. There is the interventricular septum is  flattened in diastole ('D' shaped left ventricle),  consistent with right  ventricular volume overload.   2. Right ventricular systolic function is low normal. The right  ventricular size is mildly enlarged. Tricuspid regurgitation signal is  inadequate for assessing PA pressure.   3. A small pericardial effusion is present. The pericardial effusion is  posterior to the left ventricle.   4. The aortic valve is tricuspid. There is mild calcification of the  aortic valve. Aortic valve regurgitation is not visualized. Aortic valve  sclerosis is present, with no evidence of aortic valve stenosis.   5. The inferior vena cava is dilated in size with <50% respiratory  variability, suggesting right atrial pressure of 15 mmHg.   Patient Profile     62 y.o. female with PE dx 11/2022, anemia, arthritis, HTN, morbid obesity with OHS, chronic respiratory failure on home O2 admitted with a/c hypoxic/hypercarbic respiratory failure, AKI, suspected a/c HFpEF, mildly elevated troponin, new onset AF. She had found her oxygen tubing was not completely connected, possibly exacerbating the problem.   Assessment & Plan    1. Acute on chronic respiratory failure with hypoxia and hypercapnia - requiring BiPAP on arrival, downgraded to HFNC - have asked staff to reassess O2 needs this AM - likely multifactorial - Covid negative  2. Acute on chronic HFpEF with suspected RV overload - prior RV reported as normal 11/2022 but this admission showing low normal RVSF, mildly enlarged RV, and D-shaped septum flattened in diastole c/w RV overload - suspect driven by multiple issues putting pressure on lungs - given ongoing OAC for PE, unclear whether this could be contributing as well - I/O's not incredibly impressive though BP prohibits aggressive measures; net - 1.8L thus far and Cr improving - given soft BPs, will discuss further diuresis with MD  (had been receiving IV Lasix)   3. Newly diagnosed persistent atrial fibrillation - in sinus tach in ED, then subsequently  afib now rate controlled on oral diltiazem - will continue short acting today given need for diuresis and low-normal BP at times - resumption of telemetry acutely pending to help guide rx - TSH OK - continue Eliquis - given acute exacerbation of respiratory disease, do not anticipate acute inpatient DCCV approach since rate control was able to be achieved, but need to reassess HR trends this AM to guide more definitive rec   4. Morbid obesity with OHS - would benefit from outpatient weight management program if not already enrolled, possible GLP1 - would beneift from OP sleep study and pulm f/u   5. Mildly elevated troponin (40->38) - do not suspect ACS at this time, suspect demand process   5. H/o PE 11/2022 - continue OAC   6. Anemia, thrombocytosis - per medicine team, overall stable today   7. AKI with hypoalbuminemia - improving   8. Small pericardial effusion - not clinically significant at this time, may be due to fluid overload    For questions or updates, please contact [facility] Please consult [email or web address] for contact info under Cardiology/STEMI.  Signed, Laurann Montana, PA-C 01/06/2023, 8:38 AM     Attending note:  Patient seen and examined.  Reviewed interval chart, agree with above assessment by Ms. Dunn PA-C.  Patient reports no sense of palpitations in atrial fibrillation.  She is afebrile, heart rate 80s to 90s at rest in atrial fibrillation, higher with activity.  Systolic blood pressure ranging 90s to 100s.  Lungs exhibit decreased breath sounds without wheezing.  Cardiac with irregularly irregular rhythm and no gallop.  Net output of approximately 700 cc last 24 hours.  Pertinent lab work includes potassium 3.6, BUN 35, creatinine 1.01 down from 1.40, hemoglobin 11.4, platelets 421.  Patient completed course of Lasix.  Would continue to track urine output for now.  Otherwise continue Eliquis, switching from short acting Cardizem to Cardizem CD 120  mg daily for now.  Jonelle Sidle, M.D., F.A.C.C.

## 2023-01-06 NOTE — Plan of Care (Signed)

## 2023-01-06 NOTE — Care Management Important Message (Signed)
Important Message  Patient Details  Name: Emma Morris MRN: [id number] Date of Birth: 03-23-1961   Medicare Important Message Given:  Yes     Corey Harold 01/06/2023, 4:33 PM

## 2023-01-07 ENCOUNTER — Other Ambulatory Visit: Payer: Self-pay

## 2023-01-07 DIAGNOSIS — I4819 Other persistent atrial fibrillation: Secondary | ICD-10-CM | POA: Diagnosis not present

## 2023-01-07 DIAGNOSIS — J9602 Acute respiratory failure with hypercapnia: Secondary | ICD-10-CM | POA: Diagnosis not present

## 2023-01-07 DIAGNOSIS — J9601 Acute respiratory failure with hypoxia: Secondary | ICD-10-CM | POA: Diagnosis not present

## 2023-01-07 LAB — CBC
HCT: 39.6 % (ref 36.0–46.0)
Hemoglobin: 11.4 g/dL — ABNORMAL LOW (ref 12.0–15.0)
MCH: 24.1 pg — ABNORMAL LOW (ref 26.0–34.0)
MCHC: 28.8 g/dL — ABNORMAL LOW (ref 30.0–36.0)
MCV: 83.5 fL (ref 80.0–100.0)
Platelets: 366 10*3/uL (ref 150–400)
RBC: 4.74 MIL/uL (ref 3.87–5.11)
RDW: 17.9 % — ABNORMAL HIGH (ref 11.5–15.5)
WBC: 7.1 10*3/uL (ref 4.0–10.5)
nRBC: 0 % (ref 0.0–0.2)

## 2023-01-07 LAB — BASIC METABOLIC PANEL
Anion gap: 4 — ABNORMAL LOW (ref 5–15)
BUN: 24 mg/dL — ABNORMAL HIGH (ref 8–23)
CO2: 37 mmol/L — ABNORMAL HIGH (ref 22–32)
Calcium: 8.4 mg/dL — ABNORMAL LOW (ref 8.9–10.3)
Chloride: 96 mmol/L — ABNORMAL LOW (ref 98–111)
Creatinine, Ser: 1.02 mg/dL — ABNORMAL HIGH (ref 0.44–1.00)
GFR, Estimated: >60 mL/min (ref >60)
Glucose, Bld: 88 mg/dL (ref 70–99)
Potassium: 3.4 mmol/L — ABNORMAL LOW (ref 3.5–5.1)
Sodium: 137 mmol/L (ref 135–145)

## 2023-01-07 MED ORDER — GUAIFENESIN-DM 100-10 MG/5ML PO SYRP
5.0000 mL | ORAL_SOLUTION | ORAL | Status: DC | PRN
  Administered 2023-01-07: 5 mL via ORAL
  Filled 2023-01-07: qty 5

## 2023-01-07 MED ORDER — POTASSIUM CHLORIDE CRYS ER 20 MEQ PO TBCR
40.0000 meq | EXTENDED_RELEASE_TABLET | Freq: Once | ORAL | Status: AC
  Administered 2023-01-07: 40 meq via ORAL
  Filled 2023-01-07: qty 2

## 2023-01-07 MED ORDER — FUROSEMIDE 10 MG/ML IJ SOLN
40.0000 mg | Freq: Two times a day (BID) | INTRAMUSCULAR | Status: DC
  Administered 2023-01-07 – 2023-01-08 (×2): 40 mg via INTRAVENOUS
  Filled 2023-01-07 (×2): qty 4

## 2023-01-07 NOTE — Progress Notes (Addendum)
Progress Note  Patient Name: Emma Morris Date of Encounter: 01/07/2023  Primary Cardiologist: Nona Dell, MD  Subjective   Feeling much better today. Breathing back to baseline. Converted to NSR HR 90s by telemetry, will get EKG to confirm. Overall can tell a better sense of well being.  Inpatient Medications    Scheduled Meds:  apixaban  5 mg Oral BID   diltiazem  120 mg Oral Daily   ipratropium-albuterol  3 mL Nebulization TID    PRN Meds: acetaminophen **OR** acetaminophen, albuterol, guaiFENesin-dextromethorphan, morphine injection, ondansetron **OR** ondansetron (ZOFRAN) IV, oxyCODONE   Vital Signs    Vitals:   01/07/23 0203 01/07/23 0500 01/07/23 0544 01/07/23 0819  BP: 113/78  102/75   Pulse: 97  90   Resp: 18  18   Temp: 98.2 F (36.8 C)  98.5 F (36.9 C)   TempSrc: Oral  Oral   SpO2: 92%  93% 94%  Weight:  125.6 kg    Height:        Intake/Output Summary (Last 24 hours) at 01/07/2023 0839 Last data filed at 01/06/2023 2121 Gross per 24 hour  Intake 600 ml  Output 500 ml  Net 100 ml      01/07/2023    5:00 AM 01/06/2023    5:00 AM 01/05/2023    8:00 AM  Last 3 Weights  Weight (lbs) 277 lb 278 lb 3.5 oz 278 lb 14.1 oz  Weight (kg) 125.646 kg 126.2 kg 126.5 kg     Telemetry    NSR - Personally Reviewed  ECG    Pending - Personally Reviewed  Physical Exam   GEN: No acute distress. Morbidly obese HEENT: Normocephalic, atraumatic, sclera non-icteric Neck: No JVD or bruits. Cardiac: RRR, no murmurs, rubs, or gallops.  Respiratory: Diffusely diminished bilaterally but improved air movement overall. Breathing is unlabored. GI: Soft, nontender, non-distended, BS +x 4. MS: no deformity. Extremities: No clubbing or cyanosis. Nonpitting trace BLE edema superimposed on large baseline leg habitus. Improved. Distal pedal pulses are 2+ and equal bilaterally. Neuro:  AAOx3. Follows commands. Psych:  Responds to questions appropriately with a  normal affect.  Labs    High Sensitivity Troponin:   Recent Labs  Lab 12/27/22 0027 12/27/22 0236 01/02/23 1523 01/02/23 1656  TROPONINIHS 5 5 40* 38*      Chemistry Recent Labs  Lab 01/03/23 0418 01/04/23 0457 01/05/23 0823 01/06/23 0404 01/07/23 0404  NA 137   < > 135 136 137  K 4.2   < > 3.4* 3.6 3.4*  CL 95*   < > 92* 96* 96*  CO2 33*   < > 35* 34* 37*  GLUCOSE 153*   < > 108* 84 88  BUN 28*   < > 46* 35* 24*  CREATININE 1.18*   < > 1.40* 1.01* 1.02*  CALCIUM 8.2*   < > 8.6* 8.6* 8.4*  PROT 8.5*  --   --  7.4  --   ALBUMIN 2.6*  --   --  2.3*  --   AST 10*  --   --  10*  --   ALT 12  --   --  12  --   ALKPHOS 51  --   --  41  --   BILITOT 0.7  --   --  0.5  --   GFRNONAA 53*   < > 43* >60 >60  ANIONGAP 9   < > 8 6 4*   < > = values in  this interval not displayed.     Hematology Recent Labs  Lab 01/05/23 0823 01/06/23 0404 01/07/23 0404  WBC 8.3 6.3 7.1  RBC 4.71 4.72 4.74  HGB 11.4* 11.4* 11.4*  HCT 39.5 39.3 39.6  MCV 83.9 83.3 83.5  MCH 24.2* 24.2* 24.1*  MCHC 28.9* 29.0* 28.8*  RDW 18.0* 18.0* 17.9*  PLT 435* 421* 366    BNP Recent Labs  Lab 01/02/23 1523  BNP 33.0     Radiology    No results found.  Cardiac Studies   Limited echo 01/04/23   1. Left ventricular ejection fraction, by estimation, is 65 to 70%. The  left ventricle has normal function. The left ventricle has no regional  wall motion abnormalities. There is mild concentric left ventricular  hypertrophy. Left ventricular diastolic  parameters are indeterminate. There is the interventricular septum is  flattened in diastole ('D' shaped left ventricle), consistent with right  ventricular volume overload.   2. Right ventricular systolic function is low normal. The right  ventricular size is mildly enlarged. Tricuspid regurgitation signal is  inadequate for assessing PA pressure.   3. A small pericardial effusion is present. The pericardial effusion is  posterior to the  left ventricle.   4. The aortic valve is tricuspid. There is mild calcification of the  aortic valve. Aortic valve regurgitation is not visualized. Aortic valve  sclerosis is present, with no evidence of aortic valve stenosis.   5. The inferior vena cava is dilated in size with <50% respiratory  variability, suggesting right atrial pressure of 15 mmHg.   Patient Profile     62 y.o. female with PE dx 11/2022, anemia, arthritis, HTN, morbid obesity with OHS, chronic respiratory failure on home O2 admitted with a/c hypoxic/hypercarbic respiratory failure, AKI, suspected a/c HFpEF, mildly elevated troponin, new onset AF. She had found her oxygen tubing was not completely connected, possibly exacerbating the problem.   Assessment & Plan    1. Acute on chronic respiratory failure with hypoxia and hypercapnia - requiring BiPAP on arrival, downgraded to HFNC and now back on [place] O2 - likely multifactorial, possibly worsened by oxygen tubing issue - Covid negative   2. Acute on chronic HFpEF with suspected RV overload - prior RV reported as normal 11/2022 but this admission showing low normal RVSF, mildly enlarged RV, and D-shaped septum flattened in diastole c/w RV overload - suspect driven by multiple issues putting pressure on lungs - given ongoing OAC for PE, unclear whether this could be contributing as well - improved with IV Lasix - do not feel she needs standing diuretic at this time given softer BP but will clarify with MD   3. Newly diagnosed persistent atrial fibrillation - in sinus tach in ED, then subsequently afib treated with rate control -> converted to NSR overnight by tele, will get EKG to confirm - continue diltiazem 120mg  daily - TSH OK - continue Eliquis   4. Morbid obesity with OHS - would benefit from outpatient weight management program if not already enrolled, possible GLP1 - would beneift from OP sleep study and pulm f/u   5. Mildly elevated troponin (40->38) - do not  suspect ACS at this time, suspect demand process   5. H/o PE 11/2022 - continue OAC   6. Anemia, thrombocytosis - per medicine team, overall stable today   7. AKI with hypoalbuminemia, hypokalemia - Cr stable at 1.02 today - will give KCl x1 otherwise defer lyte mgmt/albumin to primary team   8.  Small pericardial effusion - not clinically significant at this time, may be due to fluid overload  Have arranged cardiology f/u next available 9/10, would also encourage PCP f/u within 1 wk    For questions or updates, please contact  [facility] Please consult [email or web address] for contact info under Cardiology/STEMI.  Signed, Laurann Montana, PA-C 01/07/2023, 8:39 AM     Attending note:  Patient seen and examined.  Interval chart reviewed and case discussed with Ms. Jetta Lout, I agree with her above findings.  Ms. Nida Boatman has spontaneously converted to sinus rhythm.  Current regimen includes Cardizem CD 120 mg daily and she is already on Eliquis for prior treatment of pulmonary embolus diagnosed in July.  Afebrile, heart rate in the 90s in sinus rhythm by telemetry.  Systolic 100-115 range.  Lungs exhibit decreased breath sounds as before but no active wheezing.  Cardiac exam with RRR and no gallop.  Pertinent lab work includes potassium 3.4, BUN 24, creatinine 1.02, hemoglobin 11.4, platelets 366.  Recommend continuing Cardizem CD 120 mg daily and Eliquis.  Cardiology follow-up is arranged for September 10.  We will sign off for now.  Jonelle Sidle, M.D., F.A.C.C.

## 2023-01-07 NOTE — Progress Notes (Signed)
F/u EKG confirms NSR, NSSTW changes. No change to plan.

## 2023-01-07 NOTE — Progress Notes (Signed)
Physical Therapy Treatment Patient Details Name: Emma Morris MRN: [id number] DOB: 1960-11-23 Today's Date: 01/07/2023   History of Present Illness Emma Morris is a 62 y.o. female with medical history significant of anemia, obesity hypoventilation syndrome, hypertension, history of PE on Eliquis, and more presents the ED with a chief complaint of shortness of breath.  Patient came in via EMS from home with shortness of breath.  She wears 2 L nasal cannula at home but was satting 80% on that.  She was placed on 4 L nasal cannula and her O2 sats improved to 90%.  When she first arrived to the ED her O2 sats were in the 70s on 5 L nasal cannula.  She was placed on rebreather before ultimately being started on BiPAP.  Unfortunately, patient is not able to provide any history at this time due to acute illness.  She is somnolent and on the BiPAP.    PT Comments  Patient demonstrates increased endurance/distance for gait training with slightly labored movement, no loss of balance, SpO2 at 95% while on 2 LPM and tolerated sitting up in chair after therapy.  Patient will benefit from continued skilled physical therapy in hospital and recommended venue below to increase strength, balance, endurance for safe ADLs and gait.     If plan is discharge home, recommend the following: A little help with walking and/or transfers;A little help with bathing/dressing/bathroom;Help with stairs or ramp for entrance;Assistance with cooking/housework   Can travel by private vehicle        Equipment Recommendations  None recommended by PT    Recommendations for Other Services       Precautions / Restrictions Precautions Precautions: Fall Restrictions Weight Bearing Restrictions: No     Mobility  Bed Mobility Overal bed mobility: Needs Assistance Bed Mobility: Supine to Sit     Supine to sit: Modified independent (Device/Increase time), Supervision     General bed mobility comments: sllightly  labored movement, increased time    Transfers Overall transfer level: Needs assistance Equipment used: Rolling walker (2 wheels) Transfers: Sit to/from Stand, Bed to chair/wheelchair/BSC Sit to Stand: Modified independent (Device/Increase time)   Step pivot transfers: Supervision, Modified independent (Device/Increase time)       General transfer comment: increased time with slightly labored movement    Ambulation/Gait Ambulation/Gait assistance: Modified independent (Device/Increase time) Gait Distance (Feet): 100 Feet Assistive device: Rolling walker (2 wheels) Gait Pattern/deviations: Decreased step length - right, Decreased step length - left, Decreased stride length Gait velocity: decreased     General Gait Details: increased endurance/distance for gait training with slightly labored movement using RW without loss of balance, on 2 LPM O2 with SpO2 at 95%   Stairs             Wheelchair Mobility     Tilt Bed    Modified Rankin (Stroke Patients Only)       Balance Overall balance assessment: Needs assistance Sitting-balance support: Feet supported Sitting balance-Leahy Scale: Good Sitting balance - Comments: seated at EOB   Standing balance support: During functional activity, Bilateral upper extremity supported Standing balance-Leahy Scale: Fair Standing balance comment: fair/good using RW                            Cognition Arousal: Alert Behavior During Therapy: WFL for tasks assessed/performed Overall Cognitive Status: Within Functional Limits for tasks assessed  Exercises General Exercises - Lower Extremity Long Arc Quad: Seated, AROM, Strengthening, Both, 10 reps Hip Flexion/Marching: Seated, AROM, Strengthening, Both, 10 reps Toe Raises: Seated, AROM, Strengthening, Both, 10 reps Heel Raises: Seated, AROM, Strengthening, Both, 10 reps    General Comments         Pertinent Vitals/Pain Pain Assessment Pain Assessment: No/denies pain    Home Living                          Prior Function            PT Goals (current goals can now be found in the care plan section) Acute Rehab PT Goals Patient Stated Goal: return home PT Goal Formulation: With patient Time For Goal Achievement: 01/19/23 Potential to Achieve Goals: Good Progress towards PT goals: Progressing toward goals    Frequency    Min 3X/week      PT Plan      Co-evaluation              AM-PAC PT "6 Clicks" Mobility   Outcome Measure  Help needed turning from your back to your side while in a flat bed without using bedrails?: None Help needed moving from lying on your back to sitting on the side of a flat bed without using bedrails?: None Help needed moving to and from a bed to a chair (including a wheelchair)?: None Help needed standing up from a chair using your arms (e.g., wheelchair or bedside chair)?: None Help needed to walk in hospital room?: A Little Help needed climbing 3-5 steps with a railing? : A Little 6 Click Score: 22    End of Session Equipment Utilized During Treatment: Oxygen Activity Tolerance: Patient tolerated treatment well;Patient limited by fatigue Patient left: in chair;with call bell/phone within reach Nurse Communication: Mobility status PT Visit Diagnosis: Unsteadiness on feet (R26.81);Other abnormalities of gait and mobility (R26.89);Muscle weakness (generalized) (M62.81)     Time: 1610-9604 PT Time Calculation (min) (ACUTE ONLY): 31 min  Charges:    $Gait Training: 8-22 mins $Therapeutic Exercise: 8-22 mins PT General Charges $$ ACUTE PT VISIT: 1 Visit                     2:38 PM, 01/07/23 Ocie Bob, MPT Physical Therapist with [facility] [phone] office [phone] mobile phone

## 2023-01-07 NOTE — TOC Progression Note (Signed)
Transition of Care [facility]) - Progression Note    Patient Details  Name: Emma Morris MRN: [id number] Date of Birth: Dec 19, 1960  Transition of Care [facility]) CM/SW Contact  Villa Herb, [place] Phone Number: 01/07/2023, 9:40 AM  Clinical Narrative:    CSW updated by Kandee Keen with [facility] that they are unable to accept [facility] PT referral. CSW spoke to [place] with [facility] who states they can accept for services. Pt will need HH PT orders prior to D/C home. TOC to follow.   Expected Discharge Plan: Home w Home Health Services Barriers to Discharge: Continued Medical Work up  Expected Discharge Plan and Services       Living arrangements for the past 2 months: Single Family Home                                       Social Determinants of Health (SDOH) Interventions SDOH Screenings   Food Insecurity: No Food Insecurity (01/02/2023)  Housing: Low Risk  (01/02/2023)  Transportation Needs: No Transportation Needs (01/02/2023)  Utilities: Not At Risk (01/02/2023)  Financial Resource Strain: Medium Risk (07/26/2022)   Received from [facility], [facility]  Social Connections: Unknown (04/06/2022)   Received from [facility], [facility]  Tobacco Use: Low Risk  (01/02/2023)    Readmission Risk Interventions    01/05/2023    1:17 PM 01/03/2023    1:30 PM 12/01/2022   10:47 AM  Readmission Risk Prevention Plan  Post Dischage Appt   Complete  Medication Screening   Complete  Transportation Screening Complete Complete Complete  PCP or Specialist Appt within 3-5 Days Not Complete Not Complete   HRI or Home Care Consult Complete    Social Work Consult for Recovery Care Planning/Counseling Complete    Palliative Care Screening Not Applicable Not Applicable   Medication Review Oceanographer) Complete Complete

## 2023-01-07 NOTE — Progress Notes (Signed)
Progress Note   Patient: Emma Morris ZOX:[id number] DOB: [birth year] DOA: 01/02/2023     5 DOS: the patient was seen and examined on 01/07/2023   Brief hospital course: 62yo female with h/o anemia, OHS on 2L home O2, HTN, and PE on Eliquis who presented on 8/19 with SOB.  O2 sats 80% with EMS despite home O2, increased to 4L but still in 70s in ER so increased to 5L -> NRB -> BIPAP.  Multifactorial acute on chronic respiratory failure associated with CHF + restrictive/obstructive lung disease.  COVID negative.  CXR with ?edema.  Given Lasix, Solumedrol.  Elevated troponin, likely demand, Echo pending.  Found to have new-onset afib, already on Eliquis.  Transitioned off diltiazem drip to PO Cardizem.  Assessment and Plan: * Acute respiratory failure with hypoxia and hypercapnia (HCC) -Suspect due to acute on chronic diastolic dysfunction CHF flareup in the setting of new onset A-fib -Currently requiring 3 L of oxygen via nasal cannula -Prior to admission was on 2 L at home -Give additional IV Lasix/diuresis -Possible discharge home over the next 24 to 48 hours  Acute on chronic diastolic CHF (congestive heart failure) (HCC) -Patient with known h/o chronic diastolic CHF presenting with worsening SOB and hypoxia -CXR consistent with mild pulmonary edema -New onset afib likely contributing -Repeat limited echocardiogram showed no significant change in EF -Was given Lasix 60 mg x 1 in ER and will repeat with 40 mg IV BID -Mildly elevated HS troponin is likely related to demand ischemia; doubt ACS based on symptoms -Cardiology consulted and following.  Appreciate help.  Morbid obesity with BMI of 50.0-59.9, adult (HCC) -Body mass index is 51.01 kg/m..  -Weight loss should be encouraged -Outpatient PCP/bariatric medicine/bariatric surgery f/u encouraged  -Counseled on diet and lifestyle modifications.  New onset atrial fibrillation (HCC) New onset afib --Off Cardizem drip, -Appears  to have converted back to sinus -Continue oral Cardizem Already on Eliquis, will continue -Cardiology consult appreciated  History of pulmonary embolism Continue Eliquis  Elevated troponin Likely secondary to demand ischemia in the setting of acute on chronic respiratory failure with hypoxia and with acute hypercapnia in conjunction with new-onset afib Downtrending from 40>> 38 Cardiology consulted.  Less likely ACS.  AKI (acute kidney injury) (HCC) Creatinine in 1.4 region, slightly higher than baseline -Watch renal function closely with IV diuresis Continue to follow daily BMP  Obesity hypoventilation syndrome (HCC) -CPAP/BiPAP advised  HTN (hypertension) -Continue Cardizem due to aFib concerns,  amlodipine discontinued  Consultants: Cardiology   Procedures: Limited echo   Antibiotics: None   Subjective:  -No significant dyspnea at rest  -dyspnea on exertion persist   Objective: Vitals:   01/07/23 1316 01/07/23 1326  BP:  110/76  Pulse:  89  Resp:    Temp:  98.4 F (36.9 C)  SpO2: 98% 98%    Intake/Output Summary (Last 24 hours) at 01/07/2023 1834 Last data filed at 01/07/2023 1810 Gross per 24 hour  Intake 720 ml  Output 1400 ml  Net -680 ml   Filed Weights   01/05/23 0800 01/06/23 0500 01/07/23 0500  Weight: 126.5 kg 126.2 kg 125.6 kg    Exam:  General:  Appears calm and comfortable and is in NAD, on [place] O2 Eyes:   EOMI, normal lids, iris ENT:  grossly normal hearing, lips & tongue, [place] 3L/min Neck:  no LAD, masses or thyromegaly Cardiovascular:  Irregularly irregular, + systolic murmur, no r/g. Somewhat obscured due to habitus.  LE edema.  Respiratory:  CTA bilaterally with no rales/rhonchi, slight wheezing.  Normal respiratory effort. Abdomen:  soft, NT, ND Skin:  no rash or induration seen on limited exam Musculoskeletal:  grossly normal tone BUE/BLE, good ROM, no bony abnormality Psychiatric:  blunted mood and affect, speech dysarthric at  baseline and appropriate, AOx3 Neurologic:  CN 2-12 grossly intact, moves all extremities in coordinated fashion     Family Communication: None present  Disposition: Home in 1 to 2 days after diuresis Status is: Inpatient Remains inpatient appropriate because: still diuresing  Planned Discharge Destination: Home with Home Health DVT Prophylaxis Apixaban (eliquis) tablet 5 mg      Author: Shon Hale, MD 01/07/2023 6:34 PM  For on call review [email or web address].

## 2023-01-08 DIAGNOSIS — J9602 Acute respiratory failure with hypercapnia: Secondary | ICD-10-CM | POA: Diagnosis not present

## 2023-01-08 DIAGNOSIS — J9601 Acute respiratory failure with hypoxia: Secondary | ICD-10-CM | POA: Diagnosis not present

## 2023-01-08 LAB — BASIC METABOLIC PANEL
Anion gap: 6 (ref 5–15)
BUN: 16 mg/dL (ref 8–23)
CO2: 35 mmol/L — ABNORMAL HIGH (ref 22–32)
Calcium: 8.5 mg/dL — ABNORMAL LOW (ref 8.9–10.3)
Chloride: 96 mmol/L — ABNORMAL LOW (ref 98–111)
Creatinine, Ser: 0.82 mg/dL (ref 0.44–1.00)
GFR, Estimated: >60 mL/min (ref >60)
Glucose, Bld: 94 mg/dL (ref 70–99)
Potassium: 3.6 mmol/L (ref 3.5–5.1)
Sodium: 137 mmol/L (ref 135–145)

## 2023-01-08 MED ORDER — ALBUTEROL SULFATE HFA 108 (90 BASE) MCG/ACT IN AERS: 2.0000 | INHALATION_SPRAY | Freq: Four times a day (QID) | RESPIRATORY_TRACT | 2 refills | Status: DC | PRN

## 2023-01-08 MED ORDER — FUROSEMIDE 20 MG PO TABS: 20.0000 mg | ORAL_TABLET | Freq: Every day | ORAL | 11 refills | Status: DC

## 2023-01-08 MED ORDER — APIXABAN 5 MG PO TABS: 5.0000 mg | ORAL_TABLET | Freq: Two times a day (BID) | ORAL | 11 refills | Status: DC

## 2023-01-08 MED ORDER — POTASSIUM CHLORIDE ER 10 MEQ PO TBCR: 10.0000 meq | EXTENDED_RELEASE_TABLET | Freq: Every day | ORAL | 2 refills | Status: AC

## 2023-01-08 MED ORDER — DILTIAZEM HCL ER COATED BEADS 120 MG PO CP24: 120.0000 mg | ORAL_CAPSULE | Freq: Every day | ORAL | 5 refills | Status: DC

## 2023-01-08 NOTE — Discharge Instructions (Addendum)
1)You are taking Apixaban/Eliquis which is a blood thinner so please Avoid ibuprofen/Advil/Aleve/Motrin/Goody Powders/Naproxen/BC powders/Meloxicam/Diclofenac/Indomethacin and other Nonsteroidal anti-inflammatory medications as these will make you more likely to bleed and can cause stomach ulcers, can also cause Kidney problems.   2) please stop HCTZ/hydrochlorothiazide  3) please take Lasix/furosemide 20 mg daily  4) please note that there has been some changes to your medications  5) please follow-up with cardiologist as advised on 01/25/2023  6)Very low-salt diet advised--- less than 2 g of sodium per day advised  7)Weigh yourself daily, call if you gain more than 3 pounds in 1 day or more than 5 pounds in 1 week as your diuretic medications may need to be adjusted

## 2023-01-08 NOTE — TOC Transition Note (Signed)
Transition of Care [facility]) - CM/SW Discharge Note   Patient Details  Name: Emma Morris MRN: [id number] Date of Birth: 1960/11/10  Transition of Care (TOC) CM/SW Contact:  Catalina Gravel, LCSW Phone Number: 01/08/2023, 8:01 AM   Clinical Narrative:    CSW secure chat to MD that pt accepts HHPT and needs orders, orders entered. CSW ensured provider info added to AVS. Pt clear for DC today.  No further TOC need.    Final next level of care: Home w Home Health Services Barriers to Discharge: No Barriers Identified   Patient Goals and CMS Choice CMS Medicare.gov Compare Post Acute Care list provided to:: Patient    Discharge Placement                         Discharge Plan and Services Additional resources added to the After Visit Summary for                                       Social Determinants of Health (SDOH) Interventions SDOH Screenings   Food Insecurity: No Food Insecurity (01/02/2023)  Housing: Low Risk  (01/02/2023)  Transportation Needs: No Transportation Needs (01/02/2023)  Utilities: Not At Risk (01/02/2023)  Financial Resource Strain: Medium Risk (07/26/2022)   Received from [facility], [facility]  Social Connections: Unknown (04/06/2022)   Received from [facility], [facility]  Tobacco Use: Low Risk  (01/02/2023)     Readmission Risk Interventions    01/05/2023    1:17 PM 01/03/2023    1:30 PM 12/01/2022   10:47 AM  Readmission Risk Prevention Plan  Post Dischage Appt   Complete  Medication Screening   Complete  Transportation Screening Complete Complete Complete  PCP or Specialist Appt within 3-5 Days Not Complete Not Complete   HRI or Home Care Consult Complete    Social Work Consult for Recovery Care Planning/Counseling Complete    Palliative Care Screening Not Applicable Not Applicable   Medication Review Oceanographer) Complete Complete

## 2023-01-08 NOTE — Plan of Care (Signed)
  Problem: Coping: Goal: Level of anxiety will decrease Outcome: Progressing   Problem: Elimination: Goal: Will not experience complications related to bowel motility Outcome: Progressing Goal: Will not experience complications related to urinary retention Outcome: Progressing   

## 2023-01-08 NOTE — Discharge Summary (Signed)
Emma Morris, is a 62 y.o. female  DOB 07-21-1960  MRN [id number].  Admission date:  01/02/2023  Admitting Physician  Lilyan Gilford, DO  Discharge Date:  01/08/2023   Primary MD  Alvina Filbert, MD  Recommendations for primary care physician for things to follow:   1)You are taking Apixaban/Eliquis which is a blood thinner so please Avoid ibuprofen/Advil/Aleve/Motrin/Goody Powders/Naproxen/BC powders/Meloxicam/Diclofenac/Indomethacin and other Nonsteroidal anti-inflammatory medications as these will make you more likely to bleed and can cause stomach ulcers, can also cause Kidney problems.   2) please stop HCTZ/hydrochlorothiazide  3) please take Lasix/furosemide 20 mg daily  4) please note that there has been some changes to your medications  5) please follow-up with cardiologist as advised on 01/25/2023  6)Very low-salt diet advised--- less than 2 g of sodium per day advised  7)Weigh yourself daily, call if you gain more than 3 pounds in 1 day or more than 5 pounds in 1 week as your diuretic medications may need to be adjusted  Admission Diagnosis  Acute pulmonary edema (HCC) [J81.0] Elevated troponin [R79.89] COPD exacerbation (HCC) [J44.1] Acute respiratory failure with hypoxia and hypercapnia (HCC) [J96.01, J96.02] Acute on chronic respiratory failure with hypoxia and hypercapnia (HCC) [J96.21, J96.22]  Discharge Diagnosis  Acute pulmonary edema (HCC) [J81.0] Elevated troponin [R79.89] COPD exacerbation (HCC) [J44.1] Acute respiratory failure with hypoxia and hypercapnia (HCC) [J96.01, J96.02] Acute on chronic respiratory failure with hypoxia and hypercapnia (HCC) [J96.21, J96.22]    Principal Problem:   Acute respiratory failure with hypoxia and hypercapnia (HCC) Active Problems:   HTN (hypertension)   Obesity hypoventilation syndrome (HCC)   AKI (acute kidney injury) (HCC)    Elevated troponin   History of pulmonary embolism   New onset atrial fibrillation (HCC)   Morbid obesity with BMI of 50.0-59.9, adult (HCC)   Acute on chronic diastolic CHF (congestive heart failure) (HCC)     Past Medical History:  Diagnosis Date   Anemia    Arthritis    Asthma    Hypertension    Knee pain    Obesity hypoventilation syndrome (HCC)    Pulmonary embolus [facility])    Diagnosed July 2024   Respiratory failure with hypoxia [facility])    Past Surgical History:  Procedure Laterality Date   CESAREAN SECTION     COLONOSCOPY WITH PROPOFOL N/A 11/09/2021   Procedure: COLONOSCOPY WITH PROPOFOL;  Surgeon: Lanelle Bal, DO;  Location: [facility] ENDO [facility];  Service: Endoscopy;  Laterality: N/A;  11:00am   DILITATION & CURRETTAGE/HYSTROSCOPY WITH THERMACHOICE ABLATION N/A 05/04/2013   Procedure: DILATATION & CURETTAGE/HYSTEROSCOPY WITH THERMACHOICE ABLATION;  Surgeon: Lazaro Arms, MD;  Location: [facility] ORS;  Service: Gynecology;  Laterality: N/A;  temperature:87 degrees; total therapy time:14 minutes and 5 seconds   ESOPHAGOGASTRODUODENOSCOPY (EGD) WITH PROPOFOL N/A 11/09/2021   Procedure: ESOPHAGOGASTRODUODENOSCOPY (EGD) WITH PROPOFOL;  Surgeon: Lanelle Bal, DO;  Location: [facility];  Service: Endoscopy;  Laterality: N/A;   TONSILLECTOMY       HPI  from the history  and physical done on the day of admission:    HPI: Emma Morris is a 62 y.o. female with medical history significant of anemia, obesity hypoventilation syndrome, hypertension, history of PE on Eliquis, and more presents the ED with a chief complaint of shortness of breath.  Patient came in via EMS from home with shortness of breath.  She wears 2 L nasal cannula at home but was satting 80% on that.  She was placed on 4 L nasal cannula and her O2 sats improved to 90%.  When she first arrived to the ED her O2 sats were in the 70s on 5 L nasal cannula.  She was placed on rebreather before ultimately being started on BiPAP.   Unfortunately, patient is not able to provide any history at this time due to acute illness.  She is somnolent and on the BiPAP.   Patient has no ACP documents so she is full code by default. Review of Systems: unable to review all systems due to the inability of the patient to answer questions.   Hospital Course:     Assessment and Plan: 1)Acute on chronic respiratory failure with hypoxia and hypercapnia (HCC) -Suspect due to acute on chronic diastolic dysfunction CHF flareup in the setting of new onset A-fib -Prior to admission was on 2 L at home -With improved rate control and additional diuresis oxygen requirement is back to baseline   2)Acute on chronic diastolic CHF (congestive heart failure) (HCC) -She presented with worsening SOB and increased oxygen requirement -CXR was consistent with mild pulmonary edema -New onset afib likely contributing -Repeat limited echocardiogram showed no significant change in EF -Mildly elevated HS troponin is likely related to demand ischemia; doubt ACS based on symptoms -Diuresed well with IV Lasix -Cardiology input appreciated Discharge on low-dose Lasix with potassium supplementation   3)Morbid obesity with BMI of 50.0-59.9, adult (HCC) -Body mass index is 51.01 kg/m..  -Weight loss should be encouraged -Outpatient PCP/bariatric medicine/bariatric surgery f/u encouraged  -Counseled on diet and lifestyle modifications.   4)New onset atrial fibrillation (HCC) New onset afib -Appears to have converted back to sinus after treatment with IV Cardizem drip -Continue oral Cardizem Already on Eliquis (due to recent PE), will continue -Cardiology consult appreciated   History of pulmonary embolism----11/27/2022 Continue Eliquis   Elevated troponin Likely secondary to demand ischemia in the setting of acute on chronic respiratory failure with hypoxia and with acute hypercapnia in conjunction with new-onset afib Downtrending from 40>>  38 Cardiology consulted.  Less likely ACS.   AKI (acute kidney injury) (HCC) Creatinine in 1.46 on admission -Renal function has normalized   Obesity hypoventilation syndrome (HCC) -CPAP/BiPAP advised   HTN (hypertension) -Continue Cardizem due to aFib concerns,  amlodipine discontinued   Consultants: Cardiology   Procedures: Limited echo  Discharge Condition: stable  Follow UP   Follow-up Information     Dyann Kief, PA-C Follow up.   Specialty: Cardiology Why: [facility] - [facility] location in [facility] - cardiology follow-up arranged on Tuesday Jan 25, 2023 at 1:00 PM. Arrive 15 minutes prior to appointment time to check in. Elon Jester is one of the PAs with our team. Contact information: [address] [place] [address] [phone]         [facility] Follow up.   Why: They will contact you for HHPT appointment.                Consults obtained -cardiology  Diet and Activity  recommendation:  As advised  Discharge Instructions    Discharge Instructions     Call MD for:  difficulty breathing, headache or visual disturbances   Complete by: As directed    Call MD for:  persistant dizziness or light-headedness   Complete by: As directed    Call MD for:  persistant nausea and vomiting   Complete by: As directed    Call MD for:  temperature >100.4   Complete by: As directed    Diet - low sodium heart healthy   Complete by: As directed    Discharge instructions   Complete by: As directed    1)You are taking Apixaban/Eliquis which is a blood thinner so please Avoid ibuprofen/Advil/Aleve/Motrin/Goody Powders/Naproxen/BC powders/Meloxicam/Diclofenac/Indomethacin and other Nonsteroidal anti-inflammatory medications as these will make you more likely to bleed and can cause stomach ulcers, can also cause Kidney problems.   2) please stop HCTZ/hydrochlorothiazide  3) please take Lasix/furosemide 20 mg daily  4) please note  that there has been some changes to your medications  5) please follow-up with cardiologist as advised on 01/25/2023  6)Very low-salt diet advised--- less than 2 g of sodium per day advised  7)Weigh yourself daily, call if you gain more than 3 pounds in 1 day or more than 5 pounds in 1 week as your diuretic medications may need to be adjusted   Increase activity slowly   Complete by: As directed        Discharge Medications     Allergies as of 01/08/2023   No Known Allergies      Medication List     STOP taking these medications    amLODipine 10 MG tablet Commonly known as: NORVASC   hydrochlorothiazide 25 MG tablet Commonly known as: HYDRODIURIL   methylPREDNISolone 4 MG Tbpk tablet Commonly known as: MEDROL DOSEPAK   potassium chloride SA 20 MEQ tablet Commonly known as: KLOR-CON M       TAKE these medications    albuterol 108 (90 Base) MCG/ACT inhaler Commonly known as: VENTOLIN HFA Inhale 2 puffs into the lungs every 6 (six) hours as needed for wheezing or shortness of breath.   apixaban 5 MG Tabs tablet Commonly known as: ELIQUIS Take 1 tablet (5 mg total) by mouth 2 (two) times daily. What changed: See the new instructions.   cholecalciferol 25 MCG (1000 UNIT) tablet Commonly known as: VITAMIN D3 Take 1,000 Units by mouth daily.   diltiazem 120 MG 24 hr capsule Commonly known as: CARDIZEM CD Take 1 capsule (120 mg total) by mouth daily. Start taking on: January 09, 2023   fluticasone 50 MCG/ACT nasal spray Commonly known as: FLONASE Place into both nostrils.   furosemide 20 MG tablet Commonly known as: Lasix Take 1 tablet (20 mg total) by mouth daily. What changed:  medication strength how much to take   potassium chloride 10 MEQ tablet Commonly known as: KLOR-CON Take 1 tablet (10 mEq total) by mouth daily. Take While taking Lasix/furosemide       Major procedures and Radiology Reports - PLEASE review detailed and final reports for all  details, in brief -   ECHOCARDIOGRAM LIMITED  Result Date: 01/04/2023    ECHOCARDIOGRAM LIMITED REPORT   Patient Name:   ANNALEY FISHBAUGH Date of Exam: 01/04/2023 Medical Rec #:  [id number]          Height:       62.0 in Accession #:    [id number]  Weight:       278.9 lb Date of Birth:  [birth year]         BSA:          2.202 m Patient Age:    61 years           BP:           94/61 mmHg Patient Gender: F                  HR:           90 bpm. [facility] Procedure: Limited Echo and Cardiac Doppler Indications:    Atrial Fibrillation I48.91  History:        Patient has prior history of Echocardiogram examinations, most                 recent 11/28/2022. Cardiomegaly; Risk Factors:Hypertension and                 Non-Smoker.  Sonographer:    Celesta Gentile RCS Referring Phys: [id number] JENNIFER YATES IMPRESSIONS  1. Left ventricular ejection fraction, by estimation, is 65 to 70%. The left ventricle has normal function. The left ventricle has no regional wall motion abnormalities. There is mild concentric left ventricular hypertrophy. Left ventricular diastolic parameters are indeterminate. There is the interventricular septum is flattened in diastole ('D' shaped left ventricle), consistent with right ventricular volume overload.  2. Right ventricular systolic function is low normal. The right ventricular size is mildly enlarged. Tricuspid regurgitation signal is inadequate for assessing PA pressure.  3. A small pericardial effusion is present. The pericardial effusion is posterior to the left ventricle.  4. The aortic valve is tricuspid. There is mild calcification of the aortic valve. Aortic valve regurgitation is not visualized. Aortic valve sclerosis is present, with no evidence of aortic valve stenosis.  5. The inferior vena cava is dilated in size with <50% respiratory variability, suggesting right atrial pressure of 15 mmHg. Comparison(s): Prior images reviewed side by side. LVEF remains  normal at 65-70%. Mild RV enlargement, small posterior pericardial effusion. FINDINGS  Left Ventricle: Left ventricular ejection fraction, by estimation, is 65 to 70%. The left ventricle has normal function. The left ventricle has no regional wall motion abnormalities. The left ventricular internal cavity size was normal in size. There is  mild concentric left ventricular hypertrophy. The interventricular septum is flattened in diastole ('D' shaped left ventricle), consistent with right ventricular volume overload. Left ventricular diastolic parameters are indeterminate. Left ventricular diastolic function could not be evaluated due to atrial fibrillation. Right Ventricle: The right ventricular size is mildly enlarged. No increase in right ventricular wall thickness. Right ventricular systolic function is low normal. Tricuspid regurgitation signal is inadequate for assessing PA pressure. Left Atrium: Left atrial size was normal in size. Right Atrium: Right atrial size was normal in size. Pericardium: A small pericardial effusion is present. The pericardial effusion is posterior to the left ventricle. Presence of epicardial fat layer. Aortic Valve: The aortic valve is tricuspid. There is mild calcification of the aortic valve. There is mild aortic valve annular calcification. Aortic valve regurgitation is not visualized. Aortic valve sclerosis is present, with no evidence of aortic valve stenosis. Aorta: The aortic root is normal in size and structure. Venous: The inferior vena cava is dilated in size with less than 50% respiratory variability, suggesting right atrial pressure of 15 mmHg. IAS/Shunts: No atrial level shunt detected by color flow Doppler. LEFT VENTRICLE PLAX 2D LVIDd:  3.60 cm LVIDs:         2.00 cm LV PW:         1.20 cm LV IVS:        1.30 cm LVOT diam:     1.80 cm LVOT Area:     2.54 cm  RIGHT VENTRICLE TAPSE (M-mode): 1.5 cm LEFT ATRIUM         Index       RIGHT ATRIUM           Index LA  diam:    3.90 cm 1.77 cm/m  RA Area:     19.50 cm                                 RA Volume:   53.75 ml  24.41 ml/m   AORTA Ao Root diam: 3.10 cm MITRAL VALVE MV Area (PHT): 4.68 cm     SHUNTS MV Decel Time: 162 msec     Systemic Diam: 1.80 cm MV E velocity: 123.00 cm/s Nona Dell MD Electronically signed by Nona Dell MD Signature Date/Time: 01/04/2023/4:17:19 PM    Final    DG Chest Port 1 View  Result Date: 01/02/2023 CLINICAL DATA:  Shortness of breath EXAM: PORTABLE CHEST 1 VIEW COMPARISON:  12/26/2022 FINDINGS: Gross cardiomegaly. Diffuse bilateral interstitial pulmonary opacity. Small layering pleural effusions. The visualized skeletal structures are unremarkable. IMPRESSION: Gross cardiomegaly with diffuse bilateral interstitial pulmonary opacity and small layering pleural effusions, consistent with edema or atypical/viral infection. Electronically Signed   By: Jearld Lesch M.D.   On: 01/02/2023 15:22   DG Chest Port 1 View  Result Date: 12/27/2022 CLINICAL DATA:  Shortness of breath EXAM: PORTABLE CHEST 1 VIEW COMPARISON:  12/01/2022 FINDINGS: Cardiac shadow is enlarged but stable. Overall inspiratory effort is again poor. Some mild central vascular prominence is noted likely related to the poor inspiratory effort. No focal infiltrate or effusion is noted. No bony abnormality is seen. IMPRESSION: Poor inspiratory effort.  No acute abnormality noted. Electronically Signed   By: Alcide Clever M.D.   On: 12/27/2022 00:08    Micro Results   Recent Results (from the past 240 hour(s))  SARS Coronavirus 2 by RT PCR (hospital order, performed in [facility] hospital lab) *cepheid single result test* Anterior Nasal Swab     Status: None   Collection Time: 01/02/23  3:07 PM   Specimen: Anterior Nasal Swab  Result Value Ref Range Status   SARS Coronavirus 2 by RT PCR NEGATIVE NEGATIVE Final    Comment: (NOTE) SARS-CoV-2 target nucleic acids are NOT DETECTED.  The SARS-CoV-2 RNA is  generally detectable in upper and lower respiratory specimens during the acute phase of infection. The lowest concentration of SARS-CoV-2 viral copies this assay can detect is 250 copies / mL. A negative result does not preclude SARS-CoV-2 infection and should not be used as the sole basis for treatment or other patient management decisions.  A negative result may occur with improper specimen collection / handling, submission of specimen other than nasopharyngeal swab, presence of viral mutation(s) within the areas targeted by this assay, and inadequate number of viral copies (<250 copies / mL). A negative result must be combined with clinical observations, patient history, and epidemiological information.  Fact Sheet for Patients:   RoadLapTop.co.za  Fact Sheet for Healthcare Providers: http://kim-miller.com/  This test is not yet approved or  cleared by the [place] FDA and has  been authorized for detection and/or diagnosis of SARS-CoV-2 by FDA under an Emergency Use Authorization (EUA).  This EUA will remain in effect (meaning this test can be used) for the duration of the COVID-19 declaration under Section 564(b)(1) of the Act, 21 U.S.C. section 360bbb-3(b)(1), unless the authorization is terminated or revoked sooner.  Performed at [facility], [address], [place], [place] [address]   MRSA Next Gen by PCR, Nasal     Status: None   Collection Time: 01/02/23  9:31 PM   Specimen: Nasal Mucosa; Nasal Swab  Result Value Ref Range Status   MRSA by PCR Next Gen NOT DETECTED NOT DETECTED Final    Comment: (NOTE) The GeneXpert MRSA Assay (FDA approved for NASAL specimens only), is one component of a comprehensive MRSA colonization surveillance program. It is not intended to diagnose MRSA infection nor to guide or monitor treatment for MRSA infections. Test performance is not FDA approved in patients less than 28  years old. Performed at [facility], [address], [place], [place] [address]    Today   Subjective    Devora Vences today has no new complaints No fever  Or chills   No Nausea, Vomiting or Diarrhea -No dyspnea at rest -No significant dyspnea on exertion -Oxygen requirement is back to baseline of 2 L via nasal cannula         Patient has been seen and examined prior to discharge   Objective   Blood pressure 105/77, pulse (!) 102, temperature 98.3 F (36.8 C), temperature source Oral, resp. rate 18, height 5\' 2"  (1.575 m), weight 125.9 kg, SpO2 93%.   Intake/Output Summary (Last 24 hours) at 01/08/2023 1050 Last data filed at 01/08/2023 0608 Gross per 24 hour  Intake 480 ml  Output 2000 ml  Net -1520 ml   Exam Gen:- Awake Alert, no acute distress  HEENT:- [place].AT, No sclera icterus Nose- [place] 2L/min Neck-Supple Neck,No JVD,.  Lungs-  CTAB , good air movement bilaterally CV- S1, S2 normal, regular (back in sinus rhythm), 2/6 SM Abd-  +ve B.Sounds, Abd Soft, No tenderness,    Extremity/Skin:-Improved edema,   good pulses Psych-affect is appropriate, oriented x3 Neuro-no new focal deficits, no tremors    Data Review   CBC w Diff:  Lab Results  Component Value Date   WBC 7.1 01/07/2023   HGB 11.4 (L) 01/07/2023   HGB 12.0 09/15/2021   HCT 39.6 01/07/2023   HCT 38.3 09/15/2021   PLT 366 01/07/2023   PLT 305 09/15/2021   LYMPHOPCT 18 01/05/2023   MONOPCT 8 01/05/2023   EOSPCT 1 01/05/2023   BASOPCT 0 01/05/2023   CMP:  Lab Results  Component Value Date   NA 137 01/08/2023   NA 138 09/15/2021   K 3.6 01/08/2023   CL 96 (L) 01/08/2023   CO2 35 (H) 01/08/2023   BUN 16 01/08/2023   BUN 9 09/15/2021   CREATININE 0.82 01/08/2023   PROT 7.4 01/06/2023   PROT 9.4 (H) 09/15/2021   ALBUMIN 2.3 (L) 01/06/2023   ALBUMIN 3.9 09/15/2021   BILITOT 0.5 01/06/2023   BILITOT 0.4 09/15/2021   ALKPHOS 41 01/06/2023   AST 10 (L) 01/06/2023   ALT 12 01/06/2023    Total Discharge time is about 33 minutes  Shon Hale M.D on 01/08/2023 at 10:50 AM  Go to [email or web address] -  for contact info  [facility] - Office  [phone]

## 2023-01-09 ENCOUNTER — Other Ambulatory Visit: Payer: Self-pay | Admitting: Family Medicine

## 2023-01-09 DIAGNOSIS — I10 Essential (primary) hypertension: Secondary | ICD-10-CM

## 2023-01-09 MED ORDER — POTASSIUM CHLORIDE ER 10 MEQ PO TBCR: 10.0000 meq | EXTENDED_RELEASE_TABLET | Freq: Every day | ORAL | 2 refills | Status: DC

## 2023-01-09 MED ORDER — DILTIAZEM HCL ER COATED BEADS 120 MG PO CP24: 120.0000 mg | ORAL_CAPSULE | Freq: Every day | ORAL | 11 refills | Status: DC

## 2023-01-09 MED ORDER — FUROSEMIDE 20 MG PO TABS: 20.0000 mg | ORAL_TABLET | Freq: Every day | ORAL | 11 refills | Status: DC

## 2023-01-09 MED ORDER — APIXABAN 5 MG PO TABS: 5.0000 mg | ORAL_TABLET | Freq: Two times a day (BID) | ORAL | 5 refills | Status: DC

## 2023-01-09 NOTE — Progress Notes (Unsigned)
Pt's pharmacy at [facility] is closed for the weekend -so resent Rx to [facility] in [place] at pt's request  Shon Hale, MD

## 2023-01-11 NOTE — Progress Notes (Signed)
Cardiology Office Note:  .   Date:  01/25/2023  ID:  Adriana Reams, DOB 12-Jun-1960, MRN [id number] PCP: Alvina Filbert, MD  [facility] Providers Cardiologist:  Nona Dell, MD    History of Present Illness: .   Emma Morris is a 62 y.o. female  with PE dx 11/2022, anemia, arthritis, HTN, morbid obesity with OHS, chronic respiratory failure on home O2   Patient was admitted 12/2021 with a/c hypoxic/hypercarbic respiratory failure, AKI,  a/c HFpEF, mildly elevated troponin, new onset AF. She had found her oxygen tubing was not completely connected, possibly exacerbating the problem. Patient converted to NSR in hospital and echo normal LVEF 65-70%.  Patient comes in for f/u. O2 not working on arrival and BP and pulse up O2 sat down. Improved  when we put her on O2 here. Denies chest pain, palpitations,edema.   ROS:    Studies Reviewed: Marland Kitchen         Prior CV Studies:    Limited echo 01/04/23   1. Left ventricular ejection fraction, by estimation, is 65 to 70%. The  left ventricle has normal function. The left ventricle has no regional  wall motion abnormalities. There is mild concentric left ventricular  hypertrophy. Left ventricular diastolic  parameters are indeterminate. There is the interventricular septum is  flattened in diastole ('D' shaped left ventricle), consistent with right  ventricular volume overload.   2. Right ventricular systolic function is low normal. The right  ventricular size is mildly enlarged. Tricuspid regurgitation signal is  inadequate for assessing PA pressure.   3. A small pericardial effusion is present. The pericardial effusion is  posterior to the left ventricle.   4. The aortic valve is tricuspid. There is mild calcification of the  aortic valve. Aortic valve regurgitation is not visualized. Aortic valve  sclerosis is present, with no evidence of aortic valve stenosis.   5. The inferior vena cava is dilated in size with <50%  respiratory  variability, suggesting right atrial pressure of 15 mmHg.   Risk Assessment/Calculations:    CHA2DS2-VASc Score = 4   This indicates a 4.8% annual risk of stroke. The patient's score is based upon: CHF History: 1 HTN History: 1 Diabetes History: 0 Stroke History: 0 Vascular Disease History: 1 Age Score: 0 Gender Score: 1            Physical Exam:   VS:  BP 130/80   Pulse (!) 109 Comment: walking to tx area w/o her oxygen  Ht 5\' 2"  (1.575 m)   Wt 276 lb (125.2 kg)   SpO2 94%   BMI 50.48 kg/m    Wt Readings from Last 3 Encounters:  01/25/23 276 lb (125.2 kg)  01/08/23 277 lb 8 oz (125.9 kg)  11/30/22 296 lb 15.4 oz (134.7 kg)    GEN: Obese,  in no acute distress NECK: No JVD; No carotid bruits CARDIAC:  RRR, no murmurs, rubs, gallops RESPIRATORY:  Clear to auscultation without rales, wheezing or rhonchi  ABDOMEN: Soft, non-tender, non-distended EXTREMITIES:  No edema; No deformity   ASSESSMENT AND PLAN: .   Chronic HFpEF with suspected RV overload, weight stable no edema. Continue low dose lasix. 2 gm sodium diet.  Paroxysmal Afib converted in hospital on Eliquis and diltiazem. No palpitations. No bleeding problems. HR up over 100 when walking in here-O2 not working properly. Will increase diltiazem 180 mg daily  History of PE 11/2022 on OAC  HTN-BP up initially when off O2-came down.  Obesity-has  lost 20 lbs since July by stopping soda.            Dispo: f/u with Dr. Diona Browner 3-4 months  Signed, Jacolyn Reedy, PA-C

## 2023-01-25 ENCOUNTER — Ambulatory Visit: Payer: 59 | Attending: Physician Assistant | Admitting: Physician Assistant

## 2023-01-25 ENCOUNTER — Encounter: Payer: Self-pay | Admitting: Physician Assistant

## 2023-01-25 VITALS — BP 130/80 | HR 109 | Ht 62.0 in | Wt 276.0 lb

## 2023-01-25 DIAGNOSIS — E662 Morbid (severe) obesity with alveolar hypoventilation: Secondary | ICD-10-CM

## 2023-01-25 DIAGNOSIS — I10 Essential (primary) hypertension: Secondary | ICD-10-CM | POA: Diagnosis not present

## 2023-01-25 DIAGNOSIS — I5032 Chronic diastolic (congestive) heart failure: Secondary | ICD-10-CM | POA: Diagnosis not present

## 2023-01-25 DIAGNOSIS — I2699 Other pulmonary embolism without acute cor pulmonale: Secondary | ICD-10-CM | POA: Diagnosis not present

## 2023-01-25 DIAGNOSIS — I480 Paroxysmal atrial fibrillation: Secondary | ICD-10-CM | POA: Diagnosis not present

## 2023-01-25 MED ORDER — DILTIAZEM HCL ER COATED BEADS 180 MG PO CP24: 180.0000 mg | ORAL_CAPSULE | Freq: Every day | ORAL | 3 refills | Status: DC

## 2023-01-25 NOTE — Patient Instructions (Signed)
Medication Instructions:  INCREASE Diltiazem to 180 mg daily   Labwork: None today  Testing/Procedures: None today  Follow-Up: 3-4 months with Dr.McDowell   Any Other Special Instructions Will Be Listed Below (If Applicable).  If you need a refill on your cardiac medications before your next appointment, please call your pharmacy.   Two Gram Sodium Diet 2000 mg  What is Sodium? Sodium is a mineral found naturally in many foods. The most significant source of sodium in the diet is table salt, which is about 40% sodium.  Processed, convenience, and preserved foods also contain a large amount of sodium.  The body needs only 500 mg of sodium daily to function,  A normal diet provides more than enough sodium even if you do not use salt.  Why Limit Sodium? A build up of sodium in the body can cause thirst, increased blood pressure, shortness of breath, and water retention.  Decreasing sodium in the diet can reduce edema and risk of heart attack or stroke associated with high blood pressure.  Keep in mind that there are many other factors involved in these health problems.  Heredity, obesity, lack of exercise, cigarette smoking, stress and what you eat all play a role.  General Guidelines: Do not add salt at the table or in cooking.  One teaspoon of salt contains over 2 grams of sodium. Read food labels Avoid processed and convenience foods Ask your dietitian before eating any foods not dicussed in the menu planning guidelines Consult your physician if you wish to use a salt substitute or a sodium containing medication such as antacids.  Limit milk and milk products to 16 oz (2 cups) per day.  Shopping Hints: READ LABELS!! "Dietetic" does not necessarily mean low sodium. Salt and other sodium ingredients are often added to foods during processing.    Menu Planning Guidelines Food Group Choose More Often Avoid  Beverages (see also the milk group All fruit juices, low-sodium, salt-free  vegetables juices, low-sodium carbonated beverages Regular vegetable or tomato juices, commercially softened water used for drinking or cooking  Breads and Cereals Enriched white, wheat, rye and pumpernickel bread, hard rolls and dinner rolls; muffins, cornbread and waffles; most dry cereals, cooked cereal without added salt; unsalted crackers and breadsticks; low sodium or homemade bread crumbs Bread, rolls and crackers with salted tops; quick breads; instant hot cereals; pancakes; commercial bread stuffing; self-rising flower and biscuit mixes; regular bread crumbs or cracker crumbs  Desserts and Sweets Desserts and sweets mad with mild should be within allowance Instant pudding mixes and cake mixes  Fats Butter or margarine; vegetable oils; unsalted salad dressings, regular salad dressings limited to 1 Tbs; light, sour and heavy cream Regular salad dressings containing bacon fat, bacon bits, and salt pork; snack dips made with instant soup mixes or processed cheese; salted nuts  Fruits Most fresh, frozen and canned fruits Fruits processed with salt or sodium-containing ingredient (some dried fruits are processed with sodium sulfites        Vegetables Fresh, frozen vegetables and low- sodium canned vegetables Regular canned vegetables, sauerkraut, pickled vegetables, and others prepared in brine; frozen vegetables in sauces; vegetables seasoned with ham, bacon or salt pork  Condiments, Sauces, Miscellaneous  Salt substitute with physician's approval; pepper, herbs, spices; vinegar, lemon or lime juice; hot pepper sauce; garlic powder, onion powder, low sodium soy sauce (1 Tbs.); low sodium condiments (ketchup, chili sauce, mustard) in limited amounts (1 tsp.) fresh ground horseradish; unsalted tortilla chips, pretzels, potato chips, popcorn,  salsa (1/4 cup) Any seasoning made with salt including garlic salt, celery salt, onion salt, and seasoned salt; sea salt, rock salt, kosher salt; meat  tenderizers; monosodium glutamate; mustard, regular soy sauce, barbecue, sauce, chili sauce, teriyaki sauce, steak sauce, Worcestershire sauce, and most flavored vinegars; canned gravy and mixes; regular condiments; salted snack foods, olives, picles, relish, horseradish sauce, catsup   Food preparation: Try these seasonings Meats:    Pork Sage, onion Serve with applesauce  Chicken Poultry seasoning, thyme, parsley Serve with cranberry sauce  Lamb Curry powder, rosemary, garlic, thyme Serve with mint sauce or jelly  Veal Marjoram, basil Serve with current jelly, cranberry sauce  Beef Pepper, bay leaf Serve with dry mustard, unsalted chive butter  Fish Bay leaf, dill Serve with unsalted lemon butter, unsalted parsley butter  Vegetables:    Asparagus Lemon juice   Broccoli Lemon juice   Carrots Mustard dressing parsley, mint, nutmeg, glazed with unsalted butter and sugar   Green beans Marjoram, lemon juice, nutmeg,dill seed   Tomatoes Basil, marjoram, onion   Spice /blend for [employer]" 4 tsp ground thyme 1 tsp ground sage 3 tsp ground rosemary 4 tsp ground marjoram   Test your knowledge A product that says "Salt Free" may still contain sodium. True or False Garlic Powder and Hot Pepper Sauce an be used as alternative seasonings.True or False Processed foods have more sodium than fresh foods.  True or False Canned Vegetables have less sodium than froze True or False   WAYS TO DECREASE YOUR SODIUM INTAKE Avoid the use of added salt in cooking and at the table.  Table salt (and other prepared seasonings which contain salt) is probably one of the greatest sources of sodium in the diet.  Unsalted foods can gain flavor from the sweet, sour, and butter taste sensations of herbs and spices.  Instead of using salt for seasoning, try the following seasonings with the foods listed.  Remember: how you use them to enhance natural food flavors is limited only by your creativity... Allspice-Meat,  fish, eggs, fruit, peas, red and yellow vegetables Almond Extract-Fruit baked goods Anise Seed-Sweet breads, fruit, carrots, beets, cottage cheese, cookies (tastes like licorice) Basil-Meat, fish, eggs, vegetables, rice, vegetables salads, soups, sauces Bay Leaf-Meat, fish, stews, poultry Burnet-Salad, vegetables (cucumber-like flavor) Caraway Seed-Bread, cookies, cottage cheese, meat, vegetables, cheese, rice Cardamon-Baked goods, fruit, soups Celery Powder or seed-Salads, salad dressings, sauces, meatloaf, soup, bread.Do not use  celery salt Chervil-Meats, salads, fish, eggs, vegetables, cottage cheese (parsley-like flavor) Chili Power-Meatloaf, chicken cheese, corn, eggplant, egg dishes Chives-Salads cottage cheese, egg dishes, soups, vegetables, sauces Cilantro-Salsa, casseroles Cinnamon-Baked goods, fruit, pork, lamb, chicken, carrots Cloves-Fruit, baked goods, fish, pot roast, green beans, beets, carrots Coriander-Pastry, cookies, meat, salads, cheese (lemon-orange flavor) Cumin-Meatloaf, fish,cheese, eggs, cabbage,fruit pie (caraway flavor) [employer], fruit, eggs, fish, poultry, cottage cheese, vegetables Dill Seed-Meat, cottage cheese, poultry, vegetables, fish, salads, bread Fennel Seed-Bread, cookies, apples, pork, eggs, fish, beets, cabbage, cheese, Licorice-like flavor Garlic-(buds or powder) Salads, meat, poultry, fish, bread, butter, vegetables, potatoes.Do not  use garlic salt Ginger-Fruit, vegetables, baked goods, meat, fish, poultry Horseradish Root-Meet, vegetables, butter Lemon Juice or Extract-Vegetables, fruit, tea, baked goods, fish salads Mace-Baked goods fruit, vegetables, fish, poultry (taste like nutmeg) Maple Extract-Syrups Marjoram-Meat, chicken, fish, vegetables, breads, green salads (taste like Sage) Mint-Tea, lamb, sherbet, vegetables, desserts, carrots, cabbage Mustard, Dry or Seed-Cheese, eggs, meats, vegetables, poultry Nutmeg-Baked goods, fruit,  chicken, eggs, vegetables, desserts Onion Powder-Meat, fish, poultry, vegetables, cheese, eggs, bread, rice salads (Do  not use   Onion salt) Orange Extract-Desserts, baked goods Oregano-Pasta, eggs, cheese, onions, pork, lamb, fish, chicken, vegetables, green salads Paprika-Meat, fish, poultry, eggs, cheese, vegetables Parsley Flakes-Butter, vegetables, meat fish, poultry, eggs, bread, salads (certain forms may   Contain sodium Pepper-Meat fish, poultry, vegetables, eggs Peppermint Extract-Desserts, baked goods Poppy Seed-Eggs, bread, cheese, fruit dressings, baked goods, noodles, vegetables, cottage  [employer], poultry, meat, fish, cauliflower, turnips,eggs bread Saffron-Rice, bread, veal, chicken, fish, eggs Sage-Meat, fish, poultry, onions, eggplant, tomateos, pork, stews Savory-Eggs, salads, poultry, meat, rice, vegetables, soups, pork Tarragon-Meat, poultry, fish, eggs, butter, vegetables (licorice-like flavor)  Thyme-Meat, poultry, fish, eggs, vegetables, (clover-like flavor), sauces, soups Tumeric-Salads, butter, eggs, fish, rice, vegetables (saffron-like flavor) Vanilla Extract-Baked goods, candy Vinegar-Salads, vegetables, meat marinades Walnut Extract-baked goods, candy   2. Choose your Foods Wisely   The following is a list of foods to avoid which are high in sodium:  Meats-Avoid all smoked, canned, salt cured, dried and kosher meat and fish as well as Anchovies   [employer] meats:Bologna, Liverwurst, Pastrami Canned meat or fish  Marinated herring Caviar    Pepperoni Corned Beef   Pizza Dried chipped beef  Salami Frozen breaded fish or meat Salt pork Frankfurters or hot dogs  Sardines Gefilte fish   Sausage Ham (boiled ham, Proscuitto Smoked butt    spiced ham)   Spam      TV Dinners Vegetables Canned vegetables (Regular) Relish Canned mushrooms  Sauerkraut Olives    Tomato juice Pickles  Bakery and  Dessert Products Canned puddings  Cream pies Cheesecake   Decorated cakes Cookies  Beverages/Juices Tomato juice, regular  Gatorade   V-8 vegetable juice, regular  Breads and Cereals Biscuit mixes   Salted potato chips, corn chips, pretzels Bread stuffing mixes  Salted crackers and rolls Pancake and waffle mixes Self-rising flour  Seasonings Accent    Meat sauces Barbecue sauce  Meat tenderizer Catsup    Monosodium glutamate (MSG) Celery salt   Onion salt Chili sauce   Prepared mustard Garlic salt   Salt, seasoned salt, sea salt Gravy mixes   Soy sauce Horseradish   Steak sauce Ketchup   Tartar sauce Lite salt    Teriyaki sauce Marinade mixes   Worcestershire sauce  Others Baking powder   Cocoa and cocoa mixes Baking soda   Commercial casserole mixes Candy-caramels, chocolate  Dehydrated soups    Bars, fudge,nougats  Instant rice and pasta mixes Canned broth or soup  Maraschino cherries Cheese, aged and processed cheese and cheese spreads  Learning Assessment Quiz  Indicated T (for True) or F (for False) for each of the following statements:  _____ Fresh fruits and vegetables and unprocessed grains are generally low in sodium _____ Water may contain a considerable amount of sodium, depending on the source _____ You can always tell if a food is high in sodium by tasting it _____ Certain laxatives my be high in sodium and should be avoided unless prescribed   by a physician or pharmacist _____ Salt substitutes may be used freely by anyone on a sodium restricted diet _____ Sodium is present in table salt, food additives and as a natural component of   most foods _____ Table salt is approximately 90% sodium _____ Limiting sodium intake may help prevent excess fluid accumulation in the body _____ On a sodium-restricted diet, seasonings such as bouillon soy sauce, and    cooking wine should be used in place of table salt _____ On  an ingredient list, a product which lists  monosodium glutamate as the first   ingredient is an appropriate food to include on a low sodium diet  Circle the best answer(s) to the following statements (Hint: there may be more than one correct answer)  11. On a low-sodium diet, some acceptable snack items are:    A. Olives  F. Bean dip   K. Grapefruit juice    B. Salted Pretzels G. Commercial Popcorn   L. Canned peaches    C. Carrot Sticks  H. Bouillon   M. Unsalted nuts   D. [place] fries  I. Peanut butter crackers N. Salami   E. Sweet pickles J. Tomato Juice   O. Pizza  12.  Seasonings that may be used freely on a reduced - sodium diet include   A. Lemon wedges F.Monosodium glutamate K. Celery seed    B.Soysauce   G. Pepper   L. Mustard powder   C. Sea salt  H. Cooking wine  M. Onion flakes   D. Vinegar  E. Prepared horseradish N. Salsa   E. Sage   J. Worcestershire sauce  O. Chutney

## 2023-01-27 ENCOUNTER — Encounter: Payer: Self-pay | Admitting: *Deleted

## 2023-02-01 ENCOUNTER — Other Ambulatory Visit (HOSPITAL_COMMUNITY): Payer: Self-pay | Admitting: Internal Medicine

## 2023-02-01 DIAGNOSIS — N6310 Unspecified lump in the right breast, unspecified quadrant: Secondary | ICD-10-CM

## 2023-03-15 ENCOUNTER — Ambulatory Visit (HOSPITAL_COMMUNITY)
Admission: RE | Admit: 2023-03-15 | Discharge: 2023-03-15 | Disposition: A | Discharge status: Home / Self Care | Payer: 59 | Source: Ambulatory Visit | Attending: Family Medicine | Admitting: Family Medicine

## 2023-03-15 ENCOUNTER — Encounter (HOSPITAL_COMMUNITY): Payer: Self-pay

## 2023-03-15 DIAGNOSIS — N6310 Unspecified lump in the right breast, unspecified quadrant: Secondary | ICD-10-CM

## 2023-03-15 DIAGNOSIS — N6341 Unspecified lump in right breast, subareolar: Secondary | ICD-10-CM | POA: Diagnosis not present

## 2023-04-07 ENCOUNTER — Ambulatory Visit: Payer: 59 | Admitting: Internal Medicine

## 2023-04-08 ENCOUNTER — Ambulatory Visit: Payer: 59 | Attending: Cardiology | Admitting: Cardiology

## 2023-04-08 ENCOUNTER — Encounter: Payer: Self-pay | Admitting: Cardiology

## 2023-04-12 ENCOUNTER — Ambulatory Visit (INDEPENDENT_AMBULATORY_CARE_PROVIDER_SITE_OTHER): Payer: 59 | Admitting: Orthopedic Surgery

## 2023-04-12 ENCOUNTER — Encounter: Payer: Self-pay | Admitting: Orthopedic Surgery

## 2023-04-12 VITALS — Ht 62.0 in | Wt 273.0 lb

## 2023-04-12 DIAGNOSIS — M1711 Unilateral primary osteoarthritis, right knee: Secondary | ICD-10-CM

## 2023-04-12 DIAGNOSIS — M170 Bilateral primary osteoarthritis of knee: Secondary | ICD-10-CM

## 2023-04-12 DIAGNOSIS — M1712 Unilateral primary osteoarthritis, left knee: Secondary | ICD-10-CM

## 2023-04-12 MED ORDER — TRAMADOL HCL 50 MG PO TABS
50.0000 mg | ORAL_TABLET | Freq: Four times a day (QID) | ORAL | 0 refills | Status: DC | PRN
Start: 2023-04-12 — End: 2023-05-26

## 2023-04-12 NOTE — Patient Instructions (Signed)

## 2023-04-12 NOTE — Progress Notes (Signed)
Orthopaedic Clinic Return  Assessment: EULAMAE KUNCE is a 62 y.o. female with the following: Bilateral knee arthritis  Plan: Ms. Nida Boatman has severe bilateral knee arthritis.  Injections have been successful in the past, although with the most recent injections did not last as long.  Nonetheless, she would like to continue with injections today.  She is also requesting pain medicine.  I provided her with some tramadol.  She will return to clinic as needed.  Procedure note injection Right knee joint   Verbal consent was obtained to inject the right knee joint  Timeout was completed to confirm the site of injection.  The skin was prepped with alcohol and ethyl chloride was sprayed at the injection site.  A 21-gauge needle was used to inject 40 mg of Depo-Medrol and 1% lidocaine (3 cc) into the right knee using an anterolateral approach.  There were no complications. A sterile bandage was applied.   Procedure note injection Left knee joint   Verbal consent was obtained to inject the left knee joint  Timeout was completed to confirm the site of injection.  The skin was prepped with alcohol and ethyl chloride was sprayed at the injection site.  A 21-gauge needle was used to inject 40 mg of Depo-Medrol and 1% lidocaine (3 cc) into the left knee using an anterolateral approach.  There were no complications. A sterile bandage was applied.      Body mass index is 49.93 kg/m.   The patient meets the AMA guidelines for Morbid obesity with BMI > 40.  The patient has been counseled on weight loss.    Follow-up: No follow-ups on file.   Subjective:  Chief Complaint  Patient presents with   Knee Pain    Bil knee pain real bad the last 2 weeks  this week is the worse   NDC: [phone]    History of Present Illness: SHAKERIA OPIE is a 62 y.o. female who presents for repeat evaluation of her bilateral knees.  I saw her last, approximately 6 months ago.  At that time, we  injected bilateral knees.  Injections were helpful, but not as long as previous injections.  She wants pain medication.  She continues to use a rolling walker. Review of Systems: No fevers or chills No numbness or tingling No chest pain No shortness of breath No bowel or bladder dysfunction No GI distress No headaches  Objective: Ht 5\' 2"  (1.575 m)   Wt 273 lb (123.8 kg)   BMI 49.93 kg/m   Physical Exam:  Obese female.  No acute distress.  Alert and oriented.   Bilateral knees, diffuse anterior pain.  Tenderness along medial and lateral joint line.  Crepitus with ROM.  Varus alignment.  No increased laxity to varus or valgus stress.   IMAGING: I personally ordered and reviewed the following images:  No new imaging obtained today.   Oliver Barre, MD 04/12/2023 1:27 PM

## 2023-04-20 ENCOUNTER — Ambulatory Visit: Payer: 59 | Admitting: Internal Medicine

## 2023-05-05 ENCOUNTER — Encounter: Payer: Self-pay | Admitting: Internal Medicine

## 2023-05-05 ENCOUNTER — Ambulatory Visit: Payer: 59 | Admitting: Internal Medicine

## 2023-05-26 ENCOUNTER — Other Ambulatory Visit: Payer: Self-pay | Admitting: Orthopedic Surgery

## 2023-09-05 ENCOUNTER — Telehealth: Payer: Self-pay | Admitting: Orthopedic Surgery

## 2023-09-05 NOTE — Telephone Encounter (Signed)
 Patient lvm on 09/02/23 at 3:39pm, we were closed.  Returned her called this morning, lvm for her to call me back.

## 2023-09-08 ENCOUNTER — Other Ambulatory Visit: Payer: Self-pay | Admitting: Orthopedic Surgery

## 2023-09-08 NOTE — Telephone Encounter (Signed)
 Dr. Ernesta Heading pt - spoke w/the pt, pt is requesting a refill for Tramadol  50mg  to be sent to [facility]

## 2023-09-10 MED ORDER — TRAMADOL HCL 50 MG PO TABS: 50.0000 mg | ORAL_TABLET | Freq: Four times a day (QID) | ORAL | 0 refills | Status: DC | PRN

## 2023-09-14 ENCOUNTER — Encounter: Payer: Self-pay | Admitting: Orthopedic Surgery

## 2023-09-14 ENCOUNTER — Ambulatory Visit (INDEPENDENT_AMBULATORY_CARE_PROVIDER_SITE_OTHER): Admitting: Orthopedic Surgery

## 2023-09-14 DIAGNOSIS — M1711 Unilateral primary osteoarthritis, right knee: Secondary | ICD-10-CM

## 2023-09-14 DIAGNOSIS — M170 Bilateral primary osteoarthritis of knee: Secondary | ICD-10-CM

## 2023-09-14 DIAGNOSIS — M1712 Unilateral primary osteoarthritis, left knee: Secondary | ICD-10-CM

## 2023-09-14 MED ORDER — METHYLPREDNISOLONE ACETATE 40 MG/ML IJ SUSP
40.0000 mg | Freq: Once | INTRAMUSCULAR | Status: AC
  Administered 2023-09-14: 40 mg via INTRA_ARTICULAR

## 2023-09-14 NOTE — Progress Notes (Signed)
 Orthopaedic Clinic Return  Assessment: [name] is a 63 y.o. female with the following: Bilateral knee arthritis  Plan: Ms. [name] has severe bilateral knee arthritis.  She is not eligible to consider total knee arthroplasty.  Injections take the edge off the pain.  She would like to proceed with injection today.  I provided her a refill for tramadol  a few days ago.  Procedure note injection Right knee joint   Verbal consent was obtained to inject the right knee joint  Timeout was completed to confirm the site of injection.  The skin was prepped with alcohol and ethyl chloride was sprayed at the injection site.  A 21-gauge needle was used to inject 40 mg of Depo-Medrol  and 1% lidocaine  (3 cc) into the right knee using an anterolateral approach.  There were no complications. A sterile bandage was applied.   Procedure note injection Left knee joint   Verbal consent was obtained to inject the left knee joint  Timeout was completed to confirm the site of injection.  The skin was prepped with alcohol and ethyl chloride was sprayed at the injection site.  A 21-gauge needle was used to inject 40 mg of Depo-Medrol  and 1% lidocaine  (3 cc) into the left knee using an anterolateral approach.  There were no complications. A sterile bandage was applied.      There is no height or weight on file to calculate BMI.   The patient meets the AMA guidelines for Morbid obesity with BMI > 40.  The patient has been counseled on weight loss.    Follow-up: Return if symptoms worsen or fail to improve.   Subjective:  Chief Complaint  Patient presents with   Injections    Both knees     History of Present Illness: Emma Morris is a 63 y.o. female who presents for repeat evaluation of her bilateral knees.  I have seen her several times in the past.  She has severe knee arthritis.  She is not a candidate for surgery, based on her BMI.  She continues to take tramadol  for pain.  She  is interested in injections today.  She is also asked for refill on tramadol .   Review of Systems: No fevers or chills No numbness or tingling No chest pain No shortness of breath No bowel or bladder dysfunction No GI distress No headaches  Objective: There were no vitals taken for this visit.  Physical Exam:  Obese female.  No acute distress.  Alert and oriented.   Bilateral knees, diffuse anterior pain.  Tenderness along medial and lateral joint line.  Crepitus with ROM.  Varus alignment.  No increased laxity to varus or valgus stress.   IMAGING: I personally ordered and reviewed the following images:  No new imaging obtained today.   Tonita Frater, MD 09/14/2023 11:38 AM

## 2023-09-14 NOTE — Patient Instructions (Signed)

## 2023-10-20 ENCOUNTER — Ambulatory Visit: Admitting: Gastroenterology

## 2023-10-26 ENCOUNTER — Other Ambulatory Visit: Payer: Self-pay | Admitting: Orthopedic Surgery

## 2023-11-25 ENCOUNTER — Telehealth: Payer: Self-pay | Admitting: Cardiology

## 2023-11-25 NOTE — Telephone Encounter (Signed)
 Patient is placed on wait list and I also advised her to weigh self daily and record and avoid pizza.

## 2023-11-25 NOTE — Telephone Encounter (Signed)
 We last saw this patient 01/25/23 and she failed to follow up.  She now reports for the past two months she has noticed increased swelling her legs. She states she weighs herself one or two times a week and today's weight is 290 lbs-our last documented weight is 276 lbs at office visit on 01/25/24  I inquired about her sodium intake and she tells me she likes to have pizza from [employer] weekly.  I confirmed her medications with [facility] and they confirmed: Lasix  20 mg every day, Potassium 10 meq daily, diltiazem  180 mg daily  They have no recent refills for eliquis  or spironolactone.  She has scheduled follow up on 01/24/24.

## 2023-11-25 NOTE — Telephone Encounter (Signed)
 Pt c/o swelling/edema: STAT if pt has developed SOB within 24 hours  If swelling, where is the swelling located? At top of both legs  How much weight have you gained and in what time span?   Yes  Have you gained 2 pounds in a day or 5 pounds in a week?   5 pounds in a week  Do you have a log of your daily weights (if so, list)?   Yes  7/6 - 282 pounds 7/7 - 285 pounds 7/11 - 290 pounds  Are you currently taking a fluid pill?   Yes  Are you currently SOB?   No  Have you traveled recently in a car or plane for an extended period of time?   No  Patient is concerned she has been having swelling at the top of her legs.

## 2023-12-06 ENCOUNTER — Ambulatory Visit: Admitting: Orthopedic Surgery

## 2023-12-06 DIAGNOSIS — M170 Bilateral primary osteoarthritis of knee: Secondary | ICD-10-CM

## 2023-12-06 DIAGNOSIS — M1711 Unilateral primary osteoarthritis, right knee: Secondary | ICD-10-CM

## 2023-12-06 DIAGNOSIS — M1712 Unilateral primary osteoarthritis, left knee: Secondary | ICD-10-CM

## 2023-12-06 MED ORDER — TRAMADOL HCL 50 MG PO TABS: 50.0000 mg | ORAL_TABLET | Freq: Four times a day (QID) | ORAL | 0 refills | Status: DC | PRN

## 2023-12-06 NOTE — Patient Instructions (Signed)

## 2023-12-06 NOTE — Progress Notes (Signed)
 Orthopaedic Clinic Return  Assessment: Emma Morris is a 63 y.o. female with the following: Bilateral knee arthritis  Plan: Ms. Randy has severe bilateral knee arthritis.  She is not eligible to consider total knee arthroplasty.  Injections remain helpful.  She is interested in repeat injections today.  I provided her with a refill for tramadol .  Procedure note injection Right knee joint   Verbal consent was obtained to inject the right knee joint  Timeout was completed to confirm the site of injection.  The skin was prepped with alcohol and ethyl chloride was sprayed at the injection site.  A 21-gauge needle was used to inject 40 mg of Depo-Medrol  and 1% lidocaine  (3 cc) into the right knee using an anterolateral approach.  There were no complications. A sterile bandage was applied.   Procedure note injection Left knee joint   Verbal consent was obtained to inject the left knee joint  Timeout was completed to confirm the site of injection.  The skin was prepped with alcohol and ethyl chloride was sprayed at the injection site.  A 21-gauge needle was used to inject 40 mg of Depo-Medrol  and 1% lidocaine  (3 cc) into the left knee using an anterolateral approach.  There were no complications. A sterile bandage was applied.     The patient meets the AMA guidelines for Morbid obesity with BMI > 40.  The patient has been counseled on weight loss.    Follow-up: Return if symptoms worsen or fail to improve.   Subjective:  Chief Complaint  Patient presents with   Injections    Bilat knees    History of Present Illness: Emma Morris is a 63 y.o. female who presents for repeat evaluation of her bilateral knees.  I have seen her several times in the past.  Is known arthritis in bilateral knees.  Based on her BMI, she is not a candidate for surgery.  She has been taking tramadol  for pain.  She needs a refill.  She is interested in injections today.  Review of  Systems: No fevers or chills No numbness or tingling No chest pain No shortness of breath No bowel or bladder dysfunction No GI distress No headachestra  Objective: There were no vitals taken for this visit.  Physical Exam:  Obese female.  No acute distress.  Alert and oriented.   Bilateral knees, diffuse anterior pain.  Tenderness along medial and lateral joint line.  Crepitus with ROM.  Varus alignment.  No increased laxity to varus or valgus stress.   IMAGING: I personally ordered and reviewed the following images:  No new imaging obtained today.   Oneil DELENA Horde, MD 12/06/2023 1:51 PM

## 2023-12-13 ENCOUNTER — Ambulatory Visit: Admitting: Cardiology

## 2024-01-24 ENCOUNTER — Ambulatory Visit: Admitting: Cardiology

## 2024-02-13 NOTE — Telephone Encounter (Signed)
 Patient called requesting a call back from clinic.  Called patient back.  Patient states [facility] is needing a referral for her sleep study.  Informed patient that clinic will fax over referral for sleep study.  Referral for Sleep study faxed to [facility] at [phone].  Fax confirmation received.

## 2024-02-15 ENCOUNTER — Ambulatory Visit: Admitting: Cardiology

## 2024-02-15 ENCOUNTER — Encounter: Payer: Self-pay | Admitting: Cardiology

## 2024-02-15 ENCOUNTER — Ambulatory Visit: Attending: Cardiology | Admitting: Cardiology

## 2024-02-15 VITALS — BP 142/88 | HR 99 | Ht 62.0 in | Wt 300.2 lb

## 2024-02-15 DIAGNOSIS — E662 Morbid (severe) obesity with alveolar hypoventilation: Secondary | ICD-10-CM

## 2024-02-15 DIAGNOSIS — I5032 Chronic diastolic (congestive) heart failure: Secondary | ICD-10-CM | POA: Diagnosis not present

## 2024-02-15 DIAGNOSIS — Z86711 Personal history of pulmonary embolism: Secondary | ICD-10-CM

## 2024-02-15 DIAGNOSIS — I480 Paroxysmal atrial fibrillation: Secondary | ICD-10-CM

## 2024-02-15 MED ORDER — EMPAGLIFLOZIN 10 MG PO TABS: 10.0000 mg | ORAL_TABLET | Freq: Every day | ORAL | 11 refills | Status: DC

## 2024-02-15 MED ORDER — EMPAGLIFLOZIN 10 MG PO TABS: 10.0000 mg | ORAL_TABLET | Freq: Every day | ORAL | 0 refills | Status: DC

## 2024-02-15 NOTE — Progress Notes (Unsigned)
 New Patient Pulmonology Office Visit   Subjective:  Patient ID: Emma Morris, female    DOB: 08-03-1960  MRN: [id number]  Referred by: Katrinka Aquas, MD  CC: No chief complaint on file.   HPI Emma Morris is a 63 y.o. female with hx of PE, HFpEF, and suspected OHS who presents for initial evaluation of the latter.    {PULM QUESTIONNAIRES (Optional):33196}  ROS  Allergies: Patient has no known allergies.  Current Outpatient Medications:    albuterol  (VENTOLIN  HFA) 108 (90 Base) MCG/ACT inhaler, Inhale 2 puffs into the lungs every 6 (six) hours as needed for wheezing or shortness of breath., Disp: 18 g, Rfl: 2   apixaban  (ELIQUIS ) 5 MG TABS tablet, Take 1 tablet (5 mg total) by mouth 2 (two) times daily., Disp: 60 tablet, Rfl: 5   cholecalciferol (VITAMIN D3) 25 MCG (1000 UNIT) tablet, Take 1,000 Units by mouth daily., Disp: , Rfl:    diltiazem  (CARDIZEM  CD) 180 MG 24 hr capsule, Take 1 capsule (180 mg total) by mouth daily., Disp: 90 capsule, Rfl: 3   empagliflozin (JARDIANCE) 10 MG TABS tablet, Take 1 tablet (10 mg total) by mouth daily before breakfast., Disp: 30 tablet, Rfl: 11   empagliflozin (JARDIANCE) 10 MG TABS tablet, Take 1 tablet (10 mg total) by mouth daily before breakfast., Disp: 14 tablet, Rfl: 0   fluticasone (FLONASE) 50 MCG/ACT nasal spray, Place into both nostrils., Disp: , Rfl:    furosemide  (LASIX ) 20 MG tablet, Take 1 tablet (20 mg total) by mouth daily., Disp: 30 tablet, Rfl: 11   potassium chloride  (KLOR-CON ) 10 MEQ tablet, Take 1 tablet (10 mEq total) by mouth daily. Take While taking Lasix /furosemide , Disp: 30 tablet, Rfl: 2   Semaglutide,0.25 or 0.5MG /DOS, (OZEMPIC, 0.25 OR 0.5 MG/DOSE,) 2 MG/1.5ML SOPN, Inject 0.25 mg into the skin. (Patient not taking: Reported on 02/15/2024), Disp: , Rfl:    spironolactone (ALDACTONE) 25 MG tablet, Take 25 mg by mouth 2 (two) times daily., Disp: , Rfl:    traMADol  (ULTRAM ) 50 MG tablet, Take 1 tablet (50 mg  total) by mouth every 6 (six) hours as needed., Disp: 30 tablet, Rfl: 0 Past Medical History:  Diagnosis Date   Anemia    Arthritis    Asthma    Hypertension    Knee pain    Obesity hypoventilation syndrome [facility])    Pulmonary embolus [facility])    Diagnosed July 2024   Respiratory failure with hypoxia [facility])    Past Surgical History:  Procedure Laterality Date   CESAREAN SECTION     COLONOSCOPY WITH PROPOFOL  N/A 11/09/2021   Procedure: COLONOSCOPY WITH PROPOFOL ;  Surgeon: Cindie Carlin POUR, DO;  Location: [facility];  Service: Endoscopy;  Laterality: N/A;  11:00am   DILITATION & CURRETTAGE/HYSTROSCOPY WITH THERMACHOICE ABLATION N/A 05/04/2013   Procedure: DILATATION & CURETTAGE/HYSTEROSCOPY WITH THERMACHOICE ABLATION;  Surgeon: Vonn VEAR Inch, MD;  Location: [facility] ORS;  Service: Gynecology;  Laterality: N/A;  temperature:87 degrees; total therapy time:14 minutes and 5 seconds   ESOPHAGOGASTRODUODENOSCOPY (EGD) WITH PROPOFOL  N/A 11/09/2021   Procedure: ESOPHAGOGASTRODUODENOSCOPY (EGD) WITH PROPOFOL ;  Surgeon: Cindie Carlin POUR, DO;  Location: [facility];  Service: Endoscopy;  Laterality: N/A;   TONSILLECTOMY     Family History  Problem Relation Age of Onset   Cancer Mother    Kidney disease Father    Diabetes Father    Colon cancer Neg Hx    Social History   Socioeconomic History   Marital status:  Married    Spouse name: Not on file   Number of children: Not on file   Years of education: Not on file   Highest education level: Not on file  Occupational History   Not on file  Tobacco Use   Smoking status: Never   Smokeless tobacco: Never  Vaping Use   Vaping status: Never Used  Substance and Sexual Activity   Alcohol use: No   Drug use: No   Sexual activity: Yes  Other Topics Concern   Not on file  Social History Narrative   Not on file   Social Drivers of Health   Financial Resource Strain: Medium Risk (11/25/2023)   Received from [facility]   Overall Financial  Resource Strain (CARDIA)    Difficulty of Paying Living Expenses: Somewhat hard  Food Insecurity: Food Insecurity Present (12/05/2023)   Received from [facility]   Hunger Vital Sign    Within the past 12 months, you worried that your food would run out before you got the money to buy more.: Sometimes true    Within the past 12 months, the food you bought just didn't last and you didn't have money to get more.: Sometimes true  Transportation Needs: No Transportation Needs (12/05/2023)   Received from [facility]   PRAPARE - Transportation    Lack of Transportation (Medical): No    Lack of Transportation (Non-Medical): No  Physical Activity: Not on file  Stress: Not on file  Social Connections: Unknown (04/06/2022)   Received from [facility]   Social Network    Social Network: Not on file  Intimate Partner Violence: Not At Risk (01/02/2023)   Humiliation, Afraid, Rape, and Kick questionnaire    Fear of Current or Ex-Partner: No    Emotionally Abused: No    Physically Abused: No    Sexually Abused: No       Objective:  There were no vitals taken for this visit. {Pulm Vitals (Optional):32837}  Physical Exam  Diagnostic Review:  {Labs (Optional):32838} Bicarb in the 30s PaCO2 in the 60s - 70s  CTA Chest 11/2022 Intraluminal filling defects are seen in segmental branches in right upper lobe suggesting PE with small thrombus burden    TTE 11/2022- LV wnl, grade 1 dd- RV wnl- IVC dilated and noncollapsible- RVCP 30   TTE 12/2022 1. Left ventricular ejection fraction, by estimation, is 65 to 70%. The left ventricle has normal function. The left ventricle has no regional wall motion abnormalities. There is mild concentric left ventricular hypertrophy. Left ventricular diastolic parameters are indeterminate. There is the interventricular septum is flattened in diastole ('D' shaped left ventricle), consistent with right ventricular volume overload.  2. Right ventricular systolic  function is low normal. The right ventricular size is mildly enlarged. Tricuspid regurgitation signal is inadequate for assessing PA pressure.  3. A small pericardial effusion is present. The pericardial effusion is posterior to the left ventricle.  4. The aortic valve is tricuspid. There is mild calcification of the aortic valve. Aortic valve regurgitation is not visualized. Aortic valve sclerosis is present, with no evidence of aortic valve stenosis.  5. The inferior vena cava is dilated in size with <50% respiratory variability, suggesting right atrial pressure of 15 mmHg.   Echo 06/2023: Summary   1. The left ventricle is normal in size with upper normal wall thickness.    2. The left ventricular systolic function is normal, LVEF is visually  estimated at 60-65%.    3.  Mitral annular calcification is present (mild).    4. The right ventricle is normal in size, with normal systolic function.    5. There is no pulmonary hypertension.     Assessment & Plan:   Assessment & Plan   No orders of the defined types were placed in this encounter.     No follow-ups on file.   Dontai Pember, MD

## 2024-02-15 NOTE — Progress Notes (Signed)
 Cardiology Office Note  Date: 02/15/2024   ID: Emma Morris, DOB 1960-09-30, MRN [id number]  History of Present Illness: Emma Morris is a 63 y.o. female last seen in September 2024 by Ms. Parthenia RIGGERS, I reviewed her note.  I had seen her prior to that as an inpatient consult.  Interval records reviewed.  She is following with Pulmonary through [facility], PCP in [place].  Not on oxygen at this time, but it looks like there were plans for sleep testing and evaluation for BiPAP per Pulmonary.  She had a follow-up echocardiogram earlier this year at [facility] showing LVEF 60 to 65% with normal RV contraction and no evidence of pulmonary hypertension.  She reports intermittent swelling in her legs and thighs.  Currently taking Aldactone 25 mg twice daily, Lasix  20 mg daily, and potassium supplement.  She apparently did not get insurance coverage for Ozempic.  She continues on Eliquis  5 mg twice daily, reports no bleeding problems.  I reviewed her most recent lab work.  I reviewed her ECG today which shows sinus rhythm with nonspecific T wave changes.  Physical Exam: VS:  BP (!) 142/88 (BP Location: Left Arm, Cuff Size: Large) Comment: has not taken morning meds  Pulse 99   Ht 5' 2 (1.575 m)   Wt (!) 300 lb 3.2 oz (136.2 kg)   SpO2 93%   BMI 54.91 kg/m , BMI Body mass index is 54.91 kg/m.  Wt Readings from Last 3 Encounters:  02/15/24 (!) 300 lb 3.2 oz (136.2 kg)  04/12/23 273 lb (123.8 kg)  01/25/23 276 lb (125.2 kg)    General: Patient appears comfortable at rest. HEENT: Conjunctiva and lids normal. Neck: Supple, no elevated JVP or carotid bruits. Lungs: Clear to auscultation, nonlabored breathing at rest. Cardiac: Regular rate and rhythm, no S3 or significant systolic murmur. Extremities: Increased adipose tissue as well as edema/lymphedema.  ECG:  An ECG dated 01/07/2023 was personally reviewed today and demonstrated:  Sinus rhythm with poor R wave  progression.  Labwork:  January 2025: Potassium 4.3, BUN 13, creatinine 0.66, GFR greater than 90 July 2025: Hemoglobin 11.4, platelets 239  Other Studies Reviewed Today:  Echocardiogram 07/15/2023 [facility]): Summary   1. The left ventricle is normal in size with upper normal wall thickness.    2. The left ventricular systolic function is normal, LVEF is visually  estimated at 60-65%.    3. Mitral annular calcification is present (mild).    4. The right ventricle is normal in size, with normal systolic function.    5. There is no pulmonary hypertension.   Assessment and Plan:  1.  Paroxysmal atrial fibrillation with CHA2DS2-VASc score of 3.  Documented during hospitalization the last year.  She does not report any palpitations and continues on Cardizem  CD 180 mg daily as well as Eliquis  5 mg twice daily.  She does not report any spontaneous bleeding problems.  She is in sinus rhythm by ECG today.  2.  HFpEF.  LVEF 60 to 65% and RV contraction normal by echocardiogram through [facility] in February.  Plan to start Jardiance 10 mg daily and otherwise continue Aldactone 25 mg twice daily and Lasix  20 mg daily with potassium supplement.  3.  Morbid obesity with OHS.  Not on supplemental oxygen at this time.  Keep follow-up with Pulmonary at [facility].  4.  History of pulmonary embolus in July 2024.  Disposition:  Follow  up 3 to 4 months.  Signed, Emma Morris, M.D., F.A.C.C. [facility]

## 2024-02-15 NOTE — Patient Instructions (Addendum)
 Medication Instructions:  START Jardiance 10 mg daily  Labwork: None today  Testing/Procedures: None today  Follow-Up: 3-4 months  Any Other Special Instructions Will Be Listed Below (If Applicable).  If you need a refill on your cardiac medications before your next appointment, please call your pharmacy.

## 2024-02-16 ENCOUNTER — Ambulatory Visit: Admitting: Pulmonary Disease

## 2024-02-16 ENCOUNTER — Encounter: Payer: Self-pay | Admitting: Pulmonary Disease

## 2024-02-16 VITALS — BP 152/94 | HR 97 | Ht 62.0 in | Wt 296.8 lb

## 2024-02-16 DIAGNOSIS — J9612 Chronic respiratory failure with hypercapnia: Secondary | ICD-10-CM

## 2024-02-16 DIAGNOSIS — G4730 Sleep apnea, unspecified: Secondary | ICD-10-CM | POA: Diagnosis not present

## 2024-02-16 DIAGNOSIS — Z86711 Personal history of pulmonary embolism: Secondary | ICD-10-CM | POA: Diagnosis not present

## 2024-02-16 DIAGNOSIS — I5032 Chronic diastolic (congestive) heart failure: Secondary | ICD-10-CM

## 2024-02-16 NOTE — Patient Instructions (Signed)
-   Be consistent. Go to bed at the same time each night and get up at the same time each morning, including on the weekends - Make sure your bedroom is quiet, dark, relaxing, and at a comfortable temperature - Remove electronic devices, such as TVs, computers, and smart phones, from the bedroom - Avoid large meals, caffeine, and alcohol before bedtime - Get some exercise. Being physically active during the day can help you fall asleep more easily at night. - Get sleep study done soon - Come back after its done

## 2024-02-16 NOTE — Assessment & Plan Note (Signed)
 Continue Eliquis  5 mg BID. Patient has a. Fib with CHADS2VASC of 3. Likely to stay on long term anticoagulation.

## 2024-03-09 ENCOUNTER — Telehealth: Payer: Self-pay

## 2024-03-09 NOTE — Telephone Encounter (Signed)
 Patient left vm stating she wanted to schedule an appointment for an injection. I called her back and left her a message to call the office so we can get her scheduled.

## 2024-03-14 ENCOUNTER — Ambulatory Visit: Admitting: Orthopedic Surgery

## 2024-03-23 ENCOUNTER — Telehealth: Payer: Self-pay

## 2024-03-23 NOTE — Telephone Encounter (Signed)
 Patient left vm msg wanting to set up an appointment. I called her back and had to leave a message for her to call the office

## 2024-03-28 ENCOUNTER — Encounter: Payer: Self-pay | Admitting: Orthopedic Surgery

## 2024-03-28 ENCOUNTER — Ambulatory Visit (INDEPENDENT_AMBULATORY_CARE_PROVIDER_SITE_OTHER): Admitting: Orthopedic Surgery

## 2024-03-28 DIAGNOSIS — M170 Bilateral primary osteoarthritis of knee: Secondary | ICD-10-CM | POA: Diagnosis not present

## 2024-03-28 DIAGNOSIS — M1712 Unilateral primary osteoarthritis, left knee: Secondary | ICD-10-CM

## 2024-03-28 DIAGNOSIS — M1711 Unilateral primary osteoarthritis, right knee: Secondary | ICD-10-CM

## 2024-03-28 MED ORDER — TRAMADOL HCL 50 MG PO TABS: 50.0000 mg | ORAL_TABLET | Freq: Four times a day (QID) | ORAL | 0 refills | Status: DC | PRN

## 2024-03-28 NOTE — Patient Instructions (Signed)

## 2024-03-28 NOTE — Progress Notes (Signed)
 Orthopaedic Clinic Return  Assessment: Emma Morris is a 63 y.o. female with the following: Bilateral knee arthritis  Plan: Emma Morris has severe bilateral knee arthritis.  Injections have been helpful.  She would like repeat injections today.  She did ask for refill of tramadol .  We have also provided her with an updated prescription for a walker.  Procedure note injection Right knee joint   Verbal consent was obtained to inject the right knee joint  Timeout was completed to confirm the site of injection.  The skin was prepped with alcohol and ethyl chloride was sprayed at the injection site.  A 21-gauge needle was used to inject 40 mg of Depo-Medrol  and 1% lidocaine  (3 cc) into the right knee using an anterolateral approach.  There were no complications. A sterile bandage was applied.   Procedure note injection Left knee joint   Verbal consent was obtained to inject the left knee joint  Timeout was completed to confirm the site of injection.  The skin was prepped with alcohol and ethyl chloride was sprayed at the injection site.  A 21-gauge needle was used to inject 40 mg of Depo-Medrol  and 1% lidocaine  (3 cc) into the left knee using an anterolateral approach.  There were no complications. A sterile bandage was applied.     The patient meets the AMA guidelines for Morbid obesity with BMI > 40.  The patient has been counseled on weight loss.    Follow-up: Return if symptoms worsen or fail to improve.   Subjective:  Chief Complaint  Patient presents with   Injections    Bilat knees   NDC: [id number]    History of Present Illness: Emma Morris is a 63 y.o. female who presents for repeat evaluation of her bilateral knees.  I have seen her several times in the past.  She has severe arthritis in bilateral knees.  Injections have been helpful.  She reports progressively worsening pain over the past couple of months.  She is also interested in a walker with a  seat.   Review of Systems: No fevers or chills No numbness or tingling No chest pain No shortness of breath No bowel or bladder dysfunction No GI distress No headachestra  Objective: There were no vitals taken for this visit.  Physical Exam:  Obese female.  No acute distress.  Alert and oriented.   Bilateral knees, diffuse anterior pain.  Tenderness along medial and lateral joint line.  Crepitus with ROM.  Varus alignment.  No increased laxity to varus or valgus stress.   IMAGING: I personally ordered and reviewed the following images:  No new imaging obtained today.   Oneil DELENA Horde, MD 03/28/2024 11:08 AM

## 2024-04-10 ENCOUNTER — Other Ambulatory Visit: Payer: Self-pay | Admitting: Cardiology

## 2024-04-10 MED ORDER — EMPAGLIFLOZIN 10 MG PO TABS: 10.0000 mg | ORAL_TABLET | Freq: Every day | ORAL | 0 refills | Status: AC

## 2024-04-10 MED ORDER — FUROSEMIDE 20 MG PO TABS: 20.0000 mg | ORAL_TABLET | Freq: Every day | ORAL | 0 refills | Status: DC

## 2024-04-10 MED ORDER — SPIRONOLACTONE 25 MG PO TABS: 25.0000 mg | ORAL_TABLET | Freq: Two times a day (BID) | ORAL | 0 refills | Status: DC

## 2024-04-10 MED ORDER — APIXABAN 5 MG PO TABS: 5.0000 mg | ORAL_TABLET | Freq: Two times a day (BID) | ORAL | 0 refills | Status: DC

## 2024-04-10 NOTE — Telephone Encounter (Signed)
*  STAT* If patient is at the pharmacy, call can be transferred to refill team.   1. Which medications need to be refilled? (please list name of each medication and dose if known)   spironolactone  (ALDACTONE ) 25 MG tablet  apixaban  (ELIQUIS ) 5 MG TABS tablet  cholecalciferol (VITAMIN D3) 25 MCG (1000 UNIT) tablet  furosemide  (LASIX ) 20 MG tablet (Expired)   2. Would you like to learn more about the convenience, safety, & potential cost savings by using the [facility]?   3. Are you open to using the [facility] (Type [facility]. ).  4. Which pharmacy/location (including street and city if local pharmacy) is medication to be sent to?  [facility] - [place], [place] - [address]   5. Do they need a 30 day or 90 day supply?   90 day  Patient stated she is completely out of these medications.  Patient has appointment scheduled with Dr. Debera on 1/14.

## 2024-04-10 NOTE — Telephone Encounter (Signed)
 Prescription refill request for Eliquis  received. Indication:afib Last office visit:upcoming Scr: 0.84  11/25 Age:63 Weight:134.6  kg  Prescription refilled

## 2024-04-17 ENCOUNTER — Inpatient Hospital Stay (HOSPITAL_COMMUNITY)
Admission: EM | Admit: 2024-04-17 | Discharge: 2024-04-19 | DRG: 193 | Disposition: A | Source: Ambulatory Visit | Attending: Family Medicine | Admitting: Family Medicine

## 2024-04-17 ENCOUNTER — Other Ambulatory Visit (HOSPITAL_COMMUNITY): Payer: Self-pay | Admitting: Internal Medicine

## 2024-04-17 ENCOUNTER — Encounter (HOSPITAL_COMMUNITY): Payer: Self-pay

## 2024-04-17 ENCOUNTER — Ambulatory Visit (HOSPITAL_COMMUNITY)
Admission: RE | Admit: 2024-04-17 | Discharge: 2024-04-17 | Disposition: A | Source: Ambulatory Visit | Attending: Internal Medicine

## 2024-04-17 ENCOUNTER — Emergency Department (HOSPITAL_COMMUNITY)

## 2024-04-17 DIAGNOSIS — I5032 Chronic diastolic (congestive) heart failure: Secondary | ICD-10-CM | POA: Diagnosis not present

## 2024-04-17 DIAGNOSIS — J188 Other pneumonia, unspecified organism: Principal | ICD-10-CM

## 2024-04-17 DIAGNOSIS — I517 Cardiomegaly: Secondary | ICD-10-CM | POA: Insufficient documentation

## 2024-04-17 DIAGNOSIS — Z86711 Personal history of pulmonary embolism: Secondary | ICD-10-CM

## 2024-04-17 DIAGNOSIS — J9601 Acute respiratory failure with hypoxia: Secondary | ICD-10-CM | POA: Diagnosis not present

## 2024-04-17 DIAGNOSIS — I10 Essential (primary) hypertension: Secondary | ICD-10-CM

## 2024-04-17 DIAGNOSIS — R7989 Other specified abnormal findings of blood chemistry: Secondary | ICD-10-CM | POA: Diagnosis not present

## 2024-04-17 DIAGNOSIS — J189 Pneumonia, unspecified organism: Secondary | ICD-10-CM | POA: Diagnosis not present

## 2024-04-17 DIAGNOSIS — Z8679 Personal history of other diseases of the circulatory system: Secondary | ICD-10-CM

## 2024-04-17 LAB — COMPREHENSIVE METABOLIC PANEL WITH GFR
ALT: 13 U/L (ref 0–44)
AST: 23 U/L (ref 15–41)
Albumin: 4.4 g/dL (ref 3.5–5.0)
Alkaline Phosphatase: 78 U/L (ref 38–126)
Anion gap: 16 — ABNORMAL HIGH (ref 5–15)
BUN: 12 mg/dL (ref 8–23)
CO2: 24 mmol/L (ref 22–32)
Calcium: 9.8 mg/dL (ref 8.9–10.3)
Chloride: 99 mmol/L (ref 98–111)
Creatinine, Ser: 0.85 mg/dL (ref 0.44–1.00)
GFR, Estimated: >60 mL/min (ref >60)
Glucose, Bld: 79 mg/dL (ref 70–99)
Potassium: 4 mmol/L (ref 3.5–5.1)
Sodium: 139 mmol/L (ref 135–145)
Total Bilirubin: 0.6 mg/dL (ref 0.0–1.2)
Total Protein: 10 g/dL — ABNORMAL HIGH (ref 6.5–8.1)

## 2024-04-17 LAB — CBC WITH DIFFERENTIAL/PLATELET
Abs Immature Granulocytes: 0.04 K/uL (ref 0.00–0.07)
Basophils Absolute: 0 K/uL (ref 0.0–0.1)
Basophils Relative: 0 %
Eosinophils Absolute: 0.2 K/uL (ref 0.0–0.5)
Eosinophils Relative: 4 %
HCT: 44.5 % (ref 36.0–46.0)
Hemoglobin: 13.7 g/dL (ref 12.0–15.0)
Immature Granulocytes: 1 %
Lymphocytes Relative: 15 %
Lymphs Abs: 1 K/uL (ref 0.7–4.0)
MCH: 26.7 pg (ref 26.0–34.0)
MCHC: 30.8 g/dL (ref 30.0–36.0)
MCV: 86.7 fL (ref 80.0–100.0)
Monocytes Absolute: 0.4 K/uL (ref 0.1–1.0)
Monocytes Relative: 6 %
Neutro Abs: 5.1 K/uL (ref 1.7–7.7)
Neutrophils Relative %: 74 %
Platelets: 272 K/uL (ref 150–400)
RBC: 5.13 MIL/uL — ABNORMAL HIGH (ref 3.87–5.11)
RDW: 15.4 % (ref 11.5–15.5)
WBC: 6.8 K/uL (ref 4.0–10.5)
nRBC: 0 % (ref 0.0–0.2)

## 2024-04-17 LAB — RESP PANEL BY RT-PCR (RSV, FLU A&B, COVID)  RVPGX2
Influenza A by PCR: NEGATIVE
Influenza B by PCR: NEGATIVE
Resp Syncytial Virus by PCR: NEGATIVE
SARS Coronavirus 2 by RT PCR: NEGATIVE

## 2024-04-17 LAB — TROPONIN T, HIGH SENSITIVITY
Troponin T High Sensitivity: 20 ng/L — ABNORMAL HIGH (ref 0–19)
Troponin T High Sensitivity: 21 ng/L — ABNORMAL HIGH (ref 0–19)

## 2024-04-17 LAB — PRO BRAIN NATRIURETIC PEPTIDE: Pro Brain Natriuretic Peptide: <50 pg/mL (ref <300.0)

## 2024-04-17 LAB — MAGNESIUM: Magnesium: 2.2 mg/dL (ref 1.7–2.4)

## 2024-04-17 MED ORDER — DM-GUAIFENESIN ER 30-600 MG PO TB12
1.0000 | ORAL_TABLET | Freq: Two times a day (BID) | ORAL | Status: DC
  Administered 2024-04-18 – 2024-04-19 (×4): 1 via ORAL
  Filled 2024-04-17 (×4): qty 1

## 2024-04-17 MED ORDER — ONDANSETRON HCL 4 MG PO TABS: 4.0000 mg | ORAL_TABLET | Freq: Four times a day (QID) | ORAL | Status: DC | PRN

## 2024-04-17 MED ORDER — METHYLPREDNISOLONE SODIUM SUCC 125 MG IJ SOLR
125.0000 mg | Freq: Once | INTRAMUSCULAR | Status: AC
  Administered 2024-04-17: 125 mg via INTRAVENOUS
  Filled 2024-04-17: qty 2

## 2024-04-17 MED ORDER — ALBUTEROL SULFATE (2.5 MG/3ML) 0.083% IN NEBU
2.5000 mg | INHALATION_SOLUTION | Freq: Once | RESPIRATORY_TRACT | Status: AC
  Administered 2024-04-17: 2.5 mg via RESPIRATORY_TRACT
  Filled 2024-04-17: qty 3

## 2024-04-17 MED ORDER — ACETAMINOPHEN 325 MG PO TABS
650.0000 mg | ORAL_TABLET | Freq: Four times a day (QID) | ORAL | Status: DC | PRN
  Administered 2024-04-18: 650 mg via ORAL
  Filled 2024-04-17: qty 2

## 2024-04-17 MED ORDER — SODIUM CHLORIDE 0.9 % IV SOLN
500.0000 mg | Freq: Once | INTRAVENOUS | Status: AC
  Administered 2024-04-17: 500 mg via INTRAVENOUS
  Filled 2024-04-17: qty 5

## 2024-04-17 MED ORDER — ENOXAPARIN SODIUM 40 MG/0.4ML IJ SOSY: 40.0000 mg | PREFILLED_SYRINGE | INTRAMUSCULAR | Status: DC

## 2024-04-17 MED ORDER — NITROGLYCERIN 2 % TD OINT
1.0000 [in_us] | TOPICAL_OINTMENT | Freq: Once | TRANSDERMAL | Status: AC
  Administered 2024-04-17: 1 [in_us] via TOPICAL
  Filled 2024-04-17: qty 1

## 2024-04-17 MED ORDER — ACETAMINOPHEN 650 MG RE SUPP: 650.0000 mg | Freq: Four times a day (QID) | RECTAL | Status: DC | PRN

## 2024-04-17 MED ORDER — SODIUM CHLORIDE 0.9 % IV SOLN
500.0000 mg | INTRAVENOUS | Status: DC
  Administered 2024-04-18: 500 mg via INTRAVENOUS
  Filled 2024-04-17 (×2): qty 5

## 2024-04-17 MED ORDER — SODIUM CHLORIDE 0.9 % IV SOLN
2.0000 g | INTRAVENOUS | Status: DC
  Administered 2024-04-18: 2 g via INTRAVENOUS
  Filled 2024-04-17: qty 20

## 2024-04-17 MED ORDER — IOHEXOL 350 MG/ML SOLN
100.0000 mL | Freq: Once | INTRAVENOUS | Status: AC | PRN
  Administered 2024-04-17: 100 mL via INTRAVENOUS

## 2024-04-17 MED ORDER — ISOSORBIDE MONONITRATE ER 30 MG PO TB24
30.0000 mg | ORAL_TABLET | Freq: Every day | ORAL | Status: DC
  Administered 2024-04-17 – 2024-04-18 (×2): 30 mg via ORAL
  Filled 2024-04-17 (×3): qty 1

## 2024-04-17 MED ORDER — ONDANSETRON HCL 4 MG/2ML IJ SOLN: 4.0000 mg | Freq: Four times a day (QID) | INTRAMUSCULAR | Status: DC | PRN

## 2024-04-17 MED ORDER — SODIUM CHLORIDE 0.9 % IV SOLN
1.0000 g | Freq: Once | INTRAVENOUS | Status: AC
  Administered 2024-04-17: 1 g via INTRAVENOUS
  Filled 2024-04-17: qty 10

## 2024-04-17 MED ORDER — IPRATROPIUM-ALBUTEROL 0.5-2.5 (3) MG/3ML IN SOLN
3.0000 mL | Freq: Once | RESPIRATORY_TRACT | Status: AC
  Administered 2024-04-17: 3 mL via RESPIRATORY_TRACT
  Filled 2024-04-17: qty 3

## 2024-04-17 NOTE — ED Provider Notes (Signed)
 [place] EMERGENCY DEPARTMENT AT [facility] Provider Note   CSN: [id number] Arrival date & time: 04/17/24  1706     Patient presents with: Shortness of Breath   Emma Morris is a 63 y.o. female.    Shortness of Breath Associated symptoms: cough   Patient presents for shortness of breath, cough.  Medical history includes asthma, HTN, PE, CHF, atrial fibrillation.  She is prescribed Eliquis , but has been off of it for the past 2 weeks.  She does take daily Lasix .  She has had increased swelling in her legs.  She denies any areas of discomfort, but has had scratchiness in her throat.  Her symptoms have been ongoing for the past week.  Cough has been productive of a clear sputum.  She was seen at urgent care earlier today.  She underwent a chest x-ray and was sent to the ED for further evaluation.     Prior to Admission medications   Medication Sig Start Date End Date Taking? Authorizing Provider  acetaminophen  (TYLENOL ) 500 MG tablet Take 1,000 mg by mouth every 6 (six) hours as needed for mild pain (pain score 1-3).   Yes [provider]  albuterol  (VENTOLIN  HFA) 108 (90 Base) MCG/ACT inhaler Inhale 2 puffs into the lungs every 6 (six) hours as needed for wheezing or shortness of breath. 01/08/23  Yes Emokpae, Courage, MD  apixaban  (ELIQUIS ) 5 MG TABS tablet Take 1 tablet (5 mg total) by mouth 2 (two) times daily. 04/10/24  Yes Debera Jayson MATSU, MD  cholecalciferol (VITAMIN D3) 25 MCG (1000 UNIT) tablet Take 1,000 Units by mouth daily.   Yes [provider]  diltiazem  (TIAZAC ) 180 MG 24 hr capsule Take 180 mg by mouth daily. 03/20/24  Yes [provider]  empagliflozin  (JARDIANCE ) 10 MG TABS tablet Take 1 tablet (10 mg total) by mouth daily before breakfast. 04/10/24  Yes Debera Jayson MATSU, MD  fluticasone (FLONASE) 50 MCG/ACT nasal spray Place 1 spray into both nostrils daily as needed for allergies. 01/27/21  Yes [provider]   furosemide  (LASIX ) 20 MG tablet Take 1 tablet (20 mg total) by mouth daily. 04/10/24 04/10/25 Yes Debera Jayson MATSU, MD  loratadine  (CLARITIN ) 10 MG tablet Take 10 mg by mouth daily.   Yes [provider]  potassium chloride  (KLOR-CON ) 10 MEQ tablet Take 1 tablet (10 mEq total) by mouth daily. Take While taking Lasix /furosemide  01/08/23  Yes Emokpae, Courage, MD  spironolactone  (ALDACTONE ) 25 MG tablet Take 1 tablet (25 mg total) by mouth 2 (two) times daily. 04/10/24  Yes Debera Jayson MATSU, MD  traMADol  (ULTRAM ) 50 MG tablet Take 1 tablet (50 mg total) by mouth every 6 (six) hours as needed. Patient taking differently: Take 50 mg by mouth every 6 (six) hours as needed for moderate pain (pain score 4-6). 03/28/24  Yes Onesimo Oneil DELENA, MD    Allergies: Patient has no known allergies.    Review of Systems  Respiratory:  Positive for cough and shortness of breath.   Cardiovascular:  Positive for leg swelling.  All other systems reviewed and are negative.   Updated Vital Signs BP (!) 149/95   Pulse 94   Temp 97.7 F (36.5 C) (Oral)   Resp 15   SpO2 95%   Physical Exam Vitals and nursing note reviewed.  Constitutional:      General: She is not in acute distress.    Appearance: She is well-developed. She is not ill-appearing, toxic-appearing or diaphoretic.  HENT:     Head: Normocephalic and atraumatic.     Mouth/Throat:     Mouth: Mucous membranes are moist.  Eyes:     Conjunctiva/sclera: Conjunctivae normal.  Cardiovascular:     Rate and Rhythm: Normal rate and regular rhythm.     Heart sounds: No murmur heard. Pulmonary:     Effort: Pulmonary effort is normal. No respiratory distress.     Breath sounds: Decreased breath sounds, wheezing and rhonchi present.  Abdominal:     Palpations: Abdomen is soft.     Tenderness: There is no abdominal tenderness.  Musculoskeletal:        General: No swelling.     Cervical back: Normal range of motion and neck supple.      Right lower leg: Edema present.     Left lower leg: Edema present.  Skin:    General: Skin is warm and dry.  Neurological:     General: No focal deficit present.     Mental Status: She is alert and oriented to person, place, and time.  Psychiatric:        Mood and Affect: Mood normal.        Behavior: Behavior normal.     (all labs ordered are listed, but only abnormal results are displayed) Labs Reviewed  COMPREHENSIVE METABOLIC PANEL WITH GFR - Abnormal; Notable for the following components:      Result Value   Total Protein 10.0 (*)    Anion gap 16 (*)    All other components within normal limits  CBC WITH DIFFERENTIAL/PLATELET - Abnormal; Notable for the following components:   RBC 5.13 (*)    All other components within normal limits  TROPONIN T, HIGH SENSITIVITY - Abnormal; Notable for the following components:   Troponin T High Sensitivity 20 (*)    All other components within normal limits  RESP PANEL BY RT-PCR (RSV, FLU A&B, COVID)  RVPGX2  PRO BRAIN NATRIURETIC PEPTIDE  MAGNESIUM  TROPONIN T, HIGH SENSITIVITY    EKG: None  Radiology: DG Chest 2 View Result Date: 04/17/2024 EXAM: 2 VIEW(S) XRAY OF THE CHEST 04/17/2024 04:58:00 PM COMPARISON: 01/02/2023 CLINICAL HISTORY: bibasilar infiltrates present and costophrencic angles are clear FINDINGS: LUNGS AND PLEURA: Low lung volumes. Diffuse interstitial prominence airspace opacities with a perihilar predominance. Trace left pleural effusion. Retrocardiac airspace opacity. No pneumothorax. HEART AND MEDIASTINUM: Cardiomegaly. BONES AND SOFT TISSUES: No acute osseous abnormality. IMPRESSION: 1. Diffuse interstitial prominence and airspace opacities with perihilar predominance, including retrocardiac opacity. Findings may be due to edema or pneumonia. Electronically signed by: Norman Gatlin MD 04/17/2024 08:46 PM EST RP Workstation: [id number]   CT Angio Chest PE W and/or Wo Contrast Result Date: 04/17/2024 EXAM: CTA of the  Chest with contrast for PE 04/17/2024 08:38:49 PM TECHNIQUE: CTA of the chest was performed without and with the administration of 100 mL of iohexol  (OMNIPAQUE ) 350 MG/ML injection. Multiplanar reformatted images are provided for review. MIP images are provided for review. Automated exposure control, iterative reconstruction, and/or weight based adjustment of the mA/kV was utilized to reduce the radiation dose to as low as reasonably achievable. COMPARISON: Same day x-ray and CT 11/27/2022. CLINICAL HISTORY: Pulmonary embolism (PE) suspected, high prob. FINDINGS: PULMONARY ARTERIES: Pulmonary arteries are adequately opacified for evaluation. Negative for pulmonary embolism noting limited evaluation of the peripheral pulmonary artery branches due to motion artifact . Dilated main pulmonary artery measuring 39 mm which can be seen with pulmonary hypertension. MEDIASTINUM: Cardiomegaly. The heart and pericardium demonstrate  no acute abnormality. There is no acute abnormality of the thoracic aorta. LYMPH NODES: No mediastinal, hilar or axillary lymphadenopathy. LUNGS AND PLEURA: Patchy bilateral ground glass opacities with a peribronchovascular distribution suggesting atypical pneumonia. No pleural effusion or pneumothorax. UPPER ABDOMEN: Cholelithiasis. Small hiatal hernia. Limited images of the upper abdomen are otherwise unremarkable. SOFT TISSUES AND BONES: No acute bone or soft tissue abnormality. IMPRESSION: 1. No pulmonary embolism. 2. Patchy bilateral ground glass opacities with a peribronchovascular distribution, suggesting atypical pneumonia. 3. Dilated main pulmonary artery measuring 39 mm, which can be seen with pulmonary hypertension. 4. Cardiomegaly. Electronically signed by: Norman Gatlin MD 04/17/2024 08:45 PM EST RP Workstation: [id number]     Procedures   Medications Ordered in the ED  isosorbide mononitrate (IMDUR) 24 hr tablet 30 mg (30 mg Oral Given 04/17/24 1814)  albuterol  (PROVENTIL ) (2.5  MG/3ML) 0.083% nebulizer solution 2.5 mg (has no administration in time range)  cefTRIAXone  (ROCEPHIN ) 1 g in sodium chloride  0.9 % 100 mL IVPB (has no administration in time range)  azithromycin  (ZITHROMAX ) 500 mg in sodium chloride  0.9 % 250 mL IVPB (has no administration in time range)  nitroGLYCERIN (NITROGLYN) 2 % ointment 1 inch (1 inch Topical Given 04/17/24 1812)  ipratropium-albuterol  (DUONEB) 0.5-2.5 (3) MG/3ML nebulizer solution 3 mL (3 mLs Nebulization Given 04/17/24 1814)  methylPREDNISolone  sodium succinate (SOLU-MEDROL ) 125 mg/2 mL injection 125 mg (125 mg Intravenous Given 04/17/24 2025)  iohexol  (OMNIPAQUE ) 350 MG/ML injection 100 mL (100 mLs Intravenous Contrast Given 04/17/24 2032)                                    Medical Decision Making Amount and/or Complexity of Data Reviewed Labs: ordered. Radiology: ordered.  Risk Prescription drug management.   This patient presents to the ED for concern of shortness of breath, this involves an extensive number of treatment options, and is a complaint that carries with it a high risk of complications and morbidity.  The differential diagnosis includes reactive airway disease exacerbation, pneumonia, CHF, URI, hypertensive crisis   Co morbidities / Chronic conditions that complicate the patient evaluation  asthma, HTN, PE, CHF, atrial fibrillation   Additional history obtained:  Additional history obtained from EMR External records from outside source obtained and reviewed including N/A   Lab Tests:  I Ordered, and personally interpreted labs.  The pertinent results include: Normal hemoglobin, no leukocytosis, normal kidney function, normal electrolytes, normal BNP.   Imaging Studies ordered:  I ordered imaging studies including CTA chest I independently visualized and interpreted imaging which showed patchy bilateral ground glass opacities consistent with atypical pneumonia. I agree with the radiologist  interpretation   Cardiac Monitoring: / EKG:  The patient was maintained on a cardiac monitor.  I personally viewed and interpreted the cardiac monitored which showed an underlying rhythm of: Sinus rhythm   Problem List / ED Course / Critical interventions / Medication management  Patient presenting for shortness of breath over the past week.  She was seen urgent care prior to arrival.  X-ray shows bilateral opacification consistent with pulmonary edema.  On arrival in ED, vital signs are notable for hypertension, tachycardia, tachypnea.  Her current SpO2 is in the low 90s on room air.  On exam, her breathing is unlabored.  She is able to speak in complete sentences.  She does have diminished breath sounds on lung auscultation, primarily in the bibasilar areas.  I suspect CHF  exacerbation.  NTG ointment and Imdur ordered to decrease afterload.  Given her history of asthma, DuoNeb was ordered.  Workup was initiated.  Patient did feel improved after DuoNeb.  She did develop hypoxia while in the emergency department.  She was placed on 2 L of supplemental oxygen.  Solu-Medrol  and additional albuterol  were ordered.  Patient underwent CTA which did show patchy bilateral areas of ground glass opacities consistent with atypical pneumonia.  Antibiotics were ordered.  Given her acute hypoxic respiratory failure, patient to be admitted. I ordered medication including Solu-Medrol , DuoNeb, albuterol  for reactive airway disease; Imdur and NTG for HTN; ceftriaxone  and azithromycin  for pneumonia Reevaluation of the patient after these medicines showed that the patient improved I have reviewed the patients home medicines and have made adjustments as needed  Social Determinants of Health:  Lives independently  CRITICAL CARE Performed by: Bernardino Fireman   Total critical care time: 32 minutes  Critical care time was exclusive of separately billable procedures and treating other patients.  Critical care was  necessary to treat or prevent imminent or life-threatening deterioration.  Critical care was time spent personally by me on the following activities: development of treatment plan with patient and/or surrogate as well as nursing, discussions with consultants, evaluation of patient's response to treatment, examination of patient, obtaining history from patient or surrogate, ordering and performing treatments and interventions, ordering and review of laboratory studies, ordering and review of radiographic studies, pulse oximetry and re-evaluation of patient's condition.      Final diagnoses:  Multifocal pneumonia  Acute respiratory failure with hypoxia [facility])    ED Discharge Orders     None          Fireman Bernardino, MD 04/17/24 2055

## 2024-04-17 NOTE — H&P (Addendum)
 History and Physical    Patient: Emma Morris FMW:[id number] DOB: 01/20/1961 DOA: 04/17/2024 DOS: the patient was seen and examined on 04/17/2024 PCP: Katrinka Aquas, MD  Patient coming from: Home  Chief Complaint:  Chief Complaint  Patient presents with   Shortness of Breath   HPI: Emma Morris is a 63 y.o. female with medical history significant of hypertension, history of PE on Eliquis , OHS, chronic diastolic heart failure who presents to the emergency department due to shortness of breath.  She complained of about 4-day onset of irritation described as scratchiness in her throat, chest congestion, productive cough with clear sputum.  She went to an urgent care earlier today, checks x-ray was done and she was asked to go to the ED for further evaluation based on findings.  She denies fever, chills, headache, chest pain, abdominal pain.  ED course In the emergency department, she was tachypneic, tachycardic, BP was 146/85.  She was reported to become hypoxic in the ED per EDP and required supplemental oxygen to maintain O2 sat of 94%.  Workup in the ED showed normal CBC and BMP.  Troponin 20 > 21, magnesium 2.2.  Respiratory panel was negative. CT angiography of chest showed no pulmonary embolism but was suggestive of atypical pneumonia Chest x-ray was suggestive of erythema or pneumonia Breathing treatment was provided, she was started on IV ceftriaxone  and azithromycin , IV Solu-Medrol  125 mg x 1 was given, Imdur and nitroglycerin were given.   Review of Systems: As mentioned in the history of present illness. All other systems reviewed and are negative. Past Medical History:  Diagnosis Date   Anemia    Arthritis    Asthma    Hypertension    Knee pain    Obesity hypoventilation syndrome [facility])    Pulmonary embolus [facility])    Diagnosed July 2024   Respiratory failure with hypoxia [facility])    Past Surgical History:  Procedure Laterality Date   CESAREAN SECTION      COLONOSCOPY WITH PROPOFOL  N/A 11/09/2021   Procedure: COLONOSCOPY WITH PROPOFOL ;  Surgeon: Cindie Carlin POUR, DO;  Location: [facility] ENDO SUITE;  Service: Endoscopy;  Laterality: N/A;  11:00am   DILITATION & CURRETTAGE/HYSTROSCOPY WITH THERMACHOICE ABLATION N/A 05/04/2013   Procedure: DILATATION & CURETTAGE/HYSTEROSCOPY WITH THERMACHOICE ABLATION;  Surgeon: Vonn VEAR Inch, MD;  Location: [facility] ORS;  Service: Gynecology;  Laterality: N/A;  temperature:87 degrees; total therapy time:14 minutes and 5 seconds   ESOPHAGOGASTRODUODENOSCOPY (EGD) WITH PROPOFOL  N/A 11/09/2021   Procedure: ESOPHAGOGASTRODUODENOSCOPY (EGD) WITH PROPOFOL ;  Surgeon: Cindie Carlin POUR, DO;  Location: [facility] ENDO SUITE;  Service: Endoscopy;  Laterality: N/A;   TONSILLECTOMY     Social History:  reports that she has never smoked. She has never used smokeless tobacco. She reports that she does not drink alcohol and does not use drugs.  No Known Allergies  Family History  Problem Relation Age of Onset   Cancer Mother    Kidney disease Father    Diabetes Father    Colon cancer Neg Hx     Prior to Admission medications   Medication Sig Start Date End Date Taking? Authorizing Provider  acetaminophen  (TYLENOL ) 500 MG tablet Take 1,000 mg by mouth every 6 (six) hours as needed for mild pain (pain score 1-3).   Yes [provider]  albuterol  (VENTOLIN  HFA) 108 (90 Base) MCG/ACT inhaler Inhale 2 puffs into the lungs every 6 (six) hours as needed for wheezing or shortness of breath. 01/08/23  Yes Pearlean Manus, MD  apixaban  (ELIQUIS ) 5 MG TABS tablet Take 1 tablet (5 mg total) by mouth 2 (two) times daily. 04/10/24  Yes Debera Jayson MATSU, MD  cholecalciferol (VITAMIN D3) 25 MCG (1000 UNIT) tablet Take 1,000 Units by mouth daily.   Yes [provider]  diltiazem  (TIAZAC ) 180 MG 24 hr capsule Take 180 mg by mouth daily. 03/20/24  Yes [provider]  empagliflozin  (JARDIANCE ) 10 MG TABS tablet Take 1 tablet (10 mg  total) by mouth daily before breakfast. 04/10/24  Yes Debera Jayson MATSU, MD  fluticasone (FLONASE) 50 MCG/ACT nasal spray Place 1 spray into both nostrils daily as needed for allergies. 01/27/21  Yes [provider]  furosemide  (LASIX ) 20 MG tablet Take 1 tablet (20 mg total) by mouth daily. 04/10/24 04/10/25 Yes Debera Jayson MATSU, MD  loratadine  (CLARITIN ) 10 MG tablet Take 10 mg by mouth daily.   Yes [provider]  potassium chloride  (KLOR-CON ) 10 MEQ tablet Take 1 tablet (10 mEq total) by mouth daily. Take While taking Lasix /furosemide  01/08/23  Yes Emokpae, Courage, MD  spironolactone  (ALDACTONE ) 25 MG tablet Take 1 tablet (25 mg total) by mouth 2 (two) times daily. 04/10/24  Yes Debera Jayson MATSU, MD  traMADol  (ULTRAM ) 50 MG tablet Take 1 tablet (50 mg total) by mouth every 6 (six) hours as needed. Patient taking differently: Take 50 mg by mouth every 6 (six) hours as needed for moderate pain (pain score 4-6). 03/28/24  Yes Onesimo Oneil LABOR, MD    Physical Exam: Vitals:   04/17/24 1845 04/17/24 2114 04/17/24 2130 04/17/24 2133  BP:   (!) 143/74   Pulse: 94  89   Resp: 15  15   Temp:    98.3 F (36.8 C)  TempSrc:    Oral  SpO2: 95% 94% 94%    General: Awake and alert and oriented x3. Not in any acute distress.  HEENT: NCAT.  PERRLA. EOMI. Sclerae anicteric.  Moist mucosal membranes. Neck: Neck supple without lymphadenopathy. No carotid bruits. No masses palpated.  Cardiovascular: Regular rate with normal S1-S2 sounds. No murmurs, rubs or gallops auscultated. No JVD.  Respiratory: Clear breath sounds.  No accessory muscle use. Abdomen: Soft, nontender, nondistended. Active bowel sounds. No masses or hepatosplenomegaly  Skin: No rashes, lesions, or ulcerations.  Dry, warm to touch. Musculoskeletal:  2+ dorsalis pedis and radial pulses. Good ROM.  No contractures  Psychiatric: Intact judgment and insight.  Mood appropriate to current condition. Neurologic: No focal  neurological deficits. Strength is 5/5 x 4.  CN II - XII grossly intact.  Assessment and Plan: CAP POA Patient was started on ceftriaxone  and azithromycin , we shall continue same at this time with plan to de-escalate/discontinue based on blood culture, sputum culture, urine Legionella, strep pneumo and procalcitonin Continue Tylenol  as needed Continue Mucinex , incentive spirometry, flutter valve   Acute respiratory failure with hypoxia Continue supplemental oxygen to maintain O2 sats > 92% with plan to wean patient off this as tolerated  Elevated troponin possibly secondary to type II demand ischemia Troponin 20 > 21, patient denies chest pain.  Continue to trend troponin  Chronic diastolic CHF Continue total input/output, daily weights and fluid restriction Continue home Lasix , Jardiance  Continue heart healthy diet   History of pulmonary embolism Continue Eliquis   History of atrial fibrillation Continue Cardizem , Eliquis   Essential hypertension Continue Lasix , Cardizem  Continue Klor-Con     Advance Care Planning: Full code  Consults: None  Family Communication: None at bedside  Severity of Illness: The appropriate  patient status for this patient is INPATIENT. Inpatient status is judged to be reasonable and necessary in order to provide the required intensity of service to ensure the patient's safety. The patient's presenting symptoms, physical exam findings, and initial radiographic and laboratory data in the context of their chronic comorbidities is felt to place them at high risk for further clinical deterioration. Furthermore, it is not anticipated that the patient will be medically stable for discharge from the hospital within 2 midnights of admission.   * I certify that at the point of admission it is my clinical judgment that the patient will require inpatient hospital care spanning beyond 2 midnights from the point of admission due to high intensity of service, high risk  for further deterioration and high frequency of surveillance required.*  Author: Isabell Bonafede, DO 04/17/2024 11:03 PM  For on call review [email or web address].

## 2024-04-17 NOTE — ED Notes (Signed)
 2L O2 [place] administered.

## 2024-04-17 NOTE — ED Notes (Addendum)
 Emma Morris

## 2024-04-17 NOTE — ED Triage Notes (Signed)
 POV, pt report productive cough, difficulty breathing.   Symptoms present for about a week, pt was seen by primary doctor who recommended coming to the ER.  Hx COPD and CHF.   PT aox4. Report itching on throat 10 out 10.

## 2024-04-17 NOTE — H&P (Incomplete)
 History and Physical    Patient: Emma Morris FMW:[id number] DOB: 1960-05-30 DOA: 04/17/2024 DOS: the patient was seen and examined on 04/17/2024 PCP: Emma Aquas, Morris  Patient coming from: Home  Chief Complaint:  Chief Complaint  Patient presents with  . Shortness of Breath   HPI: Emma Morris is a 63 y.o. female with medical history significant of hypertension, history of PE on Eliquis , OHS, chronic diastolic heart failure who presents to the emergency department due to shortness of breath.  She complained of about 4-day onset of irritation described as scratchiness in her throat, chest congestion, productive cough with clear sputum.  She went to an urgent care earlier today, checks x-ray was done and she was asked to go to the ED for further evaluation based on findings.  She denies fever, chills, headache, chest pain, abdominal pain.  ED course In the emergency department, she was tachypneic, tachycardic, BP was 146/85.  She was reported to become hypoxic in the ED per EDP and required supplemental oxygen to maintain O2 sat of 94%.  Workup in the ED showed normal CBC and BMP.  Troponin 20 > 21, magnesium 2.2.  Respiratory panel was negative. CT angiography of chest showed no pulmonary embolism but was suggestive of atypical pneumonia Chest x-ray was suggestive of erythema or pneumonia Breathing treatment was provided, she was started on IV ceftriaxone  and azithromycin , IV Solu-Medrol  125 mg x 1 was given, Imdur and nitroglycerin were given.   Review of Systems: As mentioned in the history of present illness. All other systems reviewed and are negative. Past Medical History:  Diagnosis Date  . Anemia   . Arthritis   . Asthma   . Hypertension   . Knee pain   . Obesity hypoventilation syndrome (HCC)   . Pulmonary embolus [facility])    Diagnosed July 2024  . Respiratory failure with hypoxia [facility])    Past Surgical History:  Procedure Laterality Date  . CESAREAN SECTION     . COLONOSCOPY WITH PROPOFOL  N/A 11/09/2021   Procedure: COLONOSCOPY WITH PROPOFOL ;  Surgeon: Emma Carlin POUR, DO;  Location: [facility];  Service: Endoscopy;  Laterality: N/A;  11:00am  . DILITATION & CURRETTAGE/HYSTROSCOPY WITH THERMACHOICE ABLATION N/A 05/04/2013   Procedure: DILATATION & CURETTAGE/HYSTEROSCOPY WITH THERMACHOICE ABLATION;  Surgeon: Emma VEAR Inch, Morris;  Location: [facility] ORS;  Service: Gynecology;  Laterality: N/A;  temperature:87 degrees; total therapy time:14 minutes and 5 seconds  . ESOPHAGOGASTRODUODENOSCOPY (EGD) WITH PROPOFOL  N/A 11/09/2021   Procedure: ESOPHAGOGASTRODUODENOSCOPY (EGD) WITH PROPOFOL ;  Surgeon: Emma Carlin POUR, DO;  Location: [facility];  Service: Endoscopy;  Laterality: N/A;  . TONSILLECTOMY     Social History:  reports that she has never smoked. She has never used smokeless tobacco. She reports that she does not drink alcohol and does not use drugs.  No Known Allergies  Family History  Problem Relation Age of Onset  . Cancer Mother   . Kidney disease Father   . Diabetes Father   . Colon cancer Neg Hx     Prior to Admission medications   Medication Sig Start Date End Date Taking? Authorizing Provider  acetaminophen  (TYLENOL ) 500 MG tablet Take 1,000 mg by mouth every 6 (six) hours as needed for mild pain (pain score 1-3).   Yes Provider, Historical, Morris  albuterol  (VENTOLIN  HFA) 108 (90 Base) MCG/ACT inhaler Inhale 2 puffs into the lungs every 6 (six) hours as needed for wheezing or shortness of breath. 01/08/23  Yes Emma Manus, Morris  apixaban  (ELIQUIS ) 5 MG TABS tablet Take 1 tablet (5 mg total) by mouth 2 (two) times daily. 04/10/24  Yes Emma Jayson MATSU, Morris  cholecalciferol (VITAMIN D3) 25 MCG (1000 UNIT) tablet Take 1,000 Units by mouth daily.   Yes Provider, Historical, Morris  diltiazem  (TIAZAC ) 180 MG 24 hr capsule Take 180 mg by mouth daily. 03/20/24  Yes Provider, Historical, Morris  empagliflozin  (JARDIANCE ) 10 MG TABS tablet Take 1 tablet  (10 mg total) by mouth daily before breakfast. 04/10/24  Yes Emma Jayson MATSU, Morris  fluticasone (FLONASE) 50 MCG/ACT nasal spray Place 1 spray into both nostrils daily as needed for allergies. 01/27/21  Yes Provider, Historical, Morris  furosemide  (LASIX ) 20 MG tablet Take 1 tablet (20 mg total) by mouth daily. 04/10/24 04/10/25 Yes Emma Jayson MATSU, Morris  loratadine  (CLARITIN ) 10 MG tablet Take 10 mg by mouth daily.   Yes Provider, Historical, Morris  potassium chloride  (KLOR-CON ) 10 MEQ tablet Take 1 tablet (10 mEq total) by mouth daily. Take While taking Lasix /furosemide  01/08/23  Yes Emma Morris, Emma Morris  spironolactone  (ALDACTONE ) 25 MG tablet Take 1 tablet (25 mg total) by mouth 2 (two) times daily. 04/10/24  Yes Emma Jayson MATSU, Morris  traMADol  (ULTRAM ) 50 MG tablet Take 1 tablet (50 mg total) by mouth every 6 (six) hours as needed. Patient taking differently: Take 50 mg by mouth every 6 (six) hours as needed for moderate pain (pain score 4-6). 03/28/24  Yes Emma Morris LABOR, Morris    Physical Exam: Vitals:   04/17/24 1845 04/17/24 2114 04/17/24 2130 04/17/24 2133  BP:   (!) 143/74   Pulse: 94  89   Resp: 15  15   Temp:    98.3 F (36.8 C)  TempSrc:    Oral  SpO2: 95% 94% 94%    General: Awake and alert and oriented x3. Not in any acute distress.  HEENT: NCAT.  PERRLA. EOMI. Sclerae anicteric.  Moist mucosal membranes. Neck: Neck supple without lymphadenopathy. No carotid bruits. No masses palpated.  Cardiovascular: Regular rate with normal S1-S2 sounds. No murmurs, rubs or gallops auscultated. No JVD.  Respiratory: Clear breath sounds.  No accessory muscle use. Abdomen: Soft, nontender, nondistended. Active bowel sounds. No masses or hepatosplenomegaly  Skin: No rashes, lesions, or ulcerations.  Dry, warm to touch. Musculoskeletal:  2+ dorsalis pedis and radial pulses. Good ROM.  No contractures  Psychiatric: Intact judgment and insight.  Mood appropriate to current condition. Neurologic: No  focal neurological deficits. Strength is 5/5 x 4.  CN II - XII grossly intact.  Assessment and Plan: CAP POA Patient was started on ceftriaxone  and azithromycin , we shall continue same at this time with plan to de-escalate/discontinue based on blood culture, sputum culture, urine Legionella, strep pneumo and procalcitonin Continue Tylenol  as needed Continue Mucinex , incentive spirometry, flutter valve   Acute respiratory failure with hypoxia Continue supplemental oxygen to maintain O2 sats > 92% with plan to wean patient off this as tolerated  Elevated troponin possibly secondary to type II demand ischemia Troponin 20 > 21  Chronic diastolic CHF Continue total input/output, daily weights and fluid restriction Continue home Lasix  Continue heart healthy diet   History of pulmonary embolism Continue Eliquis   History of atrial fibrillation Continue Cardizem , Eliquis     Advance Care Planning:   Code Status: Prior ***  Consults: ***  Family Communication: ***  Severity of Illness: {Observation/Inpatient:21159}  Author: Posey Maier, DO 04/17/2024 11:03 PM  For on call review [email or web address].

## 2024-04-18 ENCOUNTER — Other Ambulatory Visit: Payer: Self-pay

## 2024-04-18 DIAGNOSIS — J189 Pneumonia, unspecified organism: Secondary | ICD-10-CM | POA: Diagnosis not present

## 2024-04-18 LAB — CBC
HCT: 40.4 % (ref 36.0–46.0)
Hemoglobin: 12.1 g/dL (ref 12.0–15.0)
MCH: 25.9 pg — ABNORMAL LOW (ref 26.0–34.0)
MCHC: 30 g/dL (ref 30.0–36.0)
MCV: 86.3 fL (ref 80.0–100.0)
Platelets: 247 K/uL (ref 150–400)
RBC: 4.68 MIL/uL (ref 3.87–5.11)
RDW: 15.3 % (ref 11.5–15.5)
WBC: 6.8 K/uL (ref 4.0–10.5)
nRBC: 0 % (ref 0.0–0.2)

## 2024-04-18 LAB — COMPREHENSIVE METABOLIC PANEL WITH GFR
ALT: 12 U/L (ref 0–44)
AST: 16 U/L (ref 15–41)
Albumin: 4 g/dL (ref 3.5–5.0)
Alkaline Phosphatase: 69 U/L (ref 38–126)
Anion gap: 14 (ref 5–15)
BUN: 10 mg/dL (ref 8–23)
CO2: 26 mmol/L (ref 22–32)
Calcium: 9.2 mg/dL (ref 8.9–10.3)
Chloride: 98 mmol/L (ref 98–111)
Creatinine, Ser: 0.75 mg/dL (ref 0.44–1.00)
GFR, Estimated: >60 mL/min (ref >60)
Glucose, Bld: 86 mg/dL (ref 70–99)
Potassium: 4 mmol/L (ref 3.5–5.1)
Sodium: 138 mmol/L (ref 135–145)
Total Bilirubin: 0.4 mg/dL (ref 0.0–1.2)
Total Protein: 9 g/dL — ABNORMAL HIGH (ref 6.5–8.1)

## 2024-04-18 LAB — HIV ANTIBODY (ROUTINE TESTING W REFLEX): HIV Screen 4th Generation wRfx: NONREACTIVE

## 2024-04-18 LAB — PROCALCITONIN: Procalcitonin: <0.1 ng/mL

## 2024-04-18 LAB — TROPONIN T, HIGH SENSITIVITY
Troponin T High Sensitivity: 19 ng/L (ref 0–19)
Troponin T High Sensitivity: 18 ng/L (ref 0–19)

## 2024-04-18 LAB — PHOSPHORUS: Phosphorus: 3.3 mg/dL (ref 2.5–4.6)

## 2024-04-18 LAB — MAGNESIUM: Magnesium: 2.2 mg/dL (ref 1.7–2.4)

## 2024-04-18 MED ORDER — ALBUTEROL SULFATE (2.5 MG/3ML) 0.083% IN NEBU
2.5000 mg | INHALATION_SOLUTION | Freq: Four times a day (QID) | RESPIRATORY_TRACT | Status: DC | PRN
  Administered 2024-04-19: 2.5 mg via RESPIRATORY_TRACT
  Filled 2024-04-18: qty 3

## 2024-04-18 MED ORDER — EMPAGLIFLOZIN 10 MG PO TABS
10.0000 mg | ORAL_TABLET | Freq: Every day | ORAL | Status: DC
  Administered 2024-04-18 – 2024-04-19 (×2): 10 mg via ORAL
  Filled 2024-04-18 (×3): qty 1

## 2024-04-18 MED ORDER — POTASSIUM CHLORIDE CRYS ER 10 MEQ PO TBCR
10.0000 meq | EXTENDED_RELEASE_TABLET | Freq: Every day | ORAL | Status: DC
  Administered 2024-04-18 – 2024-04-19 (×2): 10 meq via ORAL
  Filled 2024-04-18 (×4): qty 1

## 2024-04-18 MED ORDER — ALBUTEROL SULFATE HFA 108 (90 BASE) MCG/ACT IN AERS: 2.0000 | INHALATION_SPRAY | Freq: Four times a day (QID) | RESPIRATORY_TRACT | Status: DC | PRN

## 2024-04-18 MED ORDER — DILTIAZEM HCL ER BEADS 180 MG PO CP24: 180.0000 mg | ORAL_CAPSULE | Freq: Every day | ORAL | Status: DC

## 2024-04-18 MED ORDER — FUROSEMIDE 20 MG PO TABS
20.0000 mg | ORAL_TABLET | Freq: Every day | ORAL | Status: DC
  Administered 2024-04-18 – 2024-04-19 (×2): 20 mg via ORAL
  Filled 2024-04-18 (×2): qty 1

## 2024-04-18 MED ORDER — POLYETHYLENE GLYCOL 3350 17 G PO PACK
17.0000 g | PACK | Freq: Two times a day (BID) | ORAL | Status: DC
  Administered 2024-04-18 – 2024-04-19 (×3): 17 g via ORAL
  Filled 2024-04-18 (×3): qty 1

## 2024-04-18 MED ORDER — APIXABAN 5 MG PO TABS
5.0000 mg | ORAL_TABLET | Freq: Two times a day (BID) | ORAL | Status: DC
  Administered 2024-04-18 – 2024-04-19 (×3): 5 mg via ORAL
  Filled 2024-04-18 (×3): qty 1

## 2024-04-18 MED ORDER — HYDROCOD POLI-CHLORPHE POLI ER 10-8 MG/5ML PO SUER
5.0000 mL | Freq: Two times a day (BID) | ORAL | Status: DC | PRN
  Administered 2024-04-18 – 2024-04-19 (×2): 5 mL via ORAL
  Filled 2024-04-18 (×2): qty 5

## 2024-04-18 MED ORDER — SENNOSIDES-DOCUSATE SODIUM 8.6-50 MG PO TABS
1.0000 | ORAL_TABLET | Freq: Every day | ORAL | Status: DC
  Administered 2024-04-18 – 2024-04-19 (×2): 1 via ORAL
  Filled 2024-04-18 (×2): qty 1

## 2024-04-18 MED ORDER — DILTIAZEM HCL ER COATED BEADS 180 MG PO CP24
180.0000 mg | ORAL_CAPSULE | Freq: Every day | ORAL | Status: DC
  Administered 2024-04-18 – 2024-04-19 (×2): 180 mg via ORAL
  Filled 2024-04-18 (×2): qty 1

## 2024-04-18 NOTE — Progress Notes (Signed)
 [facility] PROGRESS NOTE  Emma Morris (DOB: 08-10-1960) FMW:[id number] PCP: Katrinka Aquas, MD  Brief Narrative: Emma Morris is a 63 y.o. female with a history of HTN, history of PE on Eliquis , OHS, chronic HFpEF who presented to the emergency department due to shortness of breath.  She complained of about 4-day onset of irritation described as scratchiness in her throat, chest congestion, productive cough with clear sputum.  She went to an urgent care earlier today, checks x-ray was done and she was asked to go to the ED for further evaluation based on findings.  She denies fever, chills, headache, chest pain, abdominal pain.   ED course In the emergency department, she was tachypneic, tachycardic, BP was 146/85.  She was reported to become hypoxic in the ED per EDP and required supplemental oxygen to maintain O2 sat of 94%.  Workup in the ED showed normal CBC and BMP.  Troponin 20 > 21, magnesium 2.2.  Respiratory panel was negative. CT angiography of chest showed no pulmonary embolism but was suggestive of atypical pneumonia Chest x-ray was suggestive of erythema or pneumonia Breathing treatment was provided, she was started on IV ceftriaxone  and azithromycin , IV Solu-Medrol  125 mg x 1 was given, Imdur and nitroglycerin were given.  Subjective: Still with severe cough interrupting sleep despite mucinex  DM. No chest pain. Her breathing is easier than at admission but not at baseline despite still not having gotten OOB when checked in ED.  Objective: BP 122/75 (BP Location: Left Arm)   Pulse 81   Temp 97.9 F (36.6 C) (Oral)   Resp 18   SpO2 96%   Gen: No distress Pulm: Diminished, coarse, no wheezes  CV: RRR, no MRG GI: Soft, NT, ND, +BS  Neuro: Alert and oriented. No new focal deficits. Ext: Warm, no deformities Skin: No open rashes, lesions or ulcers on visualized skin   Assessment & Plan: CAP: - Augment antitussives.  - Continue ceftriaxone  and  azithromycin . CT features and reassuring PCT favor atypical organism. Legionella UAg pending.  - Monitor blood cultures and sputum culture.    Acute respiratory failure with hypoxia: Still on supplemental oxygen - Continue antibiotics and check ambulatory pulse oximetry. Maintain SpO2 >89%.     Type II demand ischemia: Very mild troponin elevation which normalized x2. No chest pain whatsoever.  - Check ECG.     Chronic HFpEF, HTN: BNP negative. - Continue home lasix  w/K supplement, jardiance , diltiazem .  - Monitor volume status.     History of pulmonary embolism: None on CTA at admission.  - Continue eliquis    PAF: Currently in NSR.  - Continue home diltiazem , DOAC  Bernardino KATHEE Come, MD [facility] [email or web address] 04/18/2024, 11:47 AM

## 2024-04-18 NOTE — Progress Notes (Signed)
 Transition of Care Department [facility]) has reviewed patient and no other TOC needs have been identified at this time. We will continue to monitor patient advancement through interdisciplinary progression rounds. If new patient transition needs arise, please place a TOC consult.   04/18/24 0958  TOC Brief Assessment  Insurance and Status Reviewed  Patient has primary care physician Yes  Home environment has been reviewed Lives with son.  Prior level of function: Independent.  Prior/Current Home Services No current home services  Social Drivers of Health Review SDOH reviewed no interventions necessary  Readmission risk has been reviewed Yes  Transition of care needs no transition of care needs at this time

## 2024-04-18 NOTE — Progress Notes (Signed)
 [id number] Received nursing handoff. Awaiting patient arrival to 45

## 2024-04-18 NOTE — Plan of Care (Signed)

## 2024-04-18 NOTE — Progress Notes (Signed)
   04/18/24 0846 04/18/24 0847 04/18/24 0848  MEWS COLOR  MEWS Score Color Green  --   --   Oxygen Therapy  SpO2 (!) 85 % 91 % 96 %  O2 Device Room Air (at rest after ambulation) Nasal Cannula Nasal Cannula  O2 Flow Rate (L/min)  --  2 L/min 2 L/min

## 2024-04-18 NOTE — Progress Notes (Signed)
   04/18/24 1552 04/18/24 1555 04/18/24 1557  Oxygen Therapy  SpO2 91 % (sitting) (!) 82 % (ambulating) (!) 87 % (sitting)  O2 Device Room Air Room Air Room Air  O2 Flow Rate (L/min)  --   --   --     04/18/24 1559  Oxygen Therapy  SpO2 94 % (sitting)  O2 Device Nasal Cannula  O2 Flow Rate (L/min) 2 L/min   Dyspneic with exertion

## 2024-04-19 DIAGNOSIS — J189 Pneumonia, unspecified organism: Secondary | ICD-10-CM | POA: Diagnosis not present

## 2024-04-19 MED ORDER — AZITHROMYCIN 250 MG PO TABS: 250.0000 mg | ORAL_TABLET | Freq: Every day | ORAL | 0 refills | Status: AC

## 2024-04-19 MED ORDER — HYDROCOD POLI-CHLORPHE POLI ER 10-8 MG/5ML PO SUER: 5.0000 mL | Freq: Two times a day (BID) | ORAL | 0 refills | Status: DC | PRN

## 2024-04-19 MED ORDER — ALBUTEROL SULFATE HFA 108 (90 BASE) MCG/ACT IN AERS: 2.0000 | INHALATION_SPRAY | Freq: Four times a day (QID) | RESPIRATORY_TRACT | 0 refills | Status: AC | PRN

## 2024-04-19 MED ORDER — SPIRONOLACTONE 25 MG PO TABS: 25.0000 mg | ORAL_TABLET | Freq: Two times a day (BID) | ORAL | 0 refills | Status: AC

## 2024-04-19 MED ORDER — APIXABAN 5 MG PO TABS: 5.0000 mg | ORAL_TABLET | Freq: Two times a day (BID) | ORAL | 0 refills | Status: AC

## 2024-04-19 MED ORDER — SENNOSIDES-DOCUSATE SODIUM 8.6-50 MG PO TABS: 1.0000 | ORAL_TABLET | Freq: Every day | ORAL | 0 refills | Status: AC

## 2024-04-19 MED ORDER — CEFDINIR 300 MG PO CAPS: 300.0000 mg | ORAL_CAPSULE | Freq: Two times a day (BID) | ORAL | 0 refills | Status: AC

## 2024-04-19 NOTE — TOC Progression Note (Signed)
 Transition of Care [facility]) - Progression Note    Patient Details  Name: Emma Morris MRN: [id number] Date of Birth: Jun 02, 1960  Transition of Care [facility]) CM/SW Contact  Lucie Lunger, [place] Phone Number: 04/19/2024, 10:18 AM  Clinical Narrative:    CSW notes per chart review that pt has home O2 with [employer]. CSW spoke to [employer] rep Jake who states that they have pt active with 2L O2 currently. TOC to follow.     Barriers to Discharge: Continued Medical Work up               Expected Discharge Plan and Services                                               Social Drivers of Health (SDOH) Interventions SDOH Screenings   Food Insecurity: No Food Insecurity (04/18/2024)  Housing: Low Risk  (04/18/2024)  Transportation Needs: No Transportation Needs (04/18/2024)  Utilities: Not At Risk (04/18/2024)  Financial Resource Strain: Medium Risk (11/25/2023)   Received from [facility]  Tobacco Use: Low Risk  (04/17/2024)    Readmission Risk Interventions    01/05/2023    1:17 PM 01/03/2023    1:30 PM 12/01/2022   10:47 AM  Readmission Risk Prevention Plan  Post Dischage Appt   Complete  Medication Screening   Complete  Transportation Screening Complete Complete Complete  PCP or Specialist Appt within 3-5 Days Not Complete Not Complete   HRI or Home Care Consult Complete    Social Work Consult for Recovery Care Planning/Counseling Complete    Palliative Care Screening Not Applicable Not Applicable   Medication Review Oceanographer) Complete Complete

## 2024-04-19 NOTE — Care Management Important Message (Signed)
 Important Message  Patient Details  Name: Emma Morris MRN: [id number] Date of Birth: January 30, 1961   Important Message Given:  N/A - LOS <3 / Initial given by admissions     Duwaine LITTIE Ada 04/19/2024, 4:14 PM

## 2024-04-19 NOTE — Discharge Summary (Signed)
 Physician Discharge Summary   Patient: [name] MRN: [id number] DOB: 12-05-1960  Admit date:     04/17/2024  Discharge date: 04/19/24  Discharge Physician: Bernardino KATHEE Come   PCP: Katrinka Aquas, MD   Recommendations at discharge:  Follow up with PCP in 1-2 weeks. Suggest repeat ambulatory pulse oximetry. Discharged requiring supplemental oxygen with exertion which the patient already has at home.   Discharge Diagnoses: Principal Problem:   CAP (community acquired pneumonia)  Hospital Course: ALICEN Morris is a 63 y.o. female with a history of HTN, history of PE on Eliquis , OHS, chronic HFpEF who presented to the emergency department due to shortness of breath.  She complained of about 4-day onset of irritation described as scratchiness in her throat, chest congestion, productive cough with clear sputum.  She went to an urgent care earlier today, checks x-ray was done and she was asked to go to the ED for further evaluation based on findings.  She denies fever, chills, headache, chest pain, abdominal pain.   ED course In the emergency department, she was tachypneic, tachycardic, BP was 146/85.  She was reported to become hypoxic in the ED per EDP and required supplemental oxygen to maintain O2 sat of 94%.  Workup in the ED showed normal CBC and BMP.  Troponin 20 > 21, magnesium 2.2.  Respiratory panel was negative. CT angiography of chest showed no pulmonary embolism but was suggestive of atypical pneumonia Chest x-ray was suggestive of erythema or pneumonia Breathing treatment was provided, she was started on IV ceftriaxone  and azithromycin , IV Solu-Medrol  125 mg x 1 was given, Imdur  and nitroglycerin  were given.  Hospital Course: Dyspnea significantly improved and she feels stable for discharge on 12/4 with plans to continue antibiotics and use her home oxygen while she continues to improve.   Assessment and Plan: CAP: - Augment antitussives.  - Continue antibiotics with  cefdinir  and azithromycin . CT features and reassuring PCT favor atypical organism. Legionella UAg pending.  - Monitor blood cultures and sputum culture. NGTD   Acute on chronic respiratory failure with hypoxia and hypercarbia: Still on supplemental oxygen with exertion though dyspnea has resolved. Still with cough. Feels she can safely go home. Note from June reviewed, with which I agree, stating patient likely has OHS, sleep disordered breathing, and this could ultimately lead to CHF, though is not currently volume overloaded.  - Continue antibiotics, standing lasix , PCP follow up.  - Continue home O2.  - Note no history of tobacco or Dx COPD, not wheezing.    Type II demand ischemia: Very mild troponin elevation which normalized x2. No chest pain whatsoever.    Chronic HFpEF, HTN: BNP negative. - Continue home lasix  w/K supplement, jardiance , diltiazem .  - Monitor volume status.     History of pulmonary embolism: None on CTA at admission.  - Continue eliquis , requested refill of this.    PAF: Currently in NSR by exam. ECG not released at this time.  - Continue home diltiazem , DOAC  Consultants: None Procedures performed: None  Disposition: Home Diet recommendation:  Cardiac diet DISCHARGE MEDICATION: Allergies as of 04/19/2024   No Known Allergies      Medication List     TAKE these medications    acetaminophen  500 MG tablet Commonly known as: TYLENOL  Take 1,000 mg by mouth every 6 (six) hours as needed for mild pain (pain score 1-3).   albuterol  108 (90 Base) MCG/ACT inhaler Commonly known as: VENTOLIN  HFA Inhale 2 puffs into the lungs  every 6 (six) hours as needed for wheezing or shortness of breath.   apixaban  5 MG Tabs tablet Commonly known as: Eliquis  Take 1 tablet (5 mg total) by mouth 2 (two) times daily.   azithromycin  250 MG tablet Commonly known as: ZITHROMAX  Take 1 tablet (250 mg total) by mouth daily for 3 doses.   cefdinir  300 MG capsule Commonly  known as: OMNICEF  Take 1 capsule (300 mg total) by mouth 2 (two) times daily for 3 days.   chlorpheniramine-HYDROcodone  10-8 MG/5ML Commonly known as: TUSSIONEX Take 5 mLs by mouth every 12 (twelve) hours as needed for cough.   cholecalciferol 25 MCG (1000 UNIT) tablet Commonly known as: VITAMIN D3 Take 1,000 Units by mouth daily.   diltiazem  180 MG 24 hr capsule Commonly known as: TIAZAC  Take 180 mg by mouth daily.   empagliflozin  10 MG Tabs tablet Commonly known as: Jardiance  Take 1 tablet (10 mg total) by mouth daily before breakfast.   fluticasone 50 MCG/ACT nasal spray Commonly known as: FLONASE Place 1 spray into both nostrils daily as needed for allergies.   furosemide  20 MG tablet Commonly known as: Lasix  Take 1 tablet (20 mg total) by mouth daily.   loratadine  10 MG tablet Commonly known as: CLARITIN  Take 10 mg by mouth daily.   potassium chloride  10 MEQ tablet Commonly known as: KLOR-CON  Take 1 tablet (10 mEq total) by mouth daily. Take While taking Lasix /furosemide    senna-docusate 8.6-50 MG tablet Commonly known as: Senokot-S Take 1 tablet by mouth daily. Start taking on: April 20, 2024   spironolactone  25 MG tablet Commonly known as: ALDACTONE  Take 1 tablet (25 mg total) by mouth 2 (two) times daily.   traMADol  50 MG tablet Commonly known as: ULTRAM  Take 1 tablet (50 mg total) by mouth every 6 (six) hours as needed. What changed: reasons to take this        Follow-up Information     Katrinka Aquas, MD Follow up.   Specialty: Internal Medicine Contact information: [address] [place] [address] [phone]         Catherine Cools, MD Follow up.   Specialties: Pulmonary Disease, Internal Medicine Contact information: [address], [address] [place] [address] [phone]                Discharge Exam: Emma Morris   04/18/24 1602 04/19/24 0304  Weight: (!) 136.5 kg (!) 137.2 kg  BP 117/78 (BP Location:  Left Wrist)   Pulse 86   Temp 97.7 F (36.5 C) (Oral)   Resp 19   Ht 5' 2 (1.575 m)   Wt (!) 137.2 kg   SpO2 98%   BMI 55.31 kg/m   Well-appearing obese female in no distress Clear, nonlabored RRR  Condition at discharge: stable  The results of significant diagnostics from this hospitalization (including imaging, microbiology, ancillary and laboratory) are listed below for reference.   Imaging Studies: DG Chest 2 View Result Date: 04/17/2024 EXAM: 2 VIEW(S) XRAY OF THE CHEST 04/17/2024 04:58:00 PM COMPARISON: 01/02/2023 CLINICAL HISTORY: bibasilar infiltrates present and costophrencic angles are clear FINDINGS: LUNGS AND PLEURA: Low lung volumes. Diffuse interstitial prominence airspace opacities with a perihilar predominance. Trace left pleural effusion. Retrocardiac airspace opacity. No pneumothorax. HEART AND MEDIASTINUM: Cardiomegaly. BONES AND SOFT TISSUES: No acute osseous abnormality. IMPRESSION: 1. Diffuse interstitial prominence and airspace opacities with perihilar predominance, including retrocardiac opacity. Findings may be due to edema or pneumonia. Electronically signed by: Norman Gatlin MD 04/17/2024 08:46  PM EST RP Workstation: [id number]   CT Angio Chest PE W and/or Wo Contrast Result Date: 04/17/2024 EXAM: CTA of the Chest with contrast for PE 04/17/2024 08:38:49 PM TECHNIQUE: CTA of the chest was performed without and with the administration of 100 mL of iohexol  (OMNIPAQUE ) 350 MG/ML injection. Multiplanar reformatted images are provided for review. MIP images are provided for review. Automated exposure control, iterative reconstruction, and/or weight based adjustment of the mA/kV was utilized to reduce the radiation dose to as low as reasonably achievable. COMPARISON: Same day x-ray and CT 11/27/2022. CLINICAL HISTORY: Pulmonary embolism (PE) suspected, high prob. FINDINGS: PULMONARY ARTERIES: Pulmonary arteries are adequately opacified for evaluation. Negative for  pulmonary embolism noting limited evaluation of the peripheral pulmonary artery branches due to motion artifact . Dilated main pulmonary artery measuring 39 mm which can be seen with pulmonary hypertension. MEDIASTINUM: Cardiomegaly. The heart and pericardium demonstrate no acute abnormality. There is no acute abnormality of the thoracic aorta. LYMPH NODES: No mediastinal, hilar or axillary lymphadenopathy. LUNGS AND PLEURA: Patchy bilateral ground glass opacities with a peribronchovascular distribution suggesting atypical pneumonia. No pleural effusion or pneumothorax. UPPER ABDOMEN: Cholelithiasis. Small hiatal hernia. Limited images of the upper abdomen are otherwise unremarkable. SOFT TISSUES AND BONES: No acute bone or soft tissue abnormality. IMPRESSION: 1. No pulmonary embolism. 2. Patchy bilateral ground glass opacities with a peribronchovascular distribution, suggesting atypical pneumonia. 3. Dilated main pulmonary artery measuring 39 mm, which can be seen with pulmonary hypertension. 4. Cardiomegaly. Electronically signed by: Norman Gatlin MD 04/17/2024 08:45 PM EST RP Workstation: [id number]    Microbiology: Results for orders placed or performed during the hospital encounter of 04/17/24  Resp panel by RT-PCR (RSV, Flu A&B, Covid) Anterior Nasal Swab     Status: None   Collection Time: 04/17/24  5:30 PM   Specimen: Anterior Nasal Swab  Result Value Ref Range Status   SARS Coronavirus 2 by RT PCR NEGATIVE NEGATIVE Final    Comment: (NOTE) SARS-CoV-2 target nucleic acids are NOT DETECTED.  The SARS-CoV-2 RNA is generally detectable in upper respiratory specimens during the acute phase of infection. The lowest concentration of SARS-CoV-2 viral copies this assay can detect is 138 copies/mL. A negative result does not preclude SARS-Cov-2 infection and should not be used as the sole basis for treatment or other patient management decisions. A negative result may occur with  improper specimen  collection/handling, submission of specimen other than nasopharyngeal swab, presence of viral mutation(s) within the areas targeted by this assay, and inadequate number of viral copies(<138 copies/mL). A negative result must be combined with clinical observations, patient history, and epidemiological information. The expected result is Negative.  Fact Sheet for Patients:  bloggercourse.com  Fact Sheet for Healthcare Providers:  seriousbroker.it  This test is no t yet approved or cleared by the [place]  FDA and  has been authorized for detection and/or diagnosis of SARS-CoV-2 by FDA under an Emergency Use Authorization (EUA). This EUA will remain  in effect (meaning this test can be used) for the duration of the COVID-19 declaration under Section 564(b)(1) of the Act, 21 U.S.C.section 360bbb-3(b)(1), unless the authorization is terminated  or revoked sooner.       Influenza A by PCR NEGATIVE NEGATIVE Final   Influenza B by PCR NEGATIVE NEGATIVE Final    Comment: (NOTE) The Xpert Xpress SARS-CoV-2/FLU/RSV plus assay is intended as an aid in the diagnosis of influenza from Nasopharyngeal swab specimens and should not be used as a sole basis for treatment.  Nasal washings and aspirates are unacceptable for Xpert Xpress SARS-CoV-2/FLU/RSV testing.  Fact Sheet for Patients: bloggercourse.com  Fact Sheet for Healthcare Providers: seriousbroker.it  This test is not yet approved or cleared by the [place]  FDA and has been authorized for detection and/or diagnosis of SARS-CoV-2 by FDA under an Emergency Use Authorization (EUA). This EUA will remain in effect (meaning this test can be used) for the duration of the COVID-19 declaration under Section 564(b)(1) of the Act, 21 U.S.C. section 360bbb-3(b)(1), unless the authorization is terminated or revoked.     Resp Syncytial  Virus by PCR NEGATIVE NEGATIVE Final    Comment: (NOTE) Fact Sheet for Patients: bloggercourse.com  Fact Sheet for Healthcare Providers: seriousbroker.it  This test is not yet approved or cleared by the [place]  FDA and has been authorized for detection and/or diagnosis of SARS-CoV-2 by FDA under an Emergency Use Authorization (EUA). This EUA will remain in effect (meaning this test can be used) for the duration of the COVID-19 declaration under Section 564(b)(1) of the Act, 21 U.S.C. section 360bbb-3(b)(1), unless the authorization is terminated or revoked.  Performed at [facility], [address] [place], [place] [address]   Culture, blood (routine x 2) Call MD if unable to obtain prior to antibiotics being given     Status: None (Preliminary result)   Collection Time: 04/18/24 12:14 AM   Specimen: BLOOD  Result Value Ref Range Status   Specimen Description BLOOD LEFT ANTECUBITAL  Final   Special Requests   Final    BOTTLES DRAWN AEROBIC AND ANAEROBIC Blood Culture adequate volume   Culture   Final    NO GROWTH 1 DAY Performed at [facility], [address] [place], [place] [address]    Report Status PENDING  Incomplete  Culture, blood (routine x 2) Call MD if unable to obtain prior to antibiotics being given     Status: None (Preliminary result)   Collection Time: 04/18/24 12:14 AM   Specimen: BLOOD  Result Value Ref Range Status   Specimen Description BLOOD BLOOD LEFT HAND  Final   Special Requests   Final    BOTTLES DRAWN AEROBIC AND ANAEROBIC Blood Culture adequate volume   Culture   Final    NO GROWTH 1 DAY Performed at [facility], [address], [place], [place] [address]    Report Status PENDING  Incomplete    Labs: CBC: Recent Labs  Lab 04/17/24 1757 04/18/24 0012  WBC 6.8 6.8  NEUTROABS 5.1  --   HGB 13.7 12.1  HCT 44.5 40.4  MCV 86.7 86.3  PLT 272 247   Basic Metabolic Panel: Recent Labs   Lab 04/17/24 1757 04/18/24 0012  NA 139 138  K 4.0 4.0  CL 99 98  CO2 24 26  GLUCOSE 79 86  BUN 12 10  CREATININE 0.85 0.75  CALCIUM 9.8 9.2  MG 2.2 2.2  PHOS  --  3.3   Liver Function Tests: Recent Labs  Lab 04/17/24 1757 04/18/24 0012  AST 23 16  ALT 13 12  ALKPHOS 78 69  BILITOT 0.6 0.4  PROT 10.0* 9.0*  ALBUMIN 4.4 4.0   CBG: No results for input(s): GLUCAP in the last 168 hours.  Discharge time spent: greater than 30 minutes.  Signed: Bernardino KATHEE Come, MD [facility] 04/19/2024

## 2024-04-19 NOTE — Plan of Care (Signed)
   Problem: Education: Goal: Knowledge of General Education information will improve Description Including pain rating scale, medication(s)/side effects and non-pharmacologic comfort measures Outcome: Progressing   Problem: Health Behavior/Discharge Planning: Goal: Ability to manage health-related needs will improve Outcome: Progressing

## 2024-04-19 NOTE — Plan of Care (Signed)

## 2024-04-20 ENCOUNTER — Encounter (HOSPITAL_BASED_OUTPATIENT_CLINIC_OR_DEPARTMENT_OTHER): Admitting: Internal Medicine

## 2024-04-23 LAB — CULTURE, BLOOD (ROUTINE X 2)
Culture: NO GROWTH
Culture: NO GROWTH
Special Requests: ADEQUATE
Special Requests: ADEQUATE

## 2024-04-24 ENCOUNTER — Ambulatory Visit (HOSPITAL_BASED_OUTPATIENT_CLINIC_OR_DEPARTMENT_OTHER): Admitting: Internal Medicine

## 2024-04-30 ENCOUNTER — Emergency Department (HOSPITAL_COMMUNITY)

## 2024-04-30 ENCOUNTER — Other Ambulatory Visit: Payer: Self-pay

## 2024-04-30 ENCOUNTER — Inpatient Hospital Stay (HOSPITAL_COMMUNITY)
Admission: EM | Admit: 2024-04-30 | Discharge: 2024-05-02 | DRG: 193 | Disposition: A | Attending: Internal Medicine | Admitting: Internal Medicine

## 2024-04-30 ENCOUNTER — Encounter (HOSPITAL_COMMUNITY): Payer: Self-pay

## 2024-04-30 DIAGNOSIS — Z1152 Encounter for screening for COVID-19: Secondary | ICD-10-CM | POA: Diagnosis not present

## 2024-04-30 DIAGNOSIS — I480 Paroxysmal atrial fibrillation: Secondary | ICD-10-CM | POA: Diagnosis present

## 2024-04-30 DIAGNOSIS — J9601 Acute respiratory failure with hypoxia: Secondary | ICD-10-CM

## 2024-04-30 DIAGNOSIS — I2489 Other forms of acute ischemic heart disease: Secondary | ICD-10-CM | POA: Diagnosis not present

## 2024-04-30 DIAGNOSIS — M1990 Unspecified osteoarthritis, unspecified site: Secondary | ICD-10-CM | POA: Diagnosis present

## 2024-04-30 DIAGNOSIS — J45909 Unspecified asthma, uncomplicated: Secondary | ICD-10-CM | POA: Diagnosis present

## 2024-04-30 DIAGNOSIS — J9621 Acute and chronic respiratory failure with hypoxia: Secondary | ICD-10-CM | POA: Diagnosis present

## 2024-04-30 DIAGNOSIS — R0602 Shortness of breath: Secondary | ICD-10-CM | POA: Diagnosis present

## 2024-04-30 DIAGNOSIS — I110 Hypertensive heart disease with heart failure: Secondary | ICD-10-CM | POA: Diagnosis present

## 2024-04-30 DIAGNOSIS — I272 Pulmonary hypertension, unspecified: Secondary | ICD-10-CM | POA: Diagnosis present

## 2024-04-30 DIAGNOSIS — Z79899 Other long term (current) drug therapy: Secondary | ICD-10-CM

## 2024-04-30 DIAGNOSIS — J189 Pneumonia, unspecified organism: Principal | ICD-10-CM | POA: Diagnosis present

## 2024-04-30 DIAGNOSIS — J9622 Acute and chronic respiratory failure with hypercapnia: Secondary | ICD-10-CM | POA: Diagnosis present

## 2024-04-30 DIAGNOSIS — J181 Lobar pneumonia, unspecified organism: Secondary | ICD-10-CM | POA: Diagnosis present

## 2024-04-30 DIAGNOSIS — Z7901 Long term (current) use of anticoagulants: Secondary | ICD-10-CM | POA: Diagnosis not present

## 2024-04-30 DIAGNOSIS — E662 Morbid (severe) obesity with alveolar hypoventilation: Secondary | ICD-10-CM | POA: Diagnosis present

## 2024-04-30 DIAGNOSIS — E66813 Obesity, class 3: Secondary | ICD-10-CM | POA: Diagnosis present

## 2024-04-30 DIAGNOSIS — Z86711 Personal history of pulmonary embolism: Secondary | ICD-10-CM | POA: Diagnosis not present

## 2024-04-30 DIAGNOSIS — Z6841 Body Mass Index (BMI) 40.0 and over, adult: Secondary | ICD-10-CM | POA: Diagnosis not present

## 2024-04-30 DIAGNOSIS — Z833 Family history of diabetes mellitus: Secondary | ICD-10-CM

## 2024-04-30 DIAGNOSIS — Z7984 Long term (current) use of oral hypoglycemic drugs: Secondary | ICD-10-CM | POA: Diagnosis not present

## 2024-04-30 DIAGNOSIS — Z8419 Family history of other disorders of kidney and ureter: Secondary | ICD-10-CM

## 2024-04-30 DIAGNOSIS — I5033 Acute on chronic diastolic (congestive) heart failure: Secondary | ICD-10-CM | POA: Diagnosis present

## 2024-04-30 LAB — CBC WITH DIFFERENTIAL/PLATELET
Abs Immature Granulocytes: 0.02 K/uL (ref 0.00–0.07)
Basophils Absolute: 0 K/uL (ref 0.0–0.1)
Basophils Relative: 1 %
Eosinophils Absolute: 0.2 K/uL (ref 0.0–0.5)
Eosinophils Relative: 4 %
HCT: 42.9 % (ref 36.0–46.0)
Hemoglobin: 12.7 g/dL (ref 12.0–15.0)
Immature Granulocytes: 0 %
Lymphocytes Relative: 19 %
Lymphs Abs: 1 K/uL (ref 0.7–4.0)
MCH: 25.9 pg — ABNORMAL LOW (ref 26.0–34.0)
MCHC: 29.6 g/dL — ABNORMAL LOW (ref 30.0–36.0)
MCV: 87.4 fL (ref 80.0–100.0)
Monocytes Absolute: 0.5 K/uL (ref 0.1–1.0)
Monocytes Relative: 11 %
Neutro Abs: 3.2 K/uL (ref 1.7–7.7)
Neutrophils Relative %: 65 %
Platelets: 254 K/uL (ref 150–400)
RBC: 4.91 MIL/uL (ref 3.87–5.11)
RDW: 15.6 % — ABNORMAL HIGH (ref 11.5–15.5)
WBC: 5 K/uL (ref 4.0–10.5)
nRBC: 0 % (ref 0.0–0.2)

## 2024-04-30 LAB — HEPATIC FUNCTION PANEL
ALT: 7 U/L (ref 0–44)
AST: 14 U/L — ABNORMAL LOW (ref 15–41)
Albumin: 4 g/dL (ref 3.5–5.0)
Alkaline Phosphatase: 64 U/L (ref 38–126)
Bilirubin, Direct: 0.1 mg/dL (ref 0.0–0.2)
Indirect Bilirubin: 0.2 mg/dL — ABNORMAL LOW (ref 0.3–0.9)
Total Bilirubin: 0.3 mg/dL (ref 0.0–1.2)
Total Protein: 8.6 g/dL — ABNORMAL HIGH (ref 6.5–8.1)

## 2024-04-30 LAB — RESP PANEL BY RT-PCR (RSV, FLU A&B, COVID)  RVPGX2
Influenza A by PCR: NEGATIVE
Influenza B by PCR: NEGATIVE
Resp Syncytial Virus by PCR: NEGATIVE
SARS Coronavirus 2 by RT PCR: NEGATIVE

## 2024-04-30 LAB — TROPONIN T, HIGH SENSITIVITY
Troponin T High Sensitivity: 26 ng/L — ABNORMAL HIGH (ref 0–19)
Troponin T High Sensitivity: 22 ng/L — ABNORMAL HIGH (ref 0–19)

## 2024-04-30 LAB — BASIC METABOLIC PANEL WITH GFR
Anion gap: 11 (ref 5–15)
BUN: 11 mg/dL (ref 8–23)
CO2: 26 mmol/L (ref 22–32)
Calcium: 9.1 mg/dL (ref 8.9–10.3)
Chloride: 100 mmol/L (ref 98–111)
Creatinine, Ser: 0.95 mg/dL (ref 0.44–1.00)
GFR, Estimated: >60 mL/min (ref >60)
Glucose, Bld: 95 mg/dL (ref 70–99)
Potassium: 4.1 mmol/L (ref 3.5–5.1)
Sodium: 138 mmol/L (ref 135–145)

## 2024-04-30 LAB — LACTIC ACID, PLASMA
Lactic Acid, Venous: 1.5 mmol/L (ref 0.5–1.9)
Lactic Acid, Venous: 1 mmol/L (ref 0.5–1.9)

## 2024-04-30 LAB — PRO BRAIN NATRIURETIC PEPTIDE: Pro Brain Natriuretic Peptide: <50 pg/mL (ref <300.0)

## 2024-04-30 MED ORDER — ACETAMINOPHEN 500 MG PO TABS
1000.0000 mg | ORAL_TABLET | Freq: Four times a day (QID) | ORAL | Status: DC | PRN
  Administered 2024-05-01 (×2): 1000 mg via ORAL
  Filled 2024-04-30 (×3): qty 2

## 2024-04-30 MED ORDER — SENNOSIDES-DOCUSATE SODIUM 8.6-50 MG PO TABS
1.0000 | ORAL_TABLET | Freq: Every day | ORAL | Status: DC
  Administered 2024-04-30 – 2024-05-02 (×3): 1 via ORAL
  Filled 2024-04-30 (×3): qty 1

## 2024-04-30 MED ORDER — SODIUM CHLORIDE 0.9 % IV BOLUS
500.0000 mL | Freq: Once | INTRAVENOUS | Status: AC
  Administered 2024-04-30: 13:00:00 500 mL via INTRAVENOUS

## 2024-04-30 MED ORDER — SODIUM CHLORIDE 0.9 % IV SOLN
2.0000 g | Freq: Once | INTRAVENOUS | Status: AC
  Administered 2024-04-30: 14:00:00 2 g via INTRAVENOUS
  Filled 2024-04-30: qty 20

## 2024-04-30 MED ORDER — IPRATROPIUM-ALBUTEROL 0.5-2.5 (3) MG/3ML IN SOLN: 3.0000 mL | Freq: Four times a day (QID) | RESPIRATORY_TRACT | Status: DC | PRN

## 2024-04-30 MED ORDER — DILTIAZEM HCL ER COATED BEADS 180 MG PO CP24
180.0000 mg | ORAL_CAPSULE | Freq: Every day | ORAL | Status: DC
  Administered 2024-05-01 – 2024-05-02 (×2): 180 mg via ORAL
  Filled 2024-04-30 (×2): qty 1

## 2024-04-30 MED ORDER — SODIUM CHLORIDE 0.9% FLUSH: 3.0000 mL | Freq: Once | INTRAVENOUS | Status: DC

## 2024-04-30 MED ORDER — FUROSEMIDE 20 MG PO TABS: 20.0000 mg | ORAL_TABLET | Freq: Every day | ORAL | Status: DC

## 2024-04-30 MED ORDER — ENOXAPARIN SODIUM 80 MG/0.8ML IJ SOSY: 65.0000 mg | PREFILLED_SYRINGE | INTRAMUSCULAR | Status: DC

## 2024-04-30 MED ORDER — SODIUM CHLORIDE 0.9 % IV SOLN
500.0000 mg | Freq: Once | INTRAVENOUS | Status: AC
  Administered 2024-04-30: 17:00:00 500 mg via INTRAVENOUS
  Filled 2024-04-30: qty 5

## 2024-04-30 MED ORDER — SODIUM CHLORIDE 0.9 % IV SOLN
1.0000 g | INTRAVENOUS | Status: DC
  Administered 2024-05-01 – 2024-05-02 (×2): 1 g via INTRAVENOUS
  Filled 2024-04-30 (×2): qty 10

## 2024-04-30 MED ORDER — SPIRONOLACTONE 25 MG PO TABS
25.0000 mg | ORAL_TABLET | Freq: Two times a day (BID) | ORAL | Status: DC
  Administered 2024-04-30 – 2024-05-02 (×4): 25 mg via ORAL
  Filled 2024-04-30 (×4): qty 1

## 2024-04-30 MED ORDER — EMPAGLIFLOZIN 10 MG PO TABS
10.0000 mg | ORAL_TABLET | Freq: Every day | ORAL | Status: DC
  Administered 2024-05-01 – 2024-05-02 (×2): 10 mg via ORAL
  Filled 2024-04-30 (×2): qty 1

## 2024-04-30 MED ORDER — SODIUM CHLORIDE 0.9 % IV SOLN
500.0000 mg | INTRAVENOUS | Status: DC
  Administered 2024-05-01: 17:00:00 500 mg via INTRAVENOUS
  Filled 2024-04-30: qty 5

## 2024-04-30 MED ORDER — APIXABAN 5 MG PO TABS
5.0000 mg | ORAL_TABLET | Freq: Two times a day (BID) | ORAL | Status: DC
  Administered 2024-04-30 – 2024-05-02 (×4): 5 mg via ORAL
  Filled 2024-04-30 (×4): qty 1

## 2024-04-30 NOTE — ED Triage Notes (Signed)
 Pt arrived via POV from home c/o recurrent SOB. Pt presents on room air with O2 Sats at 93%. Pt reports mildly-productive cough and Pt reports she has a fever last night of 102F. Pt reports taking OTC medications for her fever and presents afebrile in Triage with oral temp of 98.36F.

## 2024-04-30 NOTE — Progress Notes (Signed)
 PHARMACIST - PHYSICIAN COMMUNICATION  CONCERNING:  Enoxaparin  (Lovenox ) for DVT Prophylaxis    RECOMMENDATION: Patient was prescribed enoxaprin 40mg  q24 hours for VTE prophylaxis.   Filed Weights   04/30/24 1242  Weight: (!) 137 kg (302 lb)    Body mass index is 55.24 kg/m.  Estimated Creatinine Clearance: 81.2 mL/min (by C-G formula based on SCr of 0.95 mg/dL).   Based on [facility] policy patient is candidate for enoxaparin  0.5mg /kg TBW SQ every 24 hours based on BMI being >30.   DESCRIPTION: Pharmacy has adjusted enoxaparin  dose per [facility] policy.  Patient is now receiving enoxaparin  65 mg every 24 hours    Kayla JULIANNA Blew, PharmD Clinical Pharmacist  04/30/2024 5:26 PM

## 2024-04-30 NOTE — ED Provider Notes (Signed)
 [place] [facility] AT [facility] Provider Note   CSN: [id number] Arrival date & time: 04/30/24  1234     Patient presents with: Shortness of Breath   Emma Morris is a 63 y.o. female.  {Add pertinent medical, surgical, social history, OB history to YEP:67052} Patient has a history of asthma and hypertension.  She was recently in the hospital with multifocal pneumonia.  She was discharged about 13 days ago and has finished her antibiotics.  She states that for the last couple days she has had a fever over 102 and shortness of breath   Shortness of Breath      Prior to Admission medications  Medication Sig Start Date End Date Taking? Authorizing Provider  acetaminophen  (TYLENOL ) 500 MG tablet Take 1,000 mg by mouth every 6 (six) hours as needed for mild pain (pain score 1-3).    [provider]  albuterol  (VENTOLIN  HFA) 108 (90 Base) MCG/ACT inhaler Inhale 2 puffs into the lungs every 6 (six) hours as needed for wheezing or shortness of breath. 04/19/24   Bryn Bernardino NOVAK, MD  apixaban  (ELIQUIS ) 5 MG TABS tablet Take 1 tablet (5 mg total) by mouth 2 (two) times daily. 04/19/24   Bryn Bernardino NOVAK, MD  chlorpheniramine-HYDROcodone  (TUSSIONEX) 10-8 MG/5ML Take 5 mLs by mouth every 12 (twelve) hours as needed for cough. 04/19/24   Bryn Bernardino NOVAK, MD  cholecalciferol (VITAMIN D3) 25 MCG (1000 UNIT) tablet Take 1,000 Units by mouth daily.    [provider]  diltiazem  (TIAZAC ) 180 MG 24 hr capsule Take 180 mg by mouth daily. 03/20/24   [provider]  empagliflozin  (JARDIANCE ) 10 MG TABS tablet Take 1 tablet (10 mg total) by mouth daily before breakfast. 04/10/24   Debera Jayson MATSU, MD  fluticasone [facility]) 50 MCG/ACT nasal spray Place 1 spray into both nostrils daily as needed for allergies. 01/27/21   [provider]  furosemide  (LASIX ) 20 MG tablet Take 1 tablet (20 mg total) by mouth daily. 04/10/24 04/10/25  Debera Jayson MATSU,  MD  loratadine  (CLARITIN ) 10 MG tablet Take 10 mg by mouth daily.    [provider]  potassium chloride  (KLOR-CON ) 10 MEQ tablet Take 1 tablet (10 mEq total) by mouth daily. Take While taking Lasix /furosemide  01/08/23   Emokpae, Courage, MD  senna-docusate (SENOKOT-S) 8.6-50 MG tablet Take 1 tablet by mouth daily. 04/20/24   Bryn Bernardino NOVAK, MD  spironolactone  (ALDACTONE ) 25 MG tablet Take 1 tablet (25 mg total) by mouth 2 (two) times daily. 04/19/24   Bryn Bernardino NOVAK, MD  traMADol  (ULTRAM ) 50 MG tablet Take 1 tablet (50 mg total) by mouth every 6 (six) hours as needed. Patient taking differently: Take 50 mg by mouth every 6 (six) hours as needed for moderate pain (pain score 4-6). 03/28/24   Onesimo Oneil DELENA, MD    Allergies: Patient has no known allergies.    Review of Systems  Respiratory:  Positive for shortness of breath.     Updated Vital Signs BP (!) 125/106   Pulse 72   Temp 98.6 F (37 C)   Resp (!) 24   Ht 5' 2 (1.575 m)   Wt (!) 137 kg   SpO2 99%   BMI 55.24 kg/m   Physical Exam  (all labs ordered are listed, but only abnormal results are displayed) Labs Reviewed  CBC WITH DIFFERENTIAL/PLATELET - Abnormal; Notable for the following components:      Result Value   MCH 25.9 (*)  MCHC 29.6 (*)    RDW 15.6 (*)    All other components within normal limits  HEPATIC FUNCTION PANEL - Abnormal; Notable for the following components:   Total Protein 8.6 (*)    AST 14 (*)    Indirect Bilirubin 0.2 (*)    All other components within normal limits  TROPONIN T, HIGH SENSITIVITY - Abnormal; Notable for the following components:   Troponin T High Sensitivity 26 (*)    All other components within normal limits  TROPONIN T, HIGH SENSITIVITY - Abnormal; Notable for the following components:   Troponin T High Sensitivity 22 (*)    All other components within normal limits  RESP PANEL BY RT-PCR (RSV, FLU A&B, COVID)  [id number]  CULTURE, BLOOD (ROUTINE X 2)  CULTURE, BLOOD  (ROUTINE X 2)  BASIC METABOLIC PANEL WITH GFR  LACTIC ACID, PLASMA  LACTIC ACID, PLASMA  PRO BRAIN NATRIURETIC PEPTIDE    EKG: None  Radiology: CT Chest Wo Contrast Result Date: 04/30/2024 EXAM: CT CHEST WITHOUT CONTRAST 04/30/2024 03:53:59 PM TECHNIQUE: CT of the chest was performed without the administration of intravenous contrast. Multiplanar reformatted images are provided for review. Automated exposure control, iterative reconstruction, and/or weight based adjustment of the mA/kV was utilized to reduce the radiation dose to as low as reasonably achievable. COMPARISON: CT angiogram chest 04/17/2024. CLINICAL HISTORY: Dyspnea, chronic, chest wall or pleura disease suspected. FINDINGS: MEDIASTINUM: The heart is mildly enlarged, unchanged. The main pulmonary artery remains enlarged compatible with pulmonary artery hypertension. There is a small hiatal hernia. LYMPH NODES: No mediastinal, hilar or axillary lymphadenopathy. LUNGS AND PLEURA: Multifocal patchy areas of ground glass and interstitial opacities in the bilateral lower lobes and bilateral upper lobes similar to the prior examination. Central peribronchial wall thickening persists. There is no new focal lung infiltrate. No pleural effusion or pneumothorax. SOFT TISSUES/BONES: No acute abnormality of the bones or soft tissues. UPPER ABDOMEN: Limited images of the upper abdomen demonstrates no acute abnormality. IMPRESSION: 1. Unchanged multifocal patchy ground-glass and interstitial opacities bilaterally worrisome for atypical pneumonia. 2. Persistent central peribronchial wall thickening. 3. No new focal lung infiltrate, pleural effusion, or pneumothorax. 4. Mild cardiomegaly, unchanged. 5. Enlarged main pulmonary artery compatible with pulmonary hypertension. 6. Small hiatal hernia. Electronically signed by: Greig Pique MD 04/30/2024 04:04 PM EST RP Workstation: [id number]   DG Chest 2 View Result Date: 04/30/2024 EXAM: 2 VIEW(S) XRAY OF  THE CHEST 04/30/2024 01:03:00 PM COMPARISON: 04/17/2024 CLINICAL HISTORY: SOB FINDINGS: LUNGS AND PLEURA: Pulmonary interstitial prominence with mild pulmonary edema. Low lung volumes. Retrocardiac airspace opacity. Small left pleural effusion. No pneumothorax. HEART AND MEDIASTINUM: Cardiomegaly. BONES AND SOFT TISSUES: No acute osseous abnormality. IMPRESSION: 1. Pulmonary interstitial prominence and airspace opacities, slightly improved from prior. Suggestive of pulmonary edema versus multifocal pneumonia. 2. Low lung volumes. 3. Cardiomegaly. Electronically signed by: Donnice Mania MD 04/30/2024 01:32 PM EST RP Workstation: [id number]    {Document cardiac monitor, telemetry assessment procedure when appropriate:32947} Procedures   Medications Ordered in the ED  sodium chloride  flush (NS) 0.9 % injection 3 mL (3 mLs Intravenous Not Given 04/30/24 1420)  azithromycin  (ZITHROMAX ) 500 mg in sodium chloride  0.9 % 250 mL IVPB (has no administration in time range)  sodium chloride  0.9 % bolus 500 mL (0 mLs Intravenous Stopped 04/30/24 1405)  cefTRIAXone  (ROCEPHIN ) 2 g in sodium chloride  0.9 % 100 mL IVPB (0 g Intravenous Stopped 04/30/24 1434)     Patient was hypoxic on room air at 87%. {Click here for  ABCD2, HEART and other calculators REFRESH Note before signing:1}                              Medical Decision Making Amount and/or Complexity of Data Reviewed Labs: ordered. Radiology: ordered.   Patient with multifocal pneumonia.  She will be readmitted for the hypoxia  {Document critical care time when appropriate  Document review of labs and clinical decision tools ie CHADS2VASC2, etc  Document your independent review of radiology images and any outside records  Document your discussion with family members, caretakers and with consultants  Document social determinants of health affecting pt's care  Document your decision making why or why not admission, treatments were needed:32947:::1}    Final diagnoses:  None    ED Discharge Orders     None

## 2024-04-30 NOTE — ED Notes (Signed)
 Pt returned from CT

## 2024-04-30 NOTE — ED Notes (Signed)
 Transporter called for transport pt to room

## 2024-04-30 NOTE — H&P (Signed)
**Note Emma-Identified via Obfuscation**  History and Physical    Patient: Emma Morris FMW:[id number] DOB: 02-26-1961 DOA: 04/30/2024 DOS: the patient was seen and examined on 04/30/2024 PCP: Katrinka Aquas, MD  Patient coming from: Home  Chief Complaint: Shortness of breath Chief Complaint  Patient presents with   Shortness of Breath   HPI:  Emma Morris is a 63 y.o. female with medical history significant of asthma, diastolic congestive heart failure, pulmonary embolism on Eliquis , hypertension, arthritis, OHS, who was recently admitted here from 04/17/2024 to 04/19/2024 and managed for community-acquired pneumonia and sent home with oral antibiotics.  Patient presents again with fever up to 102 as well As Shortness of Breath with Low Saturation of 87% upon Arrival to the Emergency Room.  Patient denied orthopnea, chest pain, PND, nausea vomiting abdominal pain.  She has associated cough that is nonproductive.  ED course: Upon arrival to the emergency room temperature 98.6, respiratory rate 24, blood pressure 146/84 saturating 87% on room air.  CT scan of the chest showing multifocal infiltrate concerning for atypical pneumonia. Given above concerns hospitalist service was contacted to admit patient for further management   Review of Systems: As mentioned in the history of present illness. All other systems reviewed and are negative.  Past Medical History:  Diagnosis Date   Anemia    Arthritis    Asthma    Hypertension    Knee pain    Obesity hypoventilation syndrome [facility])    Pulmonary embolus [facility])    Diagnosed July 2024   Respiratory failure with hypoxia [facility])    Past Surgical History:  Procedure Laterality Date   CESAREAN SECTION     COLONOSCOPY WITH PROPOFOL  N/A 11/09/2021   Procedure: COLONOSCOPY WITH PROPOFOL ;  Surgeon: Cindie Carlin POUR, DO;  Location: [facility];  Service: Endoscopy;  Laterality: N/A;  11:00am   DILITATION & CURRETTAGE/HYSTROSCOPY WITH THERMACHOICE ABLATION N/A 05/04/2013    Procedure: DILATATION & CURETTAGE/HYSTEROSCOPY WITH THERMACHOICE ABLATION;  Surgeon: Vonn VEAR Inch, MD;  Location: [facility] ORS;  Service: Gynecology;  Laterality: N/A;  temperature:87 degrees; total therapy time:14 minutes and 5 seconds   ESOPHAGOGASTRODUODENOSCOPY (EGD) WITH PROPOFOL  N/A 11/09/2021   Procedure: ESOPHAGOGASTRODUODENOSCOPY (EGD) WITH PROPOFOL ;  Surgeon: Cindie Carlin POUR, DO;  Location: [facility];  Service: Endoscopy;  Laterality: N/A;   TONSILLECTOMY     Social History:  reports that she has never smoked. She has never been exposed to tobacco smoke. She has never used smokeless tobacco. She reports that she does not drink alcohol and does not use drugs.  Allergies[1]  Family History  Problem Relation Age of Onset   Cancer Mother    Kidney disease Father    Diabetes Father    Colon cancer Neg Hx     Prior to Admission medications  Medication Sig Start Date End Date Taking? Authorizing Provider  acetaminophen  (TYLENOL ) 500 MG tablet Take 1,000 mg by mouth every 6 (six) hours as needed for mild pain (pain score 1-3).    [provider]  albuterol  (VENTOLIN  HFA) 108 (90 Base) MCG/ACT inhaler Inhale 2 puffs into the lungs every 6 (six) hours as needed for wheezing or shortness of breath. 04/19/24   Bryn Bernardino NOVAK, MD  apixaban  (ELIQUIS ) 5 MG TABS tablet Take 1 tablet (5 mg total) by mouth 2 (two) times daily. 04/19/24   Bryn Bernardino NOVAK, MD  chlorpheniramine-HYDROcodone  (TUSSIONEX) 10-8 MG/5ML Take 5 mLs by mouth every 12 (twelve) hours as needed for cough. 04/19/24   Bryn Bernardino NOVAK, MD  cholecalciferol (VITAMIN D3) 25 MCG (1000 UNIT) tablet Take 1,000 Units by mouth daily.    [provider]  diltiazem  (TIAZAC ) 180 MG 24 hr capsule Take 180 mg by mouth daily. 03/20/24   [provider]  empagliflozin  (JARDIANCE ) 10 MG TABS tablet Take 1 tablet (10 mg total) by mouth daily before breakfast. 04/10/24   Debera Jayson MATSU, MD  fluticasone [facility]) 50 MCG/ACT nasal  spray Place 1 spray into both nostrils daily as needed for allergies. 01/27/21   [provider]  furosemide  (LASIX ) 20 MG tablet Take 1 tablet (20 mg total) by mouth daily. 04/10/24 04/10/25  Debera Jayson MATSU, MD  loratadine  (CLARITIN ) 10 MG tablet Take 10 mg by mouth daily.    [provider]  potassium chloride  (KLOR-CON ) 10 MEQ tablet Take 1 tablet (10 mEq total) by mouth daily. Take While taking Lasix /furosemide  01/08/23   Emokpae, Courage, MD  senna-docusate (SENOKOT-S) 8.6-50 MG tablet Take 1 tablet by mouth daily. 04/20/24   Bryn Bernardino NOVAK, MD  spironolactone  (ALDACTONE ) 25 MG tablet Take 1 tablet (25 mg total) by mouth 2 (two) times daily. 04/19/24   Bryn Bernardino NOVAK, MD  traMADol  (ULTRAM ) 50 MG tablet Take 1 tablet (50 mg total) by mouth every 6 (six) hours as needed. Patient taking differently: Take 50 mg by mouth every 6 (six) hours as needed for moderate pain (pain score 4-6). 03/28/24   Onesimo Oneil LABOR, MD    Physical Exam: Vitals:   04/30/24 1630 04/30/24 1633 04/30/24 1636 04/30/24 1645  BP: 125/79     Pulse: 72  76 79  Resp:   20 20  Temp:  98 F (36.7 C)    TempSrc:  Oral    SpO2: 96%  98% 97%  Weight:      Height:       General - Elderly  female, morbidly obese in some respiratory distress requiring intranasal oxygen HEENT - PERRLA, EOMI, atraumatic head, non tender sinuses. Lung -decreased air entry bilaterally especially at the bases Heart - S1, S2 heard, no murmurs, rubs, no pedal edema. Abdomen - Soft, non tender, bowel sounds good Neuro - Alert, awake and oriented x 3, non focal exam. Skin - Warm and dry.  Data Reviewed:     Latest Ref Rng & Units 04/30/2024    1:13 PM 04/18/2024   12:12 AM 04/17/2024    5:57 PM  CBC  WBC 4.0 - 10.5 K/uL 5.0  6.8  6.8   Hemoglobin 12.0 - 15.0 g/dL 87.2  87.8  86.2   Hematocrit 36.0 - 46.0 % 42.9  40.4  44.5   Platelets 150 - 400 K/uL 254  247  272        Latest Ref Rng & Units 04/30/2024    1:13 PM  04/18/2024   12:12 AM 04/17/2024    5:57 PM  BMP  Glucose 70 - 99 mg/dL 95  86  79   BUN 8 - 23 mg/dL 11  10  12    Creatinine 0.44 - 1.00 mg/dL 9.04  9.24  9.14   Sodium 135 - 145 mmol/L 138  138  139   Potassium 3.5 - 5.1 mmol/L 4.1  4.0  4.0   Chloride 98 - 111 mmol/L 100  98  99   CO2 22 - 32 mmol/L 26  26  24    Calcium 8.9 - 10.3 mg/dL 9.1  9.2  9.8      Assessment and Plan:  Acute hypoxic respiratory  failure secondary to community-acquired pneumonia Continue to monitor saturation I have requested 24 respiratory panel Obtain mycoplasma as well as Legionella Place on ceftriaxone  and azithromycin  Place on incentive spirometer  Chronic asthma Continue as needed nebulization  Hypertension  Monitor blood pressure closely Continue current antihypertensives  History of PE on Eliquis  Continue Eliquis  as well as Cardizem   Chronic diastolic congestive heart failure Currently not in acute exacerbation Continue heart healthy diet Continue Lasix , spironolactone  and Jardiance  Monitor input and output as well as daily weight  Arthritis Continue as needed Tylenol   OHS Monitor saturation  DVT prophylaxis-continue Eliquis    Advance Care Planning:   Code Status: Prior full code  Consults: None  Family Communication: None at bedside  Severity of Illness: The appropriate patient status for this patient is INPATIENT. Inpatient status is judged to be reasonable and necessary in order to provide the required intensity of service to ensure the patient's safety. The patient's presenting symptoms, physical exam findings, and initial radiographic and laboratory data in the context of their chronic comorbidities is felt to place them at high risk for further clinical deterioration. Furthermore, it is not anticipated that the patient will be medically stable for discharge from the hospital within 2 midnights of admission.   * I certify that at the point of admission it is my clinical  judgment that the patient will require inpatient hospital care spanning beyond 2 midnights from the point of admission due to high intensity of service, high risk for further deterioration and high frequency of surveillance required.*  Author: Drue ONEIDA Potter, MD 04/30/2024 5:13 PM  For on call review [email or web address].      [1] No Known Allergies

## 2024-04-30 NOTE — ED Notes (Signed)
 Pt's RA sats decrease to 86%, O2 applied via [place] at 2 L/M, sats increased to 96%

## 2024-04-30 NOTE — ED Notes (Signed)
 Patient transported to CT

## 2024-05-01 DIAGNOSIS — E66813 Obesity, class 3: Secondary | ICD-10-CM | POA: Insufficient documentation

## 2024-05-01 DIAGNOSIS — I5033 Acute on chronic diastolic (congestive) heart failure: Secondary | ICD-10-CM | POA: Insufficient documentation

## 2024-05-01 DIAGNOSIS — J181 Lobar pneumonia, unspecified organism: Secondary | ICD-10-CM

## 2024-05-01 LAB — BASIC METABOLIC PANEL WITH GFR
Anion gap: 4 — ABNORMAL LOW (ref 5–15)
BUN: 9 mg/dL (ref 8–23)
CO2: 34 mmol/L — ABNORMAL HIGH (ref 22–32)
Calcium: 9 mg/dL (ref 8.9–10.3)
Chloride: 102 mmol/L (ref 98–111)
Creatinine, Ser: 0.88 mg/dL (ref 0.44–1.00)
GFR, Estimated: >60 mL/min (ref >60)
Glucose, Bld: 88 mg/dL (ref 70–99)
Potassium: 4.3 mmol/L (ref 3.5–5.1)
Sodium: 139 mmol/L (ref 135–145)

## 2024-05-01 LAB — RESPIRATORY PANEL BY PCR

## 2024-05-01 LAB — CBC
HCT: 39.1 % (ref 36.0–46.0)
Hemoglobin: 11.7 g/dL — ABNORMAL LOW (ref 12.0–15.0)
MCH: 25.9 pg — ABNORMAL LOW (ref 26.0–34.0)
MCHC: 29.9 g/dL — ABNORMAL LOW (ref 30.0–36.0)
MCV: 86.7 fL (ref 80.0–100.0)
Platelets: 234 K/uL (ref 150–400)
RBC: 4.51 MIL/uL (ref 3.87–5.11)
RDW: 15.6 % — ABNORMAL HIGH (ref 11.5–15.5)
WBC: 4.7 K/uL (ref 4.0–10.5)
nRBC: 0 % (ref 0.0–0.2)

## 2024-05-01 LAB — PROCALCITONIN: Procalcitonin: <0.1 ng/mL

## 2024-05-01 MED ORDER — FUROSEMIDE 10 MG/ML IJ SOLN
40.0000 mg | Freq: Once | INTRAMUSCULAR | Status: AC
  Administered 2024-05-01: 09:00:00 40 mg via INTRAVENOUS
  Filled 2024-05-01: qty 4

## 2024-05-01 NOTE — Hospital Course (Addendum)
 63 year old female with a history of hypertension, pulmonary embolus on apixaban , OHS, paroxysmal atrial fibrillation, HFpEF and morbid obesity presenting with subjective fevers, chest congestion and shortness of breath.  The patient was recently mated to the hospital from 04/17/2024 to 04/19/2024 when she was treated for pneumonia.  She was discharged home with cefdinir  and azithromycin  which she finished.  She states that she felt relatively better but in the past 2 to 3 days she has had worsening chest congestion and some shortness of breath.  She has noted some slight worsening of her lower extremity edema.  She states that she has had to sleep in a recliner for the past 2 to 3 days because of worsening cough when she lays flat. She denies any nausea, vomiting or diarrhea, abdominal pain.  There is no hematochezia or melena.  In the ED, the patient was afebrile and hemodynamically stable with oxygen saturation 86% room air.  WBC 5.0, hemoglobin 12.7, platelets 254.  Sodium 138, potassium 4.1, bicarbonate 26, serum creatinine 0.95.  LFTs were unremarkable.  Lactic acid 1.5>> 1.0.  Troponin 26>> 22.  CT chest showed multifocal patchy GGO and interstitial opacities bilateral lower lobe and bilateral upper lobes.  There was central peribronchial wall thickening.  There is no large pulmonary artery.  The patient was started on ceftriaxone  and azithromycin .

## 2024-05-01 NOTE — TOC CM/SW Note (Signed)
 Transition of Care [facility]) - Inpatient Brief Assessment   Patient Details  Name: Emma Morris MRN: [id number] Date of Birth: Apr 26, 1961  Transition of Care [facility]) CM/SW Contact:    Lucie Lunger, LCSWA Phone Number: 05/01/2024, 11:22 AM   Clinical Narrative: Transition of Care Department [facility]) has reviewed patient and no TOC needs have been identified at this time. We will continue to monitor patient advancement through interdiciplinary progression rounds. If new patient transition needs arise, please place a TOC consult.  Transition of Care Asessment: Insurance and Status: Insurance coverage has been reviewed Patient has primary care physician: Yes Home environment has been reviewed: Lives with son Prior level of function:: Independent Prior/Current Home Services: No current home services Social Drivers of Health Review: SDOH reviewed no interventions necessary Readmission risk has been reviewed: Yes Transition of care needs: no transition of care needs at this time

## 2024-05-01 NOTE — Progress Notes (Signed)
 PROGRESS NOTE  Emma Morris FMW:[id number] DOB: [birth year] DOA: 04/30/2024 PCP: Katrinka Aquas, MD  Brief History:  63 year old female with a history of hypertension, pulmonary embolus on apixaban , OHS, paroxysmal atrial fibrillation, HFpEF and morbid obesity presenting with subjective fevers, chest congestion and shortness of breath.  The patient was recently mated to the hospital from 04/17/2024 to 04/19/2024 when she was treated for pneumonia.  She was discharged home with cefdinir  and azithromycin  which she finished.  She states that she felt relatively better but in the past 2 to 3 days she has had worsening chest congestion and some shortness of breath.  She has noted some slight worsening of her lower extremity edema.  She states that she has had to sleep in a recliner for the past 2 to 3 days because of worsening cough when she lays flat. She denies any nausea, vomiting or diarrhea, abdominal pain.  There is no hematochezia or melena.  In the ED, the patient was afebrile and hemodynamically stable with oxygen saturation 86% room air.  WBC 5.0, hemoglobin 12.7, platelets 254.  Sodium 138, potassium 4.1, bicarbonate 26, serum creatinine 0.95.  LFTs were unremarkable.  Lactic acid 1.5>> 1.0.  Troponin 26>> 22.  CT chest showed multifocal patchy GGO and interstitial opacities bilateral lower lobe and bilateral upper lobes.  There was central peribronchial wall thickening.  There is no large pulmonary artery.  The patient was started on ceftriaxone  and azithromycin .   Assessment/Plan: Acute on chronic respiratory failure with hypoxia and hypercarbia - Oxygen saturation 86% on room air with dyspnea - Stable on 2 L with saturation up to 95% - She states that she uses oxygen as needed at home, 2 L  Acute on chronic HFpEF - I feel the patient has a degree of volume overload - She has been afebrile with WBC 5.0 presentation - Give spot dose Lasix  and reassess 05/02/2024 - Repeat  echo - 01/04/2023 echo EF 65 to 70%, no WMA, RV overload - Continue Jardiance   Pulmonary infiltrates/pneumonia - COVID-19/RSV/flu--negative - Viral respiratory panel pending - Continue ceftriaxone  and azithromycin  for now  Paroxysmal atrial fibrillation - Currently in sinus rhythm - Continue apixaban  - Continue Cardizem  CD  History of pulmonary embolus - Continue apixaban   OHS - She is currently following pulmonary medicine - Recently had sleep study--waiting for set up for NIV at home  Morbid obesity - BMI 57.01 - Lifestyle modification  Elevated troponin - Secondary to demand ischemia - Echocardiac was discussed above - Troponin 26>>> 22        Family Communication:  no Family at bedside  Consultants:  none  Code Status:  FULL   DVT Prophylaxis: apixaban    Procedures: As Listed in Progress Note Above  Antibiotics: Ceftriaxone  12/15>> Azithro 12/15>>      Subjective: Patient states that her breathing is little better than yesterday.  She denies any chest pain, mopped assist, nausea, vomiting, diarrhea, abdominal pain.  There is no hematochezia or melena.  Objective: Vitals:   04/30/24 1930 04/30/24 2329 05/01/24 0412 05/01/24 0550  BP: 114/74 (!) 109/57 117/67   Pulse: 70 77 73   Resp: 20 20 20    Temp: 98.6 F (37 C) 98.6 F (37 C) 98.2 F (36.8 C)   TempSrc: Oral Oral Oral   SpO2: 96% 96% 97%   Weight: (!) 136.6 kg   (!) 136.9 kg  Height: 5' 1 (1.549 m)  Intake/Output Summary (Last 24 hours) at 05/01/2024 0706 Last data filed at 05/01/2024 0245 Gross per 24 hour  Intake 873.21 ml  Output 250 ml  Net 623.21 ml   Weight change:  Exam:  General:  Pt is alert, follows commands appropriately, not in acute distress HEENT: No icterus, No thrush, No neck mass, [place]/AT Cardiovascular: RRR, S1/S2, no rubs, no gallops Respiratory: Bilateral crackles.  No wheezing.  Good air movement Abdomen: Soft/+BS, non tender, non distended, no  guarding Extremities: 1 + LE edema, No lymphangitis, No petechiae, No rashes, no synovitis   Data Reviewed: I have personally reviewed following labs and imaging studies Basic Metabolic Panel: Recent Labs  Lab 04/30/24 1313 05/01/24 0526  NA 138 139  K 4.1 4.3  CL 100 102  CO2 26 34*  GLUCOSE 95 88  BUN 11 9  CREATININE 0.95 0.88  CALCIUM 9.1 9.0   Liver Function Tests: Recent Labs  Lab 04/30/24 1320  AST 14*  ALT 7  ALKPHOS 64  BILITOT 0.3  PROT 8.6*  ALBUMIN 4.0   No results for input(s): LIPASE, AMYLASE in the last 168 hours. No results for input(s): AMMONIA in the last 168 hours. Coagulation Profile: No results for input(s): INR, PROTIME in the last 168 hours. CBC: Recent Labs  Lab 04/30/24 1313 05/01/24 0526  WBC 5.0 4.7  NEUTROABS 3.2  --   HGB 12.7 11.7*  HCT 42.9 39.1  MCV 87.4 86.7  PLT 254 234   Cardiac Enzymes: No results for input(s): CKTOTAL, CKMB, CKMBINDEX, TROPONINI in the last 168 hours. BNP: Invalid input(s): POCBNP CBG: No results for input(s): GLUCAP in the last 168 hours. HbA1C: No results for input(s): HGBA1C in the last 72 hours. Urine analysis:    Component Value Date/Time   COLORURINE AMBER (A) 11/28/2022 1219   APPEARANCEUR HAZY (A) 11/28/2022 1219   LABSPEC >1.046 (H) 11/28/2022 1219   PHURINE 5.0 11/28/2022 1219   GLUCOSEU NEGATIVE 11/28/2022 1219   HGBUR NEGATIVE 11/28/2022 1219   BILIRUBINUR NEGATIVE 11/28/2022 1219   KETONESUR NEGATIVE 11/28/2022 1219   PROTEINUR 30 (A) 11/28/2022 1219   UROBILINOGEN 0.2 05/02/2013 0321   NITRITE NEGATIVE 11/28/2022 1219   LEUKOCYTESUR NEGATIVE 11/28/2022 1219   Sepsis Labs: @LABRCNTIP (procalcitonin:4,lacticidven:4) ) Recent Results (from the past 240 hours)  Resp panel by RT-PCR (RSV, Flu A&B, Covid) Anterior Nasal Swab     Status: None   Collection Time: 04/30/24  1:24 PM   Specimen: Anterior Nasal Swab  Result Value Ref Range Status   SARS  Coronavirus 2 by RT PCR NEGATIVE NEGATIVE Final    Comment: (NOTE) SARS-CoV-2 target nucleic acids are NOT DETECTED.  The SARS-CoV-2 RNA is generally detectable in upper respiratory specimens during the acute phase of infection. The lowest concentration of SARS-CoV-2 viral copies this assay can detect is 138 copies/mL. A negative result does not preclude SARS-Cov-2 infection and should not be used as the sole basis for treatment or other patient management decisions. A negative result may occur with  improper specimen collection/handling, submission of specimen other than nasopharyngeal swab, presence of viral mutation(s) within the areas targeted by this assay, and inadequate number of viral copies(<138 copies/mL). A negative result must be combined with clinical observations, patient history, and epidemiological information. The expected result is Negative.  Fact Sheet for Patients:  bloggercourse.com  Fact Sheet for Healthcare Providers:  seriousbroker.it  This test is no t yet approved or cleared by the [place]  FDA and  has been authorized for  detection and/or diagnosis of SARS-CoV-2 by FDA under an Emergency Use Authorization (EUA). This EUA will remain  in effect (meaning this test can be used) for the duration of the COVID-19 declaration under Section 564(b)(1) of the Act, 21 U.S.C.section 360bbb-3(b)(1), unless the authorization is terminated  or revoked sooner.       Influenza A by PCR NEGATIVE NEGATIVE Final   Influenza B by PCR NEGATIVE NEGATIVE Final    Comment: (NOTE) The Xpert Xpress SARS-CoV-2/FLU/RSV plus assay is intended as an aid in the diagnosis of influenza from Nasopharyngeal swab specimens and should not be used as a sole basis for treatment. Nasal washings and aspirates are unacceptable for Xpert Xpress SARS-CoV-2/FLU/RSV testing.  Fact Sheet for  Patients: bloggercourse.com  Fact Sheet for Healthcare Providers: seriousbroker.it  This test is not yet approved or cleared by the [place]  FDA and has been authorized for detection and/or diagnosis of SARS-CoV-2 by FDA under an Emergency Use Authorization (EUA). This EUA will remain in effect (meaning this test can be used) for the duration of the COVID-19 declaration under Section 564(b)(1) of the Act, 21 U.S.C. section 360bbb-3(b)(1), unless the authorization is terminated or revoked.     Resp Syncytial Virus by PCR NEGATIVE NEGATIVE Final    Comment: (NOTE) Fact Sheet for Patients: bloggercourse.com  Fact Sheet for Healthcare Providers: seriousbroker.it  This test is not yet approved or cleared by the [place]  FDA and has been authorized for detection and/or diagnosis of SARS-CoV-2 by FDA under an Emergency Use Authorization (EUA). This EUA will remain in effect (meaning this test can be used) for the duration of the COVID-19 declaration under Section 564(b)(1) of the Act, 21 U.S.C. section 360bbb-3(b)(1), unless the authorization is terminated or revoked.  Performed at [facility], [address], [place], [place] [address]   Blood culture (routine x 2)     Status: None (Preliminary result)   Collection Time: 04/30/24  1:49 PM   Specimen: BLOOD  Result Value Ref Range Status   Specimen Description BLOOD LEFT HAND  Final   Special Requests   Final    BOTTLES DRAWN AEROBIC ONLY Blood Culture results may not be optimal due to an inadequate volume of blood received in culture bottles   Culture   Final    NO GROWTH < 24 HOURS Performed at [facility], [address], [place], [place] [address]    Report Status PENDING  Incomplete  Blood culture (routine x 2)     Status: None (Preliminary result)   Collection Time: 04/30/24  2:01 PM   Specimen: BLOOD  Result  Value Ref Range Status   Specimen Description BLOOD BLOOD RIGHT ARM  Final   Special Requests   Final    Blood Culture adequate volume BOTTLES DRAWN AEROBIC AND ANAEROBIC   Culture   Final    NO GROWTH < 24 HOURS Performed at [facility], [address] [place], [place] [address]    Report Status PENDING  Incomplete     Scheduled Meds:  apixaban   5 mg Oral BID   diltiazem   180 mg Oral Daily   empagliflozin   10 mg Oral QAC breakfast   furosemide   20 mg Oral Daily   senna-docusate  1 tablet Oral Daily   sodium chloride  flush  3 mL Intravenous Once   spironolactone   25 mg Oral BID   Continuous Infusions:  azithromycin      cefTRIAXone  (ROCEPHIN )  IV      Procedures/Studies: CT Chest Wo  Contrast Result Date: 04/30/2024 EXAM: CT CHEST WITHOUT CONTRAST 04/30/2024 03:53:59 PM TECHNIQUE: CT of the chest was performed without the administration of intravenous contrast. Multiplanar reformatted images are provided for review. Automated exposure control, iterative reconstruction, and/or weight based adjustment of the mA/kV was utilized to reduce the radiation dose to as low as reasonably achievable. COMPARISON: CT angiogram chest 04/17/2024. CLINICAL HISTORY: Dyspnea, chronic, chest wall or pleura disease suspected. FINDINGS: MEDIASTINUM: The heart is mildly enlarged, unchanged. The main pulmonary artery remains enlarged compatible with pulmonary artery hypertension. There is a small hiatal hernia. LYMPH NODES: No mediastinal, hilar or axillary lymphadenopathy. LUNGS AND PLEURA: Multifocal patchy areas of ground glass and interstitial opacities in the bilateral lower lobes and bilateral upper lobes similar to the prior examination. Central peribronchial wall thickening persists. There is no new focal lung infiltrate. No pleural effusion or pneumothorax. SOFT TISSUES/BONES: No acute abnormality of the bones or soft tissues. UPPER ABDOMEN: Limited images of the upper abdomen demonstrates no acute  abnormality. IMPRESSION: 1. Unchanged multifocal patchy ground-glass and interstitial opacities bilaterally worrisome for atypical pneumonia. 2. Persistent central peribronchial wall thickening. 3. No new focal lung infiltrate, pleural effusion, or pneumothorax. 4. Mild cardiomegaly, unchanged. 5. Enlarged main pulmonary artery compatible with pulmonary hypertension. 6. Small hiatal hernia. Electronically signed by: Greig Pique MD 04/30/2024 04:04 PM EST RP Workstation: [id number]   DG Chest 2 View Result Date: 04/30/2024 EXAM: 2 VIEW(S) XRAY OF THE CHEST 04/30/2024 01:03:00 PM COMPARISON: 04/17/2024 CLINICAL HISTORY: SOB FINDINGS: LUNGS AND PLEURA: Pulmonary interstitial prominence with mild pulmonary edema. Low lung volumes. Retrocardiac airspace opacity. Small left pleural effusion. No pneumothorax. HEART AND MEDIASTINUM: Cardiomegaly. BONES AND SOFT TISSUES: No acute osseous abnormality. IMPRESSION: 1. Pulmonary interstitial prominence and airspace opacities, slightly improved from prior. Suggestive of pulmonary edema versus multifocal pneumonia. 2. Low lung volumes. 3. Cardiomegaly. Electronically signed by: Donnice Mania MD 04/30/2024 01:32 PM EST RP Workstation: [id number]   DG Chest 2 View Result Date: 04/17/2024 EXAM: 2 VIEW(S) XRAY OF THE CHEST 04/17/2024 04:58:00 PM COMPARISON: 01/02/2023 CLINICAL HISTORY: bibasilar infiltrates present and costophrencic angles are clear FINDINGS: LUNGS AND PLEURA: Low lung volumes. Diffuse interstitial prominence airspace opacities with a perihilar predominance. Trace left pleural effusion. Retrocardiac airspace opacity. No pneumothorax. HEART AND MEDIASTINUM: Cardiomegaly. BONES AND SOFT TISSUES: No acute osseous abnormality. IMPRESSION: 1. Diffuse interstitial prominence and airspace opacities with perihilar predominance, including retrocardiac opacity. Findings may be due to edema or pneumonia. Electronically signed by: Norman Gatlin MD 04/17/2024 08:46 PM EST  RP Workstation: [id number]   CT Angio Chest PE W and/or Wo Contrast Result Date: 04/17/2024 EXAM: CTA of the Chest with contrast for PE 04/17/2024 08:38:49 PM TECHNIQUE: CTA of the chest was performed without and with the administration of 100 mL of iohexol  (OMNIPAQUE ) 350 MG/ML injection. Multiplanar reformatted images are provided for review. MIP images are provided for review. Automated exposure control, iterative reconstruction, and/or weight based adjustment of the mA/kV was utilized to reduce the radiation dose to as low as reasonably achievable. COMPARISON: Same day x-ray and CT 11/27/2022. CLINICAL HISTORY: Pulmonary embolism (PE) suspected, high prob. FINDINGS: PULMONARY ARTERIES: Pulmonary arteries are adequately opacified for evaluation. Negative for pulmonary embolism noting limited evaluation of the peripheral pulmonary artery branches due to motion artifact . Dilated main pulmonary artery measuring 39 mm which can be seen with pulmonary hypertension. MEDIASTINUM: Cardiomegaly. The heart and pericardium demonstrate no acute abnormality. There is no acute abnormality of the thoracic aorta. LYMPH NODES: No mediastinal, hilar or axillary  lymphadenopathy. LUNGS AND PLEURA: Patchy bilateral ground glass opacities with a peribronchovascular distribution suggesting atypical pneumonia. No pleural effusion or pneumothorax. UPPER ABDOMEN: Cholelithiasis. Small hiatal hernia. Limited images of the upper abdomen are otherwise unremarkable. SOFT TISSUES AND BONES: No acute bone or soft tissue abnormality. IMPRESSION: 1. No pulmonary embolism. 2. Patchy bilateral ground glass opacities with a peribronchovascular distribution, suggesting atypical pneumonia. 3. Dilated main pulmonary artery measuring 39 mm, which can be seen with pulmonary hypertension. 4. Cardiomegaly. Electronically signed by: Norman Gatlin MD 04/17/2024 08:45 PM EST RP Workstation: [id number]    Alm Schneider, DO  [facility]  If  7PM-7AM, please contact night-coverage [email or web address] Password [email or web address] 05/01/2024, 7:06 AM   LOS: 1 day

## 2024-05-01 NOTE — Plan of Care (Signed)

## 2024-05-02 ENCOUNTER — Inpatient Hospital Stay (HOSPITAL_COMMUNITY)

## 2024-05-02 ENCOUNTER — Other Ambulatory Visit (HOSPITAL_COMMUNITY): Payer: Self-pay | Admitting: *Deleted

## 2024-05-02 LAB — BASIC METABOLIC PANEL WITH GFR
Anion gap: 4 — ABNORMAL LOW (ref 5–15)
BUN: 13 mg/dL (ref 8–23)
CO2: 32 mmol/L (ref 22–32)
Calcium: 8.7 mg/dL — ABNORMAL LOW (ref 8.9–10.3)
Chloride: 100 mmol/L (ref 98–111)
Creatinine, Ser: 0.95 mg/dL (ref 0.44–1.00)
GFR, Estimated: >60 mL/min (ref >60)
Glucose, Bld: 73 mg/dL (ref 70–99)
Potassium: 3.9 mmol/L (ref 3.5–5.1)
Sodium: 137 mmol/L (ref 135–145)

## 2024-05-02 LAB — MYCOPLASMA PNEUMONIAE ANTIBODY, IGM: Mycoplasma pneumo IgM: <770 U/mL (ref 0–769)

## 2024-05-02 LAB — MAGNESIUM: Magnesium: 2.5 mg/dL — ABNORMAL HIGH (ref 1.7–2.4)

## 2024-05-02 MED ORDER — FUROSEMIDE 20 MG PO TABS: 20.0000 mg | ORAL_TABLET | Freq: Every day | ORAL | 3 refills | Status: AC

## 2024-05-02 MED ORDER — AZITHROMYCIN 250 MG PO TABS
500.0000 mg | ORAL_TABLET | Freq: Every day | ORAL | Status: DC
  Administered 2024-05-02: 14:00:00 500 mg via ORAL
  Filled 2024-05-02: qty 2

## 2024-05-02 NOTE — Discharge Summary (Signed)
 Physician Discharge Summary   Patient: Emma Morris MRN: [id number] DOB: 08-09-1960  Admit date:     04/30/2024  Discharge date: 05/02/2024  Discharge Physician: Eric Nunnery   PCP: Katrinka Aquas, MD   Recommendations at discharge:  Repeat chest x-ray in 6-8 weeks to assure resolution of infiltrate Continue assisting patient with weight loss management Reassess blood pressure and adjust antihypertensive regimen/diuretic therapy as required Repeat basic metabolic panel to follow electrolytes and renal function. Please make sure patient continue to follow-up with pulmonary medicine for further management of OHS/OSA.   Discharge Diagnoses: Principal Problem:   Community acquired pneumonia Active Problems:   Obesity hypoventilation syndrome (HCC)   Morbid obesity with BMI of 50.0-59.9, adult (HCC)   Obesity, Class III, BMI 40-49.9 (morbid obesity) (HCC)   Acute on chronic heart failure with preserved ejection fraction (HFpEF) (HCC)   Lobar pneumonia  Brief Hospital admission narrative: 63 year old female with a history of hypertension, pulmonary embolus on apixaban , OHS, paroxysmal atrial fibrillation, HFpEF and morbid obesity presenting with subjective fevers, chest congestion and shortness of breath.  The patient was recently mated to the hospital from 04/17/2024 to 04/19/2024 when she was treated for pneumonia.  She was discharged home with cefdinir  and azithromycin  which she finished.  She states that she felt relatively better but in the past 2 to 3 days she has had worsening chest congestion and some shortness of breath.  She has noted some slight worsening of her lower extremity edema.  She states that she has had to sleep in a recliner for the past 2 to 3 days because of worsening cough when she lays flat. She denies any nausea, vomiting or diarrhea, abdominal pain.  There is no hematochezia or melena.  In the ED, the patient was afebrile and hemodynamically stable with oxygen  saturation 86% room air.  WBC 5.0, hemoglobin 12.7, platelets 254.  Sodium 138, potassium 4.1, bicarbonate 26, serum creatinine 0.95.  LFTs were unremarkable.  Lactic acid 1.5>> 1.0.  Troponin 26>> 22.  CT chest showed multifocal patchy GGO and interstitial opacities bilateral lower lobe and bilateral upper lobes.  There was central peribronchial wall thickening.  There is no large pulmonary artery.  The patient was started on ceftriaxone  and azithromycin .   Assessment and Plan: Acute on chronic respiratory failure with hypoxia and hypercarbia - Oxygen saturation 86% on room air with dyspnea - Stable on 2 L with saturation up to 95% at time of admission - After antibiotics, nebulizer management and the use of diuretics patient condition is stabilized and was no longer requiring oxygen at rest - Would recommend outpatient desaturation screening and further compliance with most likely the use of oxygen for exertion along with the use of CPAP or BiPAP nightly. -Continue outpatient follow-up with pulmonary service - Complete antibiotic therapy as instructed for underlying pneumonia - Repeat chest x-ray in 6-8 weeks.   Acute on chronic HFpEF - Patient with most likely component of diastolic heart failure - Most recent echo EF 65 to 70%, no WMA, RV overload - Continue Jardiance  -Continue the use of spironolactone  and the use of Lasix  on daily basis - Heart healthy/low-sodium diet discussed with patient - Continue to follow volume status and clinical response - MIBI complaining of pulmonary hypertension in the setting of OSA; patient will benefit of compliant with CPAP along with weight loss management.   Pulmonary infiltrates/pneumonia - COVID-19/RSV/flu--negative - Complete oral antibiotics as prescribed - Continue the use of as needed mucolytic's along with bronchodilator  management - Repeat x-rays in 6-8 weeks - No fever and no requiring oxygen supplementation at rest.   Paroxysmal atrial  fibrillation - Currently in sinus rhythm - Continue apixaban  - Continue Cardizem  CD   History of pulmonary embolus - Continue apixaban  as mentioned above.   OHS - She is currently following pulmonary medicine - Recently had sleep study--waiting for set up for NIV at home   Morbid obesity - Body mass index is 56.9 kg/m.  - Low-calorie diet, portion control and increasing activity discussed with patient.   Elevated troponin - Secondary to demand ischemia - Patient denies any chest pain. - Troponin 26>>> 22; demonstrating flat elevation - Telemetry and EKG without acute ischemic changes.   Consultants: None Procedures performed: See below for x-ray reports. Disposition: Home. Diet recommendation: Heart healthy/low calorie diet.  DISCHARGE MEDICATION: Allergies as of 05/02/2024   No Known Allergies      Medication List     STOP taking these medications    chlorpheniramine-HYDROcodone  10-8 MG/5ML Commonly known as: TUSSIONEX       TAKE these medications    acetaminophen  500 MG tablet Commonly known as: TYLENOL  Take 1,000 mg by mouth every 6 (six) hours as needed for mild pain (pain score 1-3).   albuterol  108 (90 Base) MCG/ACT inhaler Commonly known as: VENTOLIN  HFA Inhale 2 puffs into the lungs every 6 (six) hours as needed for wheezing or shortness of breath.   amoxicillin-clavulanate 875-125 MG tablet Commonly known as: AUGMENTIN Take 1 tablet by mouth 2 (two) times daily for 5 days.   apixaban  5 MG Tabs tablet Commonly known as: Eliquis  Take 1 tablet (5 mg total) by mouth 2 (two) times daily.   diltiazem  180 MG 24 hr capsule Commonly known as: TIAZAC  Take 180 mg by mouth daily.   empagliflozin  10 MG Tabs tablet Commonly known as: Jardiance  Take 1 tablet (10 mg total) by mouth daily before breakfast.   fluticasone 50 MCG/ACT nasal spray Commonly known as: FLONASE Place 1 spray into both nostrils daily as needed for allergies.   furosemide  20 MG  tablet Commonly known as: Lasix  Take 1 tablet (20 mg total) by mouth daily.   loratadine  10 MG tablet Commonly known as: CLARITIN  Take 10 mg by mouth daily.   potassium chloride  10 MEQ tablet Commonly known as: KLOR-CON  Take 1 tablet (10 mEq total) by mouth daily. Take While taking Lasix /furosemide    senna-docusate 8.6-50 MG tablet Commonly known as: Senokot-S Take 1 tablet by mouth daily.   spironolactone  25 MG tablet Commonly known as: ALDACTONE  Take 1 tablet (25 mg total) by mouth 2 (two) times daily.   traMADol  50 MG tablet Commonly known as: ULTRAM  Take 1 tablet (50 mg total) by mouth every 6 (six) hours as needed. What changed: reasons to take this        Follow-up Information     Katrinka Aquas, MD. Schedule an appointment as soon as possible for a visit in 10 day(s).   Specialty: Internal Medicine Contact information: [address] [place] [address] [phone]                Discharge Exam: Fredricka Weights   04/30/24 1930 05/01/24 0550 05/02/24 0500  Weight: (!) 136.6 kg (!) 136.9 kg (!) 136.6 kg   General exam: Alert, awake, oriented x 3; in no acute distress and able to speak in full sentences. Respiratory system: Improved air movement bilaterally; no wheezing, no frank crackles.  Good saturation on room  air. Cardiovascular system: Rate controlled, no rubs, no gallops, unable to assess JVD with body habitus. Gastrointestinal system: Abdomen is obese, nondistended, soft and nontender. No organomegaly or masses felt. Normal bowel sounds heard. Central nervous system: Alert and oriented. No focal neurological deficits. Extremities: No cyanosis or clubbing. Skin: No petechiae. Psychiatry: Judgement and insight appear normal. Mood & affect appropriate.    Condition at discharge: Stable and improved.  The results of significant diagnostics from this hospitalization (including imaging, microbiology, ancillary and laboratory) are listed below  for reference.   Imaging Studies: CT Chest Wo Contrast Result Date: 04/30/2024 EXAM: CT CHEST WITHOUT CONTRAST 04/30/2024 03:53:59 PM TECHNIQUE: CT of the chest was performed without the administration of intravenous contrast. Multiplanar reformatted images are provided for review. Automated exposure control, iterative reconstruction, and/or weight based adjustment of the mA/kV was utilized to reduce the radiation dose to as low as reasonably achievable. COMPARISON: CT angiogram chest 04/17/2024. CLINICAL HISTORY: Dyspnea, chronic, chest wall or pleura disease suspected. FINDINGS: MEDIASTINUM: The heart is mildly enlarged, unchanged. The main pulmonary artery remains enlarged compatible with pulmonary artery hypertension. There is a small hiatal hernia. LYMPH NODES: No mediastinal, hilar or axillary lymphadenopathy. LUNGS AND PLEURA: Multifocal patchy areas of ground glass and interstitial opacities in the bilateral lower lobes and bilateral upper lobes similar to the prior examination. Central peribronchial wall thickening persists. There is no new focal lung infiltrate. No pleural effusion or pneumothorax. SOFT TISSUES/BONES: No acute abnormality of the bones or soft tissues. UPPER ABDOMEN: Limited images of the upper abdomen demonstrates no acute abnormality. IMPRESSION: 1. Unchanged multifocal patchy ground-glass and interstitial opacities bilaterally worrisome for atypical pneumonia. 2. Persistent central peribronchial wall thickening. 3. No new focal lung infiltrate, pleural effusion, or pneumothorax. 4. Mild cardiomegaly, unchanged. 5. Enlarged main pulmonary artery compatible with pulmonary hypertension. 6. Small hiatal hernia. Electronically signed by: Greig Pique MD 04/30/2024 04:04 PM EST RP Workstation: [id number]   DG Chest 2 View Result Date: 04/30/2024 EXAM: 2 VIEW(S) XRAY OF THE CHEST 04/30/2024 01:03:00 PM COMPARISON: 04/17/2024 CLINICAL HISTORY: SOB FINDINGS: LUNGS AND PLEURA: Pulmonary  interstitial prominence with mild pulmonary edema. Low lung volumes. Retrocardiac airspace opacity. Small left pleural effusion. No pneumothorax. HEART AND MEDIASTINUM: Cardiomegaly. BONES AND SOFT TISSUES: No acute osseous abnormality. IMPRESSION: 1. Pulmonary interstitial prominence and airspace opacities, slightly improved from prior. Suggestive of pulmonary edema versus multifocal pneumonia. 2. Low lung volumes. 3. Cardiomegaly. Electronically signed by: Donnice Mania MD 04/30/2024 01:32 PM EST RP Workstation: [id number]   DG Chest 2 View Result Date: 04/17/2024 EXAM: 2 VIEW(S) XRAY OF THE CHEST 04/17/2024 04:58:00 PM COMPARISON: 01/02/2023 CLINICAL HISTORY: bibasilar infiltrates present and costophrencic angles are clear FINDINGS: LUNGS AND PLEURA: Low lung volumes. Diffuse interstitial prominence airspace opacities with a perihilar predominance. Trace left pleural effusion. Retrocardiac airspace opacity. No pneumothorax. HEART AND MEDIASTINUM: Cardiomegaly. BONES AND SOFT TISSUES: No acute osseous abnormality. IMPRESSION: 1. Diffuse interstitial prominence and airspace opacities with perihilar predominance, including retrocardiac opacity. Findings may be due to edema or pneumonia. Electronically signed by: Norman Gatlin MD 04/17/2024 08:46 PM EST RP Workstation: [id number]   CT Angio Chest PE W and/or Wo Contrast Result Date: 04/17/2024 EXAM: CTA of the Chest with contrast for PE 04/17/2024 08:38:49 PM TECHNIQUE: CTA of the chest was performed without and with the administration of 100 mL of iohexol  (OMNIPAQUE ) 350 MG/ML injection. Multiplanar reformatted images are provided for review. MIP images are provided for review. Automated exposure control, iterative reconstruction, and/or weight based adjustment of  the mA/kV was utilized to reduce the radiation dose to as low as reasonably achievable. COMPARISON: Same day x-ray and CT 11/27/2022. CLINICAL HISTORY: Pulmonary embolism (PE) suspected, high prob.  FINDINGS: PULMONARY ARTERIES: Pulmonary arteries are adequately opacified for evaluation. Negative for pulmonary embolism noting limited evaluation of the peripheral pulmonary artery branches due to motion artifact . Dilated main pulmonary artery measuring 39 mm which can be seen with pulmonary hypertension. MEDIASTINUM: Cardiomegaly. The heart and pericardium demonstrate no acute abnormality. There is no acute abnormality of the thoracic aorta. LYMPH NODES: No mediastinal, hilar or axillary lymphadenopathy. LUNGS AND PLEURA: Patchy bilateral ground glass opacities with a peribronchovascular distribution suggesting atypical pneumonia. No pleural effusion or pneumothorax. UPPER ABDOMEN: Cholelithiasis. Small hiatal hernia. Limited images of the upper abdomen are otherwise unremarkable. SOFT TISSUES AND BONES: No acute bone or soft tissue abnormality. IMPRESSION: 1. No pulmonary embolism. 2. Patchy bilateral ground glass opacities with a peribronchovascular distribution, suggesting atypical pneumonia. 3. Dilated main pulmonary artery measuring 39 mm, which can be seen with pulmonary hypertension. 4. Cardiomegaly. Electronically signed by: Norman Gatlin MD 04/17/2024 08:45 PM EST RP Workstation: [id number]    Microbiology: Results for orders placed or performed during the hospital encounter of 04/30/24  Resp panel by RT-PCR (RSV, Flu A&B, Covid) Anterior Nasal Swab     Status: None   Collection Time: 04/30/24  1:24 PM   Specimen: Anterior Nasal Swab  Result Value Ref Range Status   SARS Coronavirus 2 by RT PCR NEGATIVE NEGATIVE Final    Comment: (NOTE) SARS-CoV-2 target nucleic acids are NOT DETECTED.  The SARS-CoV-2 RNA is generally detectable in upper respiratory specimens during the acute phase of infection. The lowest concentration of SARS-CoV-2 viral copies this assay can detect is 138 copies/mL. A negative result does not preclude SARS-Cov-2 infection and should not be used as the sole basis for  treatment or other patient management decisions. A negative result may occur with  improper specimen collection/handling, submission of specimen other than nasopharyngeal swab, presence of viral mutation(s) within the areas targeted by this assay, and inadequate number of viral copies(<138 copies/mL). A negative result must be combined with clinical observations, patient history, and epidemiological information. The expected result is Negative.  Fact Sheet for Patients:  bloggercourse.com  Fact Sheet for Healthcare Providers:  seriousbroker.it  This test is no t yet approved or cleared by the [place]  FDA and  has been authorized for detection and/or diagnosis of SARS-CoV-2 by FDA under an Emergency Use Authorization (EUA). This EUA will remain  in effect (meaning this test can be used) for the duration of the COVID-19 declaration under Section 564(b)(1) of the Act, 21 U.S.C.section 360bbb-3(b)(1), unless the authorization is terminated  or revoked sooner.       Influenza A by PCR NEGATIVE NEGATIVE Final   Influenza B by PCR NEGATIVE NEGATIVE Final    Comment: (NOTE) The Xpert Xpress SARS-CoV-2/FLU/RSV plus assay is intended as an aid in the diagnosis of influenza from Nasopharyngeal swab specimens and should not be used as a sole basis for treatment. Nasal washings and aspirates are unacceptable for Xpert Xpress SARS-CoV-2/FLU/RSV testing.  Fact Sheet for Patients: bloggercourse.com  Fact Sheet for Healthcare Providers: seriousbroker.it  This test is not yet approved or cleared by the [place]  FDA and has been authorized for detection and/or diagnosis of SARS-CoV-2 by FDA under an Emergency Use Authorization (EUA). This EUA will remain in effect (meaning this test can be used) for the duration of the COVID-19  declaration under Section 564(b)(1) of the Act, 21  U.S.C. section 360bbb-3(b)(1), unless the authorization is terminated or revoked.     Resp Syncytial Virus by PCR NEGATIVE NEGATIVE Final    Comment: (NOTE) Fact Sheet for Patients: bloggercourse.com  Fact Sheet for Healthcare Providers: seriousbroker.it  This test is not yet approved or cleared by the [place]  FDA and has been authorized for detection and/or diagnosis of SARS-CoV-2 by FDA under an Emergency Use Authorization (EUA). This EUA will remain in effect (meaning this test can be used) for the duration of the COVID-19 declaration under Section 564(b)(1) of the Act, 21 U.S.C. section 360bbb-3(b)(1), unless the authorization is terminated or revoked.  Performed at [facility], [address] [place], [place] [address]   Blood culture (routine x 2)     Status: None (Preliminary result)   Collection Time: 04/30/24  1:49 PM   Specimen: BLOOD  Result Value Ref Range Status   Specimen Description BLOOD LEFT HAND  Final   Special Requests   Final    BOTTLES DRAWN AEROBIC ONLY Blood Culture results may not be optimal due to an inadequate volume of blood received in culture bottles   Culture   Final    NO GROWTH 2 DAYS Performed at [facility], [address] [place], [place] [address]    Report Status PENDING  Incomplete  Blood culture (routine x 2)     Status: None (Preliminary result)   Collection Time: 04/30/24  2:01 PM   Specimen: BLOOD  Result Value Ref Range Status   Specimen Description BLOOD BLOOD RIGHT ARM  Final   Special Requests   Final    Blood Culture adequate volume BOTTLES DRAWN AEROBIC AND ANAEROBIC   Culture   Final    NO GROWTH 2 DAYS Performed at [facility], [address] [place], [place] [address]    Report Status PENDING  Incomplete  Respiratory (~20 pathogens) panel by PCR     Status: None   Collection Time: 04/30/24  7:30 PM   Specimen: Nasopharyngeal Swab; Respiratory  Result Value  Ref Range Status   Adenovirus NOT DETECTED NOT DETECTED Final   Coronavirus 229E NOT DETECTED NOT DETECTED Final    Comment: (NOTE) The Coronavirus on the Respiratory Panel, DOES NOT test for the novel  Coronavirus (2019 nCoV)    Coronavirus HKU1 NOT DETECTED NOT DETECTED Final   Coronavirus NL63 NOT DETECTED NOT DETECTED Final   Coronavirus OC43 NOT DETECTED NOT DETECTED Final   Metapneumovirus NOT DETECTED NOT DETECTED Final   Rhinovirus / Enterovirus NOT DETECTED NOT DETECTED Final   Influenza A NOT DETECTED NOT DETECTED Final   Influenza B NOT DETECTED NOT DETECTED Final   Parainfluenza Virus 1 NOT DETECTED NOT DETECTED Final   Parainfluenza Virus 2 NOT DETECTED NOT DETECTED Final   Parainfluenza Virus 3 NOT DETECTED NOT DETECTED Final   Parainfluenza Virus 4 NOT DETECTED NOT DETECTED Final   Respiratory Syncytial Virus NOT DETECTED NOT DETECTED Final   Bordetella pertussis NOT DETECTED NOT DETECTED Final   Bordetella Parapertussis NOT DETECTED NOT DETECTED Final   Chlamydophila pneumoniae NOT DETECTED NOT DETECTED Final   Mycoplasma pneumoniae NOT DETECTED NOT DETECTED Final    Comment: Performed at [facility], [address], [place], [place] [address]    Labs: CBC: Recent Labs  Lab 04/30/24 1313 05/01/24 0526  WBC 5.0 4.7  NEUTROABS 3.2  --   HGB 12.7 11.7*  HCT 42.9 39.1  MCV 87.4  86.7  PLT 254 234   Basic Metabolic Panel: Recent Labs  Lab 04/30/24 1313 05/01/24 0526 05/02/24 0421  NA 138 139 137  K 4.1 4.3 3.9  CL 100 102 100  CO2 26 34* 32  GLUCOSE 95 88 73  BUN 11 9 13   CREATININE 0.95 0.88 0.95  CALCIUM 9.1 9.0 8.7*  MG  --   --  2.5*   Liver Function Tests: Recent Labs  Lab 04/30/24 1320  AST 14*  ALT 7  ALKPHOS 64  BILITOT 0.3  PROT 8.6*  ALBUMIN 4.0   CBG: No results for input(s): GLUCAP in the last 168 hours.  Discharge time spent:  35 minutes.  Signed: Eric Nunnery, MD [facility] 05/02/2024

## 2024-05-02 NOTE — Progress Notes (Signed)
 Patient discharged home prior to echo completion.                                                                                Aida Pizza, RCS

## 2024-05-02 NOTE — Progress Notes (Signed)
 Patient eating breakfast.                                  Aida Pizza, RCS

## 2024-05-02 NOTE — Plan of Care (Signed)
°  Problem: Education: Goal: Knowledge of General Education information will improve Description: Including pain rating scale, medication(s)/side effects and non-pharmacologic comfort measures Outcome: Progressing   Problem: Clinical Measurements: Goal: Ability to maintain clinical measurements within normal limits will improve Outcome: Progressing   Problem: Clinical Measurements: Goal: Respiratory complications will improve Outcome: Progressing   Problem: Activity: Goal: Risk for activity intolerance will decrease Outcome: Progressing   Problem: Pain Managment: Goal: General experience of comfort will improve and/or be controlled Outcome: Progressing   Problem: Safety: Goal: Ability to remain free from injury will improve Outcome: Progressing   Problem: Skin Integrity: Goal: Risk for impaired skin integrity will decrease Outcome: Progressing

## 2024-05-02 NOTE — Progress Notes (Signed)
 PHARMACIST - PHYSICIAN COMMUNICATION DR:   TRH CONCERNING: Antibiotic IV to Oral Route Change Policy  RECOMMENDATION: This patient is receiving Azithromycin  by the intravenous route.  Based on criteria approved by the Pharmacy and Therapeutics Committee, the antibiotic(s) is/are being converted to the equivalent oral dose form(s).   DESCRIPTION: These criteria include: Patient being treated for a respiratory tract infection, urinary tract infection, cellulitis or clostridium difficile associated diarrhea if on metronidazole The patient is not neutropenic and does not exhibit a GI malabsorption state The patient is eating (either orally or via tube) and/or has been taking other orally administered medications for a least 24 hours The patient is improving clinically and has a Tmax < 100.5  If you have questions about this conversion, please contact the Pharmacy Department  []   [phone] )  [facility] [x]   [phone] )  [facility] []   [phone] )  [facility] []   [phone] )  [facility] []   [phone] )  [facility]    Celestine Slovak, PharmD, [place], [place] Work Cell: [phone] 05/02/2024 9:21 AM

## 2024-05-02 NOTE — Progress Notes (Signed)
 Patient in bathroom at 3:15 p.m.   Will check back.                                                         Aida Pizza, RCS

## 2024-05-03 LAB — LEGIONELLA PNEUMOPHILA SEROGP 1 UR AG
L. pneumophila Serogp 1 Ur Ag: NEGATIVE
Source of Sample: 0

## 2024-05-05 LAB — CULTURE, BLOOD (ROUTINE X 2)
Culture: NO GROWTH
Culture: NO GROWTH
Special Requests: ADEQUATE

## 2024-05-14 ENCOUNTER — Ambulatory Visit (HOSPITAL_BASED_OUTPATIENT_CLINIC_OR_DEPARTMENT_OTHER): Admitting: Internal Medicine

## 2024-05-22 ENCOUNTER — Ambulatory Visit (HOSPITAL_BASED_OUTPATIENT_CLINIC_OR_DEPARTMENT_OTHER): Attending: Pulmonary Disease | Admitting: Pulmonary Disease

## 2024-05-22 DIAGNOSIS — G4733 Obstructive sleep apnea (adult) (pediatric): Secondary | ICD-10-CM | POA: Insufficient documentation

## 2024-05-22 DIAGNOSIS — G4730 Sleep apnea, unspecified: Secondary | ICD-10-CM | POA: Insufficient documentation

## 2024-05-23 ENCOUNTER — Telehealth: Payer: Self-pay | Admitting: Orthopedic Surgery

## 2024-05-23 MED ORDER — TRAMADOL HCL 50 MG PO TABS: 50.0000 mg | ORAL_TABLET | Freq: Four times a day (QID) | ORAL | 0 refills | Status: AC | PRN

## 2024-05-23 NOTE — Addendum Note (Signed)
 Addended by: ONESIMO ANES A on: 05/23/2024 04:42 PM   Modules accepted: Orders

## 2024-05-23 NOTE — Telephone Encounter (Signed)
 Dr. Onesimo pt - spoke w/the pt, she is requesting a refill for Tramadol  50mg  to be sent to [facility]

## 2024-05-27 ENCOUNTER — Telehealth: Payer: Self-pay | Admitting: Pulmonary Disease

## 2024-05-27 DIAGNOSIS — G4733 Obstructive sleep apnea (adult) (pediatric): Secondary | ICD-10-CM

## 2024-05-27 NOTE — Telephone Encounter (Signed)
 Call patient  Sleep study result  Date of study: 05/22/2024  Impression: Severe obstructive sleep apnea with severe oxygen desaturations AHI of 64.7 with O2 nadir of 40%.  Saturations below 88% for 213 minutes about half of the study Sleep was very fragmented  Recommendation: Schedule for titration study May need oxygen supplementation, may need titration to BiPAP with ongoing concern for hypoventilation syndrome Avoid alcohol, sedatives and other CNS depressants that may worsen sleep apnea and disrupt normal sleep architecture. Sleep hygiene should be reviewed to assess factors that may improve sleep quality. Weight management and regular exercise should be initiated or continued  Close clinical follow-up for optimization of treatment

## 2024-05-27 NOTE — Procedures (Signed)
" °  Indications for Polysomnography The patient is a 64 year old Female who is 5' 2 and weighs 300.0 lbs. Her BMI equals 55.2.  A full night polysomnogram was performed to evaluate for -.  Medications taken at 0020.TYLENOL  Polysomnogram Data A full night polysomnogram recorded the standard physiologic parameters including EEG, EOG, EMG, EKG, nasal and oral airflow.  Respiratory parameters of chest and abdominal movements were recorded with Respiratory Inductance Plethysmography belts.   Oxygen saturation was recorded by pulse oximetry.  Sleep Architecture The total recording time of the polysomnogram was 440.0 minutes.  The total sleep time was 212.5 minutes.  The patient spent 23.1% of total sleep time in Stage N1, 53.9% in Stage N2, 4.9% in Stages N3, and 18.1% in REM.  Sleep latency was 45.7 minutes.   REM latency was 63.0 minutes.  Sleep Efficiency was 48.3%.  Wake after Sleep Onset time was 181.5 minutes.  Respiratory Events The polysomnogram revealed a presence of - obstructive, - central, and - mixed apneas resulting in an Apnea index of - events per hour.  There were 229 hypopneas (GreaterEqual to3% desaturation and/or arousal) resulting in an Apnea\Hypopnea Index (AHI  GreaterEqual to3% desaturation and/or arousal) of 64.7 events per hour.  There were 197 hypopneas (GreaterEqual to4% desaturation) resulting in an Apnea\Hypopnea Index (AHI GreaterEqual to4% desaturation) of 55.6 events per hour.  There were 39  Respiratory Effort Related Arousals resulting in a RERA index of 11.0 events per hour. The Respiratory Disturbance Index is 75.7 events per hour.  The snore index was - events per hour.  Mean oxygen saturation was 85.7%.  The lowest oxygen saturation during sleep was 40.0%.  Time spent LessEqual to88% oxygen saturation was  minutes ().  Limb Activity There were - total limb movements recorded, of this total, - were classified as PLMs.  PLM index was - per hour and PLM associated  with Arousals index was - per hour.  Cardiac Summary The average pulse rate was 84.2 bpm.  The minimum pulse rate was 65.0 bpm while the maximum pulse rate was 106.0 bpm.  Cardiac rhythm was normal/abnormal.  Comments: Patient was scheduled for split-night study, diagnostic study performed as she did not meet split threshold. Very poor sleep efficiency Very fragmented sleep  Diagnosis: Severe obstructive sleep apnea with an AHI of 64.7 Oxygen nadir of 40% Saturations below 88% for 213 minutes, about half the study. Very poor sleep efficiency at 48%, difficulty initiating sleep and difficulty maintaining sleep, a lot of arousals following respiratory events No significant periodic limb movement Cardiac rhythm was sinus, sinus pauses noted  Recommendations: Schedule for titration study. May need oxygen supplementation. With patient's underlying chronic respiratory failure, may need titration to a BiPAP with ongoing concern for hypoventilation syndrome. Avoid alcohol, sedatives and other CNS depressants that may worsen sleep apnea and disrupt normal sleep architecture. Sleep hygiene should be reviewed to assess factors that may improve sleep quality. Weight management and regular exercise should be initiated or continued  This study was personally reviewed and electronically signed by: Dr. Jennet Epley Accredited Board Certified in Sleep Medicine Date/Time:  05/27/24 "

## 2024-05-27 NOTE — Procedures (Signed)
 [facility] [address] [place], [place] [address] Tel: [phone]   Fax: [phone]  Polysomnography Interpretation  Patient Name:  Emma Morris, Emma Morris Date:  05/22/2024   Indications for Polysomnography The patient is a 64 year old Female who is 5' 2 and weighs 300.0 lbs. Her BMI equals 55.2.  A full night polysomnogram was performed to evaluate for -.  Medications taken at 0020.  TYLENOL    Polysomnogram Data A full night polysomnogram recorded the standard physiologic parameters including EEG, EOG, EMG, EKG, nasal and oral airflow.  Respiratory parameters of chest and abdominal movements were recorded with Respiratory Inductance Plethysmography belts.  Oxygen saturation was recorded by pulse oximetry.   Sleep Architecture The total recording time of the polysomnogram was 440.0 minutes.  The total sleep time was 212.5 minutes.  The patient spent 23.1% of total sleep time in Stage N1, 53.9% in Stage N2, 4.9% in Stages N3, and 18.1% in REM.  Sleep latency was 45.7 minutes.  REM latency was 63.0 minutes.  Sleep Efficiency was 48.3%.  Wake after Sleep Onset time was 181.5 minutes.  Respiratory Events The polysomnogram revealed a presence of - obstructive, - central, and - mixed apneas resulting in an Apnea index of - events per hour.  There were 229 hypopneas (>=3% desaturation and/or arousal) resulting in an Apnea\Hypopnea Index (AHI >=3% desaturation and/or arousal) of 64.7 events per hour.  There were 197 hypopneas (>=4% desaturation) resulting in an Apnea\Hypopnea Index (AHI >=4% desaturation) of 55.6 events per hour.  There were 39 Respiratory Effort Related Arousals resulting in a RERA index of 11.0 events per hour. The Respiratory Disturbance Index is 75.7 events per hour.  The snore index was - events per hour.  Mean oxygen saturation was 85.7%.  The lowest oxygen saturation during sleep was 40.0%.  Time spent <=88% oxygen saturation was  213.3 minutes (49.7%).  Limb Activity There were - total limb movements recorded, of this total, - were classified as PLMs.  PLM index was - per hour and PLM associated with Arousals index was - per hour.  Cardiac Summary The average pulse rate was 84.2 bpm.  The minimum pulse rate was 65.0 bpm while the maximum pulse rate was 106.0 bpm.  Cardiac rhythm was normal/abnormal.  Comments:  Patient was scheduled for split-night study, diagnostic study performed as she did not meet split threshold. Very poor sleep efficiency Very fragmented sleep  Diagnosis:  Severe obstructive sleep apnea with an AHI of 64.7 Oxygen nadir of 40% Saturations below 88% for 213 minutes, about half the study. Very poor sleep efficiency at 48%, difficulty initiating sleep and difficulty maintaining sleep, a lot of arousals following respiratory events No significant periodic limb movement Cardiac rhythm was sinus, sinus pauses noted  Recommendations: Schedule for titration study.   May need oxygen supplementation. With patient's underlying chronic respiratory failure, may need titration to a BiPAP with ongoing concern for hypoventilation syndrome. Avoid alcohol, sedatives and other CNS depressants that may worsen sleep apnea and disrupt normal sleep architecture. Sleep hygiene should be reviewed to assess factors that may improve sleep quality. Weight management and regular exercise should be initiated or continued  This study was personally reviewed and electronically signed by: Dr. Jennet Epley Accredited Board Certified in Sleep Medicine Date/Time:   05/27/24     Diagnostic PSG Report  Patient Name: Emma Morris, Emma Morris Date: 05/22/2024  Date of Birth: 09/29/1960 Study Type: Split Night  Age: 39 year MRN #: [id number]  Sex:  Female Interpreting Physician: NEDA HAMMOND, [id number]  Height: 5' 2 Referring Physician: PAULA SOUTHERLY [id number])  Weight: 300.0 lbs Recording Tech: Dewane Hacker CRT  RPSGT RST  BMI: 55.2 Scoring Tech: Dewane Hacker CRT RPSGT RST  ESS: 9 Neck Size: 15.5   Study Overview  Lights Off: 09:38:44 PM  Count Index  Lights On: 04:58:42 AM Awakenings: 75 21.2  Time in Bed: 440.0 min. Arousals: 160 45.2  Total Sleep Time: 212.5 min. AHI (>=3% Desat and/or Ar.): 229 64.7   Sleep Efficiency: 48.3% AHI (>=4% Desat): 197 55.6   Sleep Latency: 45.7 min. Limb Movements: - -  Wake After Sleep Onset: 181.5 min. Snore: - -  REM Latency from Sleep Onset: 63.0 min. Desaturations: 283 79.9     Minimum SpO2 TST: 40.0%    Sleep Architecture  % of Time in Bed Stages Time (mins) % Sleep Time  Wake 228.0   Stage N1 49.0 23.1%  Stage N2 114.5 53.9%  Stage N3 10.5 4.9%  REM 38.5 18.1%   Arousal Summary   NREM REM Sleep Index  Respiratory Arousals 86 20 106 29.9  PLM Arousals - - - -  Isolated Limb Movement Arousals - - - -  Snore Arousals - - - -  Spontaneous Arousals 51 3 54 15.2  Total 137 23 160 45.2   Limb Movement Summary   Count Index  Isolated Limb Movements - -  Periodic Limb Movements (PLMs) - -  Total Limb Movements - -    Respiratory Summary   By Sleep Stage By Body Position Total   NREM REM Supine Non-Supine   Time (min) 174.0 38.5 199.0 13.5 212.5         Obstructive Apnea - - - - -  Mixed Apnea - - - - -  Central Apnea - - - - -  Total Apneas - - - - -  Total Apnea Index - - - - -         Hypopneas (>=3% Desat and/or Ar.) 191 38 218 11 229  AHI (>=3% Desat and/or Ar.) 65.9 59.2 65.7 48.9 64.7         Hypopneas (>=4% Desat) 165 32 188 9 197  AHI (>=4% Desat) 56.9 49.9 56.7 40.0 55.6          RERAs 35 4 28 11  39  RERA Index 12.1 6.2 8.4 48.9 11.0         RDI 77.9 65.5 74.2 97.8 75.7    Respiratory Event Type Index  Central Apneas -  Obstructive Apneas -  Mixed Apneas -  Central Hypopneas -  Obstructive Hypopneas 64.7  Central Apnea + Hypopnea (CAHI) -  Obstructive Apnea + Hypopnea (OAHI) 64.7   Respiratory Event  Durations   Apnea Hypopnea   NREM REM NREM REM  Average (seconds) - - 19.3 32.8  Maximum (seconds) - - 52.5 73.8    Oxygen Saturation Summary   Wake NREM REM TST TIB  Average SpO2 (%) 89.0% 86.5% 64.2% 82.4% 85.7%  Minimum SpO2 (%) 39.0% 56.0% 40.0% 40.0% 39.0%  Maximum SpO2 (%) 98.0% 97.0% 88.0% 97.0% 98.0%   Oxygen Saturation Distribution  Range (%) Time in range (min) Time in range (%)  90.0 - 100.0 102.9 24.0%  80.0 - 90.0 268.1 62.5%  70.0 - 80.0 25.3 5.9%  60.0 - 70.0 16.9 3.9%  50.0 - 60.0 10.3 2.4%  0.0 - 50.0 5.6 1.3%  Time Spent <=88% SpO2  Range (%) Time in range (min) Time in  range (%)  0.0 - 88.0 213.3 49.7%      Count Index  Desaturations 283 79.9    Cardiac Summary   Wake NREM REM Sleep Total  Average Pulse Rate (BPM) 84.5 82.0 93.1 84.0 84.2  Minimum Pulse Rate (BPM) 73.0 65.0 78.0 65.0 65.0  Maximum Pulse Rate (BPM) 106.0 98.0 104.0 104.0 106.0   Pulse Rate Distribution:  Range (bpm) Time in range (min) Time in range (%)  0.0 - 40.0 - -  40.0 - 60.0 - -  60.0 - 80.0 89.7 20.9%  80.0 - 100.0 322.6 75.1%  100.0 - 120.0 5.6 1.3%  120.0 - 140.0 - -  140.0 - 200.0 - -      Hypnograms                      Technologist Comments  THE 59-YEAR-OLD FEMALE PATIENT PRESENTED TO THE SLEEP DISORDER CENTER VIA WHEELCHAIR, FOR A SPLIT NIGHT STUDY WITH CHIEF COMPLAINTS OF SLEEP DISORDERED BREATHING. THE SPLIT NIGHT STUDY WAS EXPLAINED WITH ALL QUESTIONS ANSWERED. THE PATIENT WAS FITTED WITH A LARGE RESMED AIRFIT F40 FULL FACE MASK FOR PRE-CPAP TRIAL, WHICH WAS TOLERATED WELL. MEDICATIONS WERE SELF ADMINISTERED AT 0020 FOR BILATERAL KNEE PAIN. THE LEAD PLACEMENT WAS INITIATED, THEN THE STUDY WAS BEGUN. MODERATE AUDIBLE SNORING WAS NOTED THROUGHOUT THE STUDY. SUPPLEMENTAL OXYGEN WAS NOT WARRANTED DURING THE STUDY. NO PLMs - PLMAs WERE NOTED. TWO RESTROOM VISITS WERE MADE. QUESTIONABLE CARDIAC ARRHYTHMIAS WERE OBSERVED ON EPOCHS 385,  742 AND THROUGHOUT THE STUDY. NO OBVIOUS PARASOMNIAs WERE OBSERVED. SPLIT NIGHT CRITERIA WAS NOT MET DUE TO INSUFFICIENT AMOUNT OF SLEEP, THEREFORE A NPSG DIAGNOSTIC STUDY WAS CONTINUED. THE PATIENT TOLERATED THE NPSG STUDY WELL.

## 2024-05-30 ENCOUNTER — Encounter: Payer: Self-pay | Admitting: Cardiology

## 2024-05-30 ENCOUNTER — Ambulatory Visit: Attending: Cardiology | Admitting: Cardiology

## 2024-05-30 VITALS — BP 132/84 | HR 101 | Ht 62.0 in | Wt 303.4 lb

## 2024-05-30 DIAGNOSIS — E662 Morbid (severe) obesity with alveolar hypoventilation: Secondary | ICD-10-CM | POA: Diagnosis not present

## 2024-05-30 DIAGNOSIS — I480 Paroxysmal atrial fibrillation: Secondary | ICD-10-CM

## 2024-05-30 DIAGNOSIS — I5032 Chronic diastolic (congestive) heart failure: Secondary | ICD-10-CM | POA: Diagnosis not present

## 2024-05-30 NOTE — Progress Notes (Signed)
"  ° ° °  Cardiology Office Note  Date: 05/30/2024   ID: Emma Morris, DOB 1961/02/10, MRN [id number]  History of Present Illness: Emma Morris is a 64 y.o. female last seen in October 2025.  She is here for a routine visit.  Records indicate hospitalization with community-acquired pneumonia in December 2025.  I reviewed her workup.  High-sensitivity troponin T levels were minimally increased and pattern consistent with demand ischemia rather than ACS.  She maintained sinus rhythm.  She tells me that she has some chest congestion, but feels better, completed course of antibiotics and anticipates follow-up with Pulmonary.  We went over her medications.  She reports compliance with current diuretic regimen, no spontaneous bleeding problems on Eliquis .  Physical Exam: VS:  BP 132/84 (BP Location: Right Arm, Cuff Size: Large)   Pulse (!) 101   Ht 5' 2 (1.575 m)   Wt (!) 303 lb 6.4 oz (137.6 kg)   SpO2 91%   BMI 55.49 kg/m , BMI Body mass index is 55.49 kg/m.  Wt Readings from Last 3 Encounters:  05/30/24 (!) 303 lb 6.4 oz (137.6 kg)  05/22/24 300 lb (136.1 kg)  05/02/24 (!) 301 lb 2.4 oz (136.6 kg)    General: Patient appears comfortable at rest. HEENT: Conjunctiva and lids normal. Neck: Supple, no elevated JVP or carotid bruits. Lungs: Decreased breath sounds throughout, nonlabored breathing at rest. Cardiac: Regular rate and rhythm, no S3 or significant systolic murmur.  ECG:  An ECG dated 02/15/2024 was personally reviewed today and demonstrated:  Sinus rhythm with nonspecific T wave changes.  Labwork: 04/30/2024: ALT 7; AST 14; Pro Brain Natriuretic Peptide <50.0 05/01/2024: Hemoglobin 11.7; Platelets 234 05/02/2024: BUN 13; Creatinine, Ser 0.95; Magnesium 2.5; Potassium 3.9; Sodium 137   Other Studies Reviewed Today:  Echocardiogram 07/15/2023 [facility]): Summary   1. The left ventricle is normal in size with upper normal wall thickness.    2. The left  ventricular systolic function is normal, LVEF is visually  estimated at 60-65%.    3. Mitral annular calcification is present (mild).    4. The right ventricle is normal in size, with normal systolic function.    5. There is no pulmonary hypertension.   Assessment and Plan:  1.  Paroxysmal atrial fibrillation with CHA2DS2-VASc score of 3.  No palpitations in the interim and heart rate regular today.  Continue Eliquis  5 mg twice daily for stroke prophylaxis, she is also on diltiazem  CD100 and 80 mg daily.   2.  HFpEF.  LVEF 60 to 65% and RV contraction normal by echocardiogram through [facility] in February 2025.  Continue current regimen including Jardiance  10 mg daily, Aldactone  25 mg twice daily, and Lasix  20 mg daily with potassium supplement.   3.  Morbid obesity with OHS.  Keep follow-up with Pulmonary.  She has supplemental oxygen to use as needed at home.   4.  History of pulmonary embolus in July 2024.  Disposition:  Follow up 6 months.  Signed, Jayson JUDITHANN Sierras, M.D., F.A.C.C.  [facility] "

## 2024-05-30 NOTE — Patient Instructions (Signed)
 Medication Instructions:   Your physician recommends that you continue on your current medications as directed. Please refer to the Current Medication list given to you today.   Labwork: None today  Testing/Procedures: None today  Follow-Up: 6 months Dr.McDowell  Any Other Special Instructions Will Be Listed Below (If Applicable).  If you need a refill on your cardiac medications before your next appointment, please call your pharmacy.

## 2024-05-31 NOTE — Telephone Encounter (Signed)
ATC X1

## 2024-06-01 NOTE — Telephone Encounter (Signed)
 LVM for patient to call and schedule follo wup with D.r Alghanim

## 2024-06-04 NOTE — Telephone Encounter (Signed)
 Spoke with patient regarding the Monday 07/30/24 8:00 pm CPAP titration study at [facility] lab---arrival time is 7:30 PM for check in---follow up with Dr. Catherine is scheduled for Tuesday 08/21/24 at 1:45 pm.  Will mail information to patient and she voiced her understanding

## 2024-06-14 ENCOUNTER — Telehealth: Payer: Self-pay | Admitting: Orthopedic Surgery

## 2024-06-14 NOTE — Telephone Encounter (Signed)
 Dr. Onesimo pt - [facility] Coordinator w/[facility] [phone] lvm stating the patient would like an update regarding her rollator walker.  She asked that we give the pt a call at [phone] or you can call her.

## 2024-06-15 NOTE — Telephone Encounter (Signed)
 Called pt to see if she would like for us  to send another request for rollator. No answer VM left with callback information.

## 2024-06-19 NOTE — Telephone Encounter (Signed)
 Called and left VM with Therisa regarding pt. Let her know pt was given a script for walker and that I've tried to reach pt with no success. Callback information left for questions or concerns.

## 2024-06-22 ENCOUNTER — Other Ambulatory Visit: Payer: Self-pay | Admitting: Orthopedic Surgery

## 2024-07-03 ENCOUNTER — Ambulatory Visit: Admitting: Orthopedic Surgery

## 2024-07-04 ENCOUNTER — Ambulatory Visit: Admitting: Orthopedic Surgery

## 2024-07-30 ENCOUNTER — Ambulatory Visit (HOSPITAL_BASED_OUTPATIENT_CLINIC_OR_DEPARTMENT_OTHER): Admitting: Pulmonary Disease

## 2024-08-21 ENCOUNTER — Ambulatory Visit: Admitting: Pulmonary Disease
